# Patient Record
Sex: Male | Born: 1940 | Race: White | Hispanic: No | Marital: Single | Smoking: Former smoker
Health system: Southern US, Community
[De-identification: ages and names within clinical notes are randomized; demographics above are authoritative.]

## PROBLEM LIST (undated history)

## (undated) DIAGNOSIS — I639 Cerebral infarction, unspecified: Secondary | ICD-10-CM

## (undated) DIAGNOSIS — I10 Essential (primary) hypertension: Secondary | ICD-10-CM

## (undated) DIAGNOSIS — E785 Hyperlipidemia, unspecified: Secondary | ICD-10-CM

## (undated) DIAGNOSIS — E119 Type 2 diabetes mellitus without complications: Secondary | ICD-10-CM

## (undated) HISTORY — DX: Essential (primary) hypertension: I10

## (undated) HISTORY — DX: Hyperlipidemia, unspecified: E78.5

## (undated) HISTORY — DX: Cerebral infarction, unspecified: I63.9

---

## 1968-03-23 DIAGNOSIS — I639 Cerebral infarction, unspecified: Secondary | ICD-10-CM

## 1968-03-23 HISTORY — DX: Cerebral infarction, unspecified: I63.9

## 1978-03-23 HISTORY — PX: APPENDECTOMY: SHX54

## 2004-10-21 ENCOUNTER — Ambulatory Visit: Payer: Self-pay | Admitting: Gastroenterology

## 2004-10-30 ENCOUNTER — Ambulatory Visit: Payer: Self-pay | Admitting: Gastroenterology

## 2006-10-28 ENCOUNTER — Ambulatory Visit (HOSPITAL_BASED_OUTPATIENT_CLINIC_OR_DEPARTMENT_OTHER): Admission: RE | Admit: 2006-10-28 | Discharge: 2006-10-28 | Payer: Self-pay | Admitting: General Surgery

## 2006-10-28 ENCOUNTER — Encounter (INDEPENDENT_AMBULATORY_CARE_PROVIDER_SITE_OTHER): Payer: Self-pay | Admitting: General Surgery

## 2008-11-06 ENCOUNTER — Encounter: Payer: Self-pay | Admitting: Cardiovascular Disease

## 2008-11-13 ENCOUNTER — Encounter: Payer: Self-pay | Admitting: Cardiovascular Disease

## 2008-11-15 ENCOUNTER — Ambulatory Visit: Payer: Self-pay | Admitting: Cardiovascular Disease

## 2008-11-15 DIAGNOSIS — E782 Mixed hyperlipidemia: Secondary | ICD-10-CM

## 2008-11-15 DIAGNOSIS — R079 Chest pain, unspecified: Secondary | ICD-10-CM

## 2008-11-15 DIAGNOSIS — I10 Essential (primary) hypertension: Secondary | ICD-10-CM | POA: Insufficient documentation

## 2008-11-30 ENCOUNTER — Encounter: Payer: Self-pay | Admitting: Cardiovascular Disease

## 2008-11-30 ENCOUNTER — Ambulatory Visit: Payer: Self-pay

## 2008-12-14 ENCOUNTER — Encounter (INDEPENDENT_AMBULATORY_CARE_PROVIDER_SITE_OTHER): Payer: Self-pay | Admitting: *Deleted

## 2009-09-13 ENCOUNTER — Encounter (INDEPENDENT_AMBULATORY_CARE_PROVIDER_SITE_OTHER): Payer: Self-pay | Admitting: *Deleted

## 2009-09-18 ENCOUNTER — Encounter (INDEPENDENT_AMBULATORY_CARE_PROVIDER_SITE_OTHER): Payer: Self-pay | Admitting: *Deleted

## 2009-10-30 ENCOUNTER — Encounter (INDEPENDENT_AMBULATORY_CARE_PROVIDER_SITE_OTHER): Payer: Self-pay | Admitting: *Deleted

## 2009-11-01 ENCOUNTER — Ambulatory Visit: Payer: Self-pay | Admitting: Gastroenterology

## 2009-11-15 ENCOUNTER — Ambulatory Visit: Payer: Self-pay | Admitting: Gastroenterology

## 2009-11-18 ENCOUNTER — Encounter: Payer: Self-pay | Admitting: Gastroenterology

## 2010-04-22 NOTE — Letter (Signed)
Summary: Patient Notice- Polyp Results  [facility]  [address] [place], [place] [address]   Phone: [phone]  Fax: [phone]        November 18, 2009 MRN: [id number]    [facility] [address] [place], [place]  [address]    Dear Mr. BOLLIER,  I am pleased to inform you that the colon polyp(s) removed during your recent colonoscopy was (were) found to be benign (no cancer detected) upon pathologic examination.  I recommend you have a repeat colonoscopy examination in 5 years to look for recurrent polyps, as having colon polyps increases your risk for having recurrent polyps or even colon cancer in the future.  Should you develop new or worsening symptoms of abdominal pain, bowel habit changes or bleeding from the rectum or bowels, please schedule an evaluation with either your primary care physician or with me.  Continue treatment plan as outlined the day of your exam.  Please call us if you are having persistent problems or have questions about your condition that have not been fully answered at this time.  Sincerely,  Meryl Dare MD [facility]  This letter has been electronically signed by your physician.  Appended Document: Patient Notice- Polyp Results letter mailed 9.2.11

## 2010-04-22 NOTE — Letter (Signed)
Summary: [facility] Instructions  [facility]  [address] [place], [place] [address]   Phone: [phone]  Fax: [phone]       Steve Maldonado    [birth year]    MRN: [id number]        Procedure Day /Date:  Friday 11/15/2009     Arrival Time: 8:30 am      Procedure Time: 9:30 am     Location of Procedure:                    _x _  [facility] (4th Floor)                        PREPARATION FOR COLONOSCOPY WITH MOVIPREP   Starting 5 days prior to your procedure Sunday 8/21 do not eat nuts, seeds, popcorn, corn, beans, peas,  salads, or any raw vegetables.  Do not take any fiber supplements (e.g. Metamucil, Citrucel, and Benefiber).  THE DAY BEFORE YOUR PROCEDURE         DATE: Thursday 8/25  1.  Drink clear liquids the entire day-NO SOLID FOOD  2.  Do not drink anything colored red or purple.  Avoid juices with pulp.  No orange juice.  3.  Drink at least 64 oz. (8 glasses) of fluid/clear liquids during the day to prevent dehydration and help the prep work efficiently.  CLEAR LIQUIDS INCLUDE: Water Jello Ice Popsicles Tea (sugar ok, no milk/cream) Powdered fruit flavored drinks Coffee (sugar ok, no milk/cream) Gatorade Juice: apple, white grape, white cranberry  Lemonade Clear bullion, consomm, broth Carbonated beverages (any kind) Strained chicken noodle soup Hard Candy                             4.  In the morning, mix first dose of MoviPrep solution:    Empty 1 Pouch A and 1 Pouch B into the disposable container    Add lukewarm drinking water to the top line of the container. Mix to dissolve    Refrigerate (mixed solution should be used within 24 hrs)  5.  Begin drinking the prep at 5:00 p.m. The MoviPrep container is divided by 4 marks.   Every 15 minutes drink the solution down to the next mark (approximately 8 oz) until the full liter is complete.   6.  Follow completed prep with 16 oz of clear liquid of your choice (Nothing red  or purple).  Continue to drink clear liquids until bedtime.  7.  Before going to bed, mix second dose of MoviPrep solution:    Empty 1 Pouch A and 1 Pouch B into the disposable container    Add lukewarm drinking water to the top line of the container. Mix to dissolve    Refrigerate  THE DAY OF YOUR PROCEDURE      DATE: Friday 8/26  Beginning at 4:30 a.m. (5 hours before procedure):         1. Every 15 minutes, drink the solution down to the next mark (approx 8 oz) until the full liter is complete.  2. Follow completed prep with 16 oz. of clear liquid of your choice.    3. You may drink clear liquids until 7:30 am (2 HOURS BEFORE PROCEDURE).   MEDICATION INSTRUCTIONS  Unless otherwise instructed, you should take regular prescription medications with a small sip of water   as early as possible the morning of  your procedure.         OTHER INSTRUCTIONS  You will need a responsible adult at least 70 years of age to accompany you and drive you home.   This person must remain in the waiting room during your procedure.  Wear loose fitting clothing that is easily removed.  Leave jewelry and other valuables at home.  However, you may wish to bring a book to read or  an iPod/MP3 player to listen to music as you wait for your procedure to start.  Remove all body piercing jewelry and leave at home.  Total time from sign-in until discharge is approximately 2-3 hours.  You should go home directly after your procedure and rest.  You can resume normal activities the  day after your procedure.  The day of your procedure you should not:   Drive   Make legal decisions   Operate machinery   Drink alcohol   Return to work  You will receive specific instructions about eating, activities and medications before you leave.    The above instructions have been reviewed and explained to me by   _______________________    I fully understand and can verbalize these instructions  _____________________________ Date _________

## 2010-04-22 NOTE — Procedures (Signed)
Summary: Colonoscopy  Patient: Steve Maldonado Note: All result statuses are Final unless otherwise noted.  Tests: (1) Colonoscopy (COL)   COL Colonoscopy           DONE     [facility]     [address]     [place], [place]  [address]           COLONOSCOPY PROCEDURE REPORT           PATIENT:  Steve Maldonado, Steve Maldonado  MR#:  [id number]     BIRTHDATE:  [birth year], 69 yrs. old  GENDER:  male     ENDOSCOPIST:  Judie Petit T. Russella Dar, MD, [facility]           PROCEDURE DATE:  11/15/2009     PROCEDURE:  Colonoscopy with snare polypectomy     ASA CLASS:  Class II     INDICATIONS:  1) surveillance and high-risk screening  2)     follow-up of polyp, adenomatous polyp, 09/2001.     MEDICATIONS:   Fentanyl 75 mcg IV, Versed 8 mg IV     DESCRIPTION OF PROCEDURE:   After the risks benefits and     alternatives of the procedure were thoroughly explained, informed     consent was obtained.  Digital rectal exam was performed and     revealed no abnormalities.   The LB [id number]-[id number] endoscope     was introduced through the anus and advanced to the cecum, which     was identified by both the appendix and ileocecal valve, without     limitations.  The quality of the prep was excellent, using     MoviPrep.  The instrument was then slowly withdrawn as the colon     was fully examined.     <<PROCEDUREIMAGES>>     FINDINGS:  A sessile polyp was found in the distal transverse     colon. It was 6 mm in size. Polyp was snared without cautery.     Retrieval was successful. Mild diverticulosis was found in the     sigmoid colon. A normal appearing cecum, ileocecal valve, and     appendiceal orifice were identified. The ascending, hepatic     flexure, splenic flexure, descending colon, and rectum appeared     unremarkable. Retroflexed views in the rectum revealed internal     hemorrhoids, small.  The time to cecum =  4.75  minutes. The scope     was then withdrawn (time =  10.25  min) from the patient and the  procedure completed.           COMPLICATIONS:  None           ENDOSCOPIC IMPRESSION:     1) 6 mm sessile polyp in the distal transverse colon     2) Mild diverticulosis in the sigmoid colon     3) Internal hemorrhoids           RECOMMENDATIONS:     1) Await pathology results     2) High fiber diet with liberal fluid intake     3) Repeat Colonoscopy in 5 years pending pathology review           Malcolm T. Russella Dar, MD, [facility]           CC: Geoffry Paradise, MD           n.     Rosalie DoctorVenita Lick. Stark at 11/15/2009 10:11 AM  Steve Maldonado, Steve Maldonado, [id number]  Note: An exclamation mark (!) indicates a result that was not dispersed into the flowsheet. Document Creation Date: 11/15/2009 10:12 AM _______________________________________________________________________  (1) Order result status: Final Collection or observation date-time: 11/15/2009 10:03 Requested date-time:  Receipt date-time:  Reported date-time:  Referring Physician:   Ordering Physician: Claudette Head [phone]) Specimen Source:  Source: Launa Grill Order Number: [id number] Lab site:   Appended Document: Colonoscopy     Procedures Next Due Date:    Colonoscopy: 10/2014

## 2010-04-22 NOTE — Letter (Signed)
Summary: Colonoscopy Letter  [facility]  [address] [place], [place] [address]   Phone: [phone]  Fax: [phone]      September 13, 2009 MRN: [id number]   Steve Maldonado [address] [place], [place]  [address]   Dear [name],   According to your medical record, it is time for you to schedule a Colonoscopy. The American Cancer Society recommends this procedure as a method to detect early colon cancer. Patients with a family history of colon cancer, or a personal history of colon polyps or inflammatory bowel disease are at increased risk.  This letter has beeen generated based on the recommendations made at the time of your procedure. If you feel that in your particular situation this may no longer apply, please contact our office.  Please call our office at [phone] to schedule this appointment or to update your records at your earliest convenience.  Thank you for cooperating with [place] to provide you with the very best care possible.   Sincerely,  Judie Petit T. Russella Dar, M.D.  [facility] [phone]

## 2010-04-22 NOTE — Miscellaneous (Signed)
Summary: [facility]  Clinical Lists Changes  Medications: Added new medication of MOVIPREP 100 GM  SOLR (PEG-KCL-NACL-NASULF-NA ASC-C) As per prep instructions. - Signed Rx of MOVIPREP 100 GM  SOLR (PEG-KCL-NACL-NASULF-NA ASC-C) As per prep instructions.;  #1 x 0;  Signed;  Entered by: Ezra Sites RN;  Authorized by: Meryl Dare MD [facility];  Method used: Print then Give to Patient Observations: Added new observation of NKA: T (11/01/2009 8:09)    Prescriptions: MOVIPREP 100 GM  SOLR (PEG-KCL-NACL-NASULF-NA ASC-C) As per prep instructions.  #1 x 0   Entered by:   Ezra Sites RN   Authorized by:   Meryl Dare MD [facility]   Signed by:   Ezra Sites RN on 11/01/2009   Method used:   Print then Give to Patient   RxID:   [id number]

## 2010-04-22 NOTE — Letter (Signed)
Summary: Previsit letter  [facility]  [address] [place], [place] [address]   Phone: [phone]  Fax: [phone]       09/18/2009 MRN: [id number]  Steve Maldonado [address] [place], [place]  [address]  Dear [name],  Welcome to the Gastroenterology Division at [facility].    You are scheduled to see a nurse for your pre-procedure visit on 11-01-09 at 8:30a.m. on the 3rd floor at [facility], [address]  We ask that you try to arrive at our office 15 minutes prior to your appointment time to allow for check-in.  Your nurse visit will consist of discussing your medical and surgical history, your immediate family medical history, and your medications.    Please bring a complete list of all your medications or, if you prefer, bring the medication bottles and we will list them.  We will need to be aware of both prescribed and over the counter drugs.  We will need to know exact dosage information as well.  If you are on blood thinners (Coumadin, Plavix, Aggrenox, Ticlid, etc.) please call our office today/prior to your appointment, as we need to consult with your physician about holding your medication.   Please be prepared to read and sign documents such as consent forms, a financial agreement, and acknowledgement forms.  If necessary, and with your consent, a friend or relative is welcome to sit-in on the nurse visit with you.  Please bring your insurance card so that we may make a copy of it.  If your insurance requires a referral to see a specialist, please bring your referral form from your primary care physician.  No co-pay is required for this nurse visit.     If you cannot keep your appointment, please call [phone] to cancel or reschedule prior to your appointment date.  This allows [place] the opportunity to schedule an appointment for another patient in need of care.    Thank you for choosing  Gastroenterology for your medical needs.  We  appreciate the opportunity to care for you.  Please visit [place] at our website  to learn more about our practice.                     Sincerely.                                                                                                                   The Gastroenterology Division

## 2010-08-05 NOTE — Op Note (Signed)
NAMECARLITO, Steve Maldonado                 ACCOUNT NO.:  [id number]   MEDICAL RECORD NO.:  [id number]          PATIENT TYPE:  AMB   LOCATION:  DSC                          FACILITY:  [facility]   PHYSICIAN:  Gabrielle Dare. Janee Morn, M.D.DATE OF BIRTH:  Aug 03, 1940   DATE OF PROCEDURE:  10/28/2006  DATE OF DISCHARGE:                               OPERATIVE REPORT   PREOPERATIVE DIAGNOSIS:  Nevus, left scalp.   POSTOPERATIVE DIAGNOSIS:  Nevus, left scalp.   PROCEDURE:  Excision of nevus, left scalp.   SURGEON:  Gabrielle Dare. Janee Morn, MD   ANESTHESIA:  MAC.   HISTORY OF PRESENT ILLNESS:  [name] is a 70 year old gentleman who I  evaluated in the office for a 1.5-cm nevus on his left scalp.  He  presents today for elective excision.   PROCEDURE IN DETAIL:  Informed consent was obtained.  The patient's site  was marked.  He received intravenous antibiotics.  He was brought to the  operating room.  MAC anesthesia was administered by the anesthesia  staff.  His hair was clipped around the nevus.  The area was prepped and  draped in a sterile fashion.  First the area was infiltrated with a  mixture of 1% lidocaine with epinephrine and 0.25% Marcaine.  An  elliptical incision was then made to encompass the entire nevus.  The  subcutaneous tissues were dissected down and the nevus was excised in  one piece.  It was oriented with sutures for pathology and sent fresh.  The wound was copiously irrigated.  Some additional local anesthetic was  injected.  Meticulous hemostasis was obtained with the Bovie cautery.  The wound was then closed primarily with interrupted 3-0 nylon sutures.  Excellent hemostasis was obtained.  The patient tolerated the procedure  well without apparent complication and was taken to the recovery room in  stable condition.      Gabrielle Dare Janee Morn, M.D.  Electronically Signed     BET/MEDQ  D:  10/28/2006  T:  10/28/2006  Job:  [id number]   cc:   Geoffry Paradise, M.D.

## 2011-01-05 LAB — POCT HEMOGLOBIN-HEMACUE
Hemoglobin: 14.8
Operator id: 208731

## 2013-06-08 ENCOUNTER — Ambulatory Visit (INDEPENDENT_AMBULATORY_CARE_PROVIDER_SITE_OTHER)
Admission: RE | Admit: 2013-06-08 | Discharge: 2013-06-08 | Disposition: A | Discharge status: Home / Self Care | Payer: Medicare Other | Source: Ambulatory Visit | Attending: Internal Medicine | Admitting: Internal Medicine

## 2013-06-08 ENCOUNTER — Encounter: Payer: Self-pay | Admitting: Internal Medicine

## 2013-06-08 ENCOUNTER — Ambulatory Visit (INDEPENDENT_AMBULATORY_CARE_PROVIDER_SITE_OTHER): Payer: Medicare Other | Admitting: Internal Medicine

## 2013-06-08 VITALS — BP 130/86 | HR 75 | Temp 98.3°F | Ht 75.0 in | Wt 194.0 lb

## 2013-06-08 DIAGNOSIS — R059 Cough, unspecified: Secondary | ICD-10-CM

## 2013-06-08 DIAGNOSIS — R05 Cough: Secondary | ICD-10-CM

## 2013-06-08 DIAGNOSIS — R058 Other specified cough: Secondary | ICD-10-CM | POA: Insufficient documentation

## 2013-06-08 MED ORDER — PREDNISONE 10 MG PO TABS: ORAL_TABLET | ORAL | Status: DC

## 2013-06-08 MED ORDER — TRAMADOL HCL 50 MG PO TABS: ORAL_TABLET | ORAL | Status: DC

## 2013-06-08 NOTE — Patient Instructions (Signed)
Try prilosec 20mg   Take 30-60 min before first meal of the day and Pepcid 20 mg one bedtime until cough is completely gone for at least a week without the need for cough suppression    Take delsym (otc)  two tsp every 12 hours and supplement if needed with  tramadol 50 mg up to 1 every 4 hours to suppress the urge to cough. Swallowing water or using ice chips/non mint and menthol containing candies (such as lifesavers or sugarless jolly ranchers) are also effective.  You should rest your voice and avoid activities that you know make you cough.  Once you have eliminated the cough for 3 straight days try reducing the tramadol first,  then the delsym as tolerated.    Prednisone 10 mg take  4 each am x 2 days,   2 each am x 2 days,  1 each am x 2 days and stop   GERD (REFLUX)  is an extremely common cause of respiratory symptoms, many times with no significant heartburn at all.    It can be treated with medication, but also with lifestyle changes including avoidance of late meals, excessive alcohol, smoking cessation, and avoid fatty foods, chocolate, peppermint, colas, red wine, and acidic juices such as orange juice.  NO MINT OR MENTHOL PRODUCTS SO NO COUGH DROPS  USE SUGARLESS CANDY INSTEAD (jolley ranchers or Stover's)  NO OIL BASED VITAMINS - use powdered substitutes.    Please remember to go to the x-ray department downstairs for your tests - we will call you with the results when they are available.

## 2013-06-08 NOTE — Assessment & Plan Note (Signed)
Explained natural history of uri and why it's necessary in patients at risk to treat GERD aggressively  at least  short term   to reduce risk of evolving cyclical cough initially  triggered by epithelial injury and a heightened sensitivty to the effects of any upper airway irritants,  most importantly acid - related.  That is, the more sensitive the epithelium damaged for virus, the more the cough, the more the secondary reflux (especially in those prone to reflux) the more the irritation of the sensitive mucosa and so on in a cyclical pattern.  See instructions for specific recommendations which were reviewed directly with the patient who was given a copy with highlighter outlining the key components.  

## 2013-06-08 NOTE — Progress Notes (Signed)
Subjective:    Patient ID: Steve Maldonado, male    DOB: Dec 08, 1940  MRN: [id number]  HPI   37 yowm quit smoking 1980 no resp problems until around 1st of year 2015 bad cold otc never saw a doctor but the cough persisted and referred 06/08/2013 by his neighber, Dr Rebekah Chesterfield to pulmonary clinic.    06/08/2013 1st [facility] office visit/ Steve Maldonado  Chief Complaint  Patient presents with  . Pulmonary Consult    Self referral- pt c/o cough since Jan 2014-prod with minimal clear sputum.   cough acutely sick with cold Jan 2015 never really productive starts up after stirs, sometimes wakes him up but usually not.       Kouffman Reflux v Neurogenic Cough Differentiator Reflux Comments  Do you awaken from a sound sleep coughing violently?                            With trouble breathing? no   Do you have choking episodes when you cannot  Get enough air, gasping for air ?              no   Do you usually cough when you lie down into  The bed, or when you just lie down to rest ?                          no   Do you usually cough after meals or eating?         no   Do you cough when (or after) you bend over?    no   GERD SCORE     Kouffman Reflux v Neurogenic Cough Differentiator Neurogenic   Do you more-or-less cough all day long? yes   Does change of temperature make you cough? no   Does laughing or chuckling cause you to cough? no   Do fumes (perfume, automobile fumes, burned  Toast, etc.,) cause you to cough ?      no   Does speaking, singing, or talking on the phone cause you to cough   ?               No    Neurogenic/Airway score      No obvious other patterns in day to day or daytime variabilty or assoc sob or cp or chest tightness, subjective wheeze overt sinus or hb symptoms. No unusual exp hx or h/o childhood pna/ asthma or knowledge of premature birth.  Sleeping ok without nocturnal  or early am exacerbation  of respiratory  c/o's or need for noct saba. Also denies any obvious  fluctuation of symptoms with weather or environmental changes or other aggravating or alleviating factors except as outlined above   Current Medications, Allergies, Complete Past Medical History, Past Surgical History, Family History, and Social History were reviewed in [employer] record.          Review of Systems  Constitutional: Negative for fever, chills, activity change, appetite change and unexpected weight change.  HENT: Negative for congestion, dental problem, postnasal drip, rhinorrhea, sneezing, sore throat, trouble swallowing and voice change.   Eyes: Negative for visual disturbance.  Respiratory: Positive for cough and shortness of breath. Negative for choking.   Cardiovascular: Negative for chest pain and leg swelling.  Gastrointestinal: Negative for nausea, vomiting and abdominal pain.  Genitourinary: Negative for difficulty urinating.  Musculoskeletal: Negative for arthralgias.  Skin: Negative for rash.  Psychiatric/Behavioral: Negative for behavioral problems and confusion.       Objective:   Physical Exam  amb wm nad  Wt Readings from Last 3 Encounters:  06/08/13 194 lb (87.998 kg)  11/15/08 201 lb (91.173 kg)     HEENT: nl dentition, turbinates, and orophanx. Nl external ear canals without cough reflex   NECK :  without JVD/Nodes/TM/ nl carotid upstrokes bilaterally   LUNGS: no acc muscle use, clear to A and P bilaterally without cough on insp or exp maneuvers   CV:  RRR  no s3 or murmur or increase in P2, no edema   ABD:  soft and nontender with nl excursion in the supine position. No bruits or organomegaly, bowel sounds nl  MS:  warm without deformities, calf tenderness, cyanosis or clubbing  SKIN: warm and dry without lesions    NEURO:  alert, approp, no deficits      CXR  06/08/2013 :  1. Subtle accentuation of the interstitial markings of both lung bases laterally which could represent interstitial lung disease. 2.  Hyperinflated lungs which could represent emphysema. My review : nsa       Assessment & Plan:

## 2013-06-09 NOTE — Progress Notes (Signed)
Quick Note:  LMTCB ______ 

## 2013-06-09 NOTE — Progress Notes (Signed)
Quick Note:  Spoke with pt and notified of results per Dr. Wert. Pt verbalized understanding and denied any questions.  ______ 

## 2014-05-21 ENCOUNTER — Encounter: Payer: Self-pay | Admitting: Internal Medicine

## 2014-05-21 ENCOUNTER — Ambulatory Visit (INDEPENDENT_AMBULATORY_CARE_PROVIDER_SITE_OTHER): Payer: Medicare Other | Admitting: Internal Medicine

## 2014-05-21 VITALS — BP 138/88 | HR 84 | Ht 75.0 in | Wt 198.0 lb

## 2014-05-21 DIAGNOSIS — R058 Other specified cough: Secondary | ICD-10-CM

## 2014-05-21 DIAGNOSIS — R05 Cough: Secondary | ICD-10-CM

## 2014-05-21 MED ORDER — TRAMADOL HCL 50 MG PO TABS: ORAL_TABLET | ORAL | Status: DC

## 2014-05-21 MED ORDER — PREDNISONE 10 MG PO TABS: ORAL_TABLET | ORAL | Status: DC

## 2014-05-21 NOTE — Assessment & Plan Note (Signed)
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  Explained the natural history of uri and why it's necessary in patients at risk to treat GERD aggressively - at least  short term -   to reduce risk of evolving cyclical cough initially  triggered by epithelial injury and a heightened sensitivty to the effects of any upper airway irritants,  most importantly acid - related - then perpetuated by epithelial injury related to the cough itself as the upper airway collapses on itself.  That is, the more sensitive the epithelium becomes once it is damaged by the virus, the more the ensuing irritability> the more the cough, the more the secondary reflux (especially in those prone to reflux) the more the irritation of the sensitive mucosa and so on in a  Classic cyclical pattern.    See instructions for specific recommendations which were reviewed directly with the patient who was given a copy with highlighter outlining the key components.

## 2014-05-21 NOTE — Progress Notes (Signed)
Subjective:    Patient ID: Steve Maldonado, male    DOB: [birth year]  MRN: [id number]     Brief patient profile:  83 yowm quit smoking 1980 no resp problems until around 1st of year 2015 bad cold otc never saw a doctor but the cough persisted and referred 06/08/2013 by his neighber, Dr Rebekah Chesterfield to pulmonary clinic.    06/08/2013 1st [facility] office visit/ Lucelia Lacey  Chief Complaint  Patient presents with  . Pulmonary Consult    Self referral- pt c/o cough since Jan 2014-prod with minimal clear sputum.   cough acutely sick with cold Jan 2015 never really productive starts up after stirs, sometimes wakes him up but usually not.   rec Try prilosec 20mg   Take 30-60 min before first meal of the day and Pepcid 20 mg one bedtime until cough is completely gone for at least a week without the need for cough suppression Take delsym (otc)  two tsp every 12 hours and supplement if needed with  tramadol 50 mg up to 1 every 4 hours to suppress the urge to cough. Swallowing water or using ice chips/non mint and menthol containing candies (such as lifesavers or sugarless jolly ranchers) are also effective.  You should rest your voice and avoid activities that you know make you cough. Once you have eliminated the cough for 3 straight days try reducing the tramadol first,  then the delsym as tolerated.   Prednisone 10 mg take  4 each am x 2 days,   2 each am x 2 days,  1 each am x 2 days and stop  GERD diet   05/21/2014 f/u ov/Saloni Lablanc re: recurrent cough  Chief Complaint  Patient presents with  . Acute Visit    Pt c/o increased cough x 10 days- occ prod with minimal white sputum.  He also c/o SOB occ and notices "chest rattle".    100% improved p last ov then typical uri with sinus congestion/ improved s abx but cough lingering just like before  Cough worse p asleep  No obvious day to day or daytime variabilty or assoc sob  or cp or chest tightness, subjective wheeze overt  or hb symptoms. No unusual exp hx  or h/o childhood pna/ asthma or knowledge of premature birth.  Sleeping ok without nocturnal  or early am exacerbation  of respiratory  c/o's or need for noct saba. Also denies any obvious fluctuation of symptoms with weather or environmental changes or other aggravating or alleviating factors except as outlined above   Current Medications, Allergies, Complete Past Medical History, Past Surgical History, Family History, and Social History were reviewed in [employer] record.  ROS  The following are not active complaints unless bolded sore throat, dysphagia, dental problems, itching, sneezing,  nasal congestion or excess/ purulent secretions, ear ache,   fever, chills, sweats, unintended wt loss, pleuritic or exertional cp, hemoptysis,  orthopnea pnd or leg swelling, presyncope, palpitations, heartburn, abdominal pain, anorexia, nausea, vomiting, diarrhea  or change in bowel or urinary habits, change in stools or urine, dysuria,hematuria,  rash, arthralgias, visual complaints, headache, numbness weakness or ataxia or problems with walking or coordination,  change in mood/affect or memory.                         Objective:   Physical Exam  amb wm nad  Wt Readings from Last 3 Encounters:  05/21/14 198 lb (89.812 kg)  06/08/13 194  lb (87.998 kg)  11/15/08 201 lb (91.173 kg)    Vital signs reviewed     HEENT: nl dentition, turbinates, and orophanx. Nl external ear canals without cough reflex   NECK :  without JVD/Nodes/TM/ nl carotid upstrokes bilaterally   LUNGS: no acc muscle use, clear to A and P bilaterally without cough on insp or exp maneuvers   CV:  RRR  no s3 or murmur or increase in P2, no edema   ABD:  soft and nontender with nl excursion in the supine position. No bruits or organomegaly, bowel sounds nl  MS:  warm without deformities, calf tenderness, cyanosis or clubbing  SKIN: warm and dry without lesions    NEURO:  alert, approp, no  deficits      CXR  06/08/2013 :  1. Subtle accentuation of the interstitial markings of both lung bases laterally which could represent interstitial lung disease. 2. Hyperinflated lungs which could represent emphysema. My review : nsa       Assessment & Plan:

## 2014-05-21 NOTE — Patient Instructions (Addendum)
Try prilosec 20mg   Take 30-60 min before first meal of the day and Pepcid 20 mg one bedtime until cough is completely gone for at least a week without the need for cough suppression (both are over the counter)    Take delsym (otc)  two tsp every 12 hours and supplement if needed with  tramadol 50 mg up to 1 every 4 hours to suppress the urge to cough. Swallowing water or using ice chips/non mint and menthol containing candies (such as lifesavers or sugarless jolly ranchers) are also effective.  You should rest your voice and avoid activities that you know make you cough.  Once you have eliminated the cough for 3 straight days try reducing the tramadol first,  then the delsym as tolerated.    Prednisone 10 mg take  4 each am x 2 days,   2 each am x 2 days,  1 each am x 2 days and stop (called in)   GERD (REFLUX)  is an extremely common cause of respiratory symptoms, many times with no significant heartburn at all.    It can be treated with medication, but also with lifestyle changes including avoidance of late meals, excessive alcohol, smoking cessation, and avoid fatty foods, chocolate, peppermint, colas, red wine, and acidic juices such as orange juice.  NO MINT OR MENTHOL PRODUCTS SO NO COUGH DROPS  USE SUGARLESS CANDY INSTEAD (jolley ranchers or Stover's)  NO OIL BASED VITAMINS - use powdered substitutes.    If not 100% better in 2 weeks return to clinic

## 2014-09-17 ENCOUNTER — Encounter: Payer: Self-pay | Admitting: Gastroenterology

## 2014-09-20 ENCOUNTER — Encounter: Payer: Self-pay | Admitting: Gastroenterology

## 2014-10-25 ENCOUNTER — Encounter: Payer: Self-pay | Admitting: Gastroenterology

## 2014-11-15 ENCOUNTER — Ambulatory Visit (AMBULATORY_SURGERY_CENTER): Payer: Self-pay | Admitting: *Deleted

## 2014-11-15 VITALS — Ht 75.0 in | Wt 192.0 lb

## 2014-11-15 DIAGNOSIS — Z86010 Personal history of colonic polyps: Secondary | ICD-10-CM

## 2014-11-15 MED ORDER — NA SULFATE-K SULFATE-MG SULF 17.5-3.13-1.6 GM/177ML PO SOLN: ORAL | Status: DC

## 2014-11-15 NOTE — Progress Notes (Signed)
No allergies to eggs or soy. No problems with anesthesia.  Pt given Emmi instructions for colonoscopy  No oxygen use  No diet drug use  

## 2014-11-29 ENCOUNTER — Ambulatory Visit (AMBULATORY_SURGERY_CENTER): Payer: Medicare Other | Admitting: Gastroenterology

## 2014-11-29 ENCOUNTER — Encounter: Payer: Self-pay | Admitting: Gastroenterology

## 2014-11-29 VITALS — BP 132/47 | HR 60 | Temp 95.9°F | Resp 16 | Ht 75.0 in | Wt 192.0 lb

## 2014-11-29 DIAGNOSIS — Z86010 Personal history of colonic polyps: Secondary | ICD-10-CM | POA: Diagnosis present

## 2014-11-29 DIAGNOSIS — D123 Benign neoplasm of transverse colon: Secondary | ICD-10-CM | POA: Diagnosis not present

## 2014-11-29 MED ORDER — SODIUM CHLORIDE 0.9 % IV SOLN
500.0000 mL | INTRAVENOUS | Status: DC
Start: 2014-11-29 — End: 2014-11-29

## 2014-11-29 NOTE — Patient Instructions (Signed)
YOU HAD AN ENDOSCOPIC PROCEDURE TODAY AT THE [facility]:   Refer to the procedure report that was given to you for any specific questions about what was found during the examination.  If the procedure report does not answer your questions, please call your gastroenterologist to clarify.  If you requested that your care partner not be given the details of your procedure findings, then the procedure report has been included in a sealed envelope for you to review at your convenience later.  YOU SHOULD EXPECT: Some feelings of bloating in the abdomen. Passage of more gas than usual.  Walking can help get rid of the air that was put into your GI tract during the procedure and reduce the bloating. If you had a lower endoscopy (such as a colonoscopy or flexible sigmoidoscopy) you may notice spotting of blood in your stool or on the toilet paper. If you underwent a bowel prep for your procedure, you may not have a normal bowel movement for a few days.  Please Note:  You might notice some irritation and congestion in your nose or some drainage.  This is from the oxygen used during your procedure.  There is no need for concern and it should clear up in a day or so.  SYMPTOMS TO REPORT IMMEDIATELY:   Following lower endoscopy (colonoscopy or flexible sigmoidoscopy):  Excessive amounts of blood in the stool  Significant tenderness or worsening of abdominal pains  Swelling of the abdomen that is new, acute  Fever of 100F or higher   Following upper endoscopy (EGD)  Vomiting of blood or coffee ground material  New chest pain or pain under the shoulder blades  Painful or persistently difficult swallowing  New shortness of breath  Fever of 100F or higher  Black, tarry-looking stools  For urgent or emergent issues, a gastroenterologist can be reached at any hour by calling [phone].   DIET: Your first meal following the procedure should be a small meal and then it is ok to progress to  your normal diet. Heavy or fried foods are harder to digest and may make you feel nauseous or bloated.  Likewise, meals heavy in dairy and vegetables can increase bloating.  Drink plenty of fluids but you should avoid alcoholic beverages for 24 hours.  ACTIVITY:  You should plan to take it easy for the rest of today and you should NOT DRIVE or use heavy machinery until tomorrow (because of the sedation medicines used during the test).    FOLLOW UP: Our staff will call the number listed on your records the next business day following your procedure to check on you and address any questions or concerns that you may have regarding the information given to you following your procedure. If we do not reach you, we will leave a message.  However, if you are feeling well and you are not experiencing any problems, there is no need to return our call.  We will assume that you have returned to your regular daily activities without incident.  If any biopsies were taken you will be contacted by phone or by letter within the next 1-3 weeks.  Please call us at [phone] if you have not heard about the biopsies in 3 weeks.    SIGNATURES/CONFIDENTIALITY: You and/or your care partner have signed paperwork which will be entered into your electronic medical record.  These signatures attest to the fact that that the information above on your After Visit Summary has been reviewed   and is understood.  Full responsibility of the confidentiality of this discharge information lies with you and/or your care-partner.    Handouts were given to your care partner on polyps, hemorrhoids, diverticulosis, and a high fiber diet with liberal fluid intake You may resume your current medications today. Await biopsy results. Please call if any questions or concerns.

## 2014-11-29 NOTE — Progress Notes (Signed)
Transferred to recovery room. A/O x3, pleased with MAC.  VSS.  Report to Annette, RN. 

## 2014-11-29 NOTE — Op Note (Signed)
[facility]  [address] [place], [address]   COLONOSCOPY PROCEDURE REPORT  PATIENT: Steve, Maldonado  MR#: [id number] BIRTHDATE: 1940-06-02 , 12  yrs. old GENDER: male ENDOSCOPIST: Ladene Artist, MD, [facility] REFERRED SW:NIOEVOJ Reynaldo Minium, M.D. PROCEDURE DATE:  11/29/2014 PROCEDURE:   Colonoscopy, surveillance and Colonoscopy with biopsy First Screening Colonoscopy - Avg.  risk and is 50 yrs.  old or older - No.  Prior Negative Screening - Now for repeat screening. N/A  History of Adenoma - Now for follow-up colonoscopy & has been > or = to 3 yrs.  Yes hx of adenoma.  Has been 3 or more years since last colonoscopy.  Polyps removed today? Yes ASA CLASS:   Class II INDICATIONS:Surveillance due to prior colonic neoplasia and PH Colon Adenoma. MEDICATIONS: Monitored anesthesia care and Propofol 300 mg IV DESCRIPTION OF PROCEDURE:   After the risks benefits and alternatives of the procedure were thoroughly explained, informed consent was obtained.  The digital rectal exam revealed no abnormalities of the rectum.   The LB [id number]  endoscope was introduced through the anus and advanced to the cecum, which was identified by both the appendix and ileocecal valve. No adverse events experienced with a tortuous and redundant colon.   The quality of the prep was excellent.  (Suprep was used)  The instrument was then slowly withdrawn as the colon was fully examined. Estimated blood loss is zero unless otherwise noted in this procedure report.   COLON FINDINGS: A sessile polyp measuring 5 mm in size was found in the transverse colon.  A polypectomy was performed with cold forceps.  The resection was complete, the polyp tissue was completely retrieved and sent to histology.   There was mild diverticulosis noted in the sigmoid colon.   The examination was otherwise normal.  Retroflexed views revealed internal Grade I hemorrhoids. The time to cecum = 5.0 Withdrawal time  = 13.7   The scope was withdrawn and the procedure completed. COMPLICATIONS: There were no immediate complications.  ENDOSCOPIC IMPRESSION: 1.   Sessile polyp in the transverse colon; polypectomy performed with cold forceps 2.   Mild diverticulosis in the sigmoid colon 3.   Grade l internal hemorrhoids  RECOMMENDATIONS: 1.  Await pathology results 2.  High fiber diet with liberal fluid intake. 3.  Given your age, you will not need another colonoscopy for colon cancer screening or polyp surveillance.  These types of tests usually stop around age 3.  eSigned:  Ladene Artist, MD, [facility] 11/29/2014 8:32 AM

## 2014-11-29 NOTE — Progress Notes (Signed)
Called to room to assist during endoscopic procedure.  Patient ID and intended procedure confirmed with present staff. Received instructions for my participation in the procedure from the performing physician.  

## 2014-11-29 NOTE — Progress Notes (Signed)
No problems noted in the recovery room. maw 

## 2014-12-03 ENCOUNTER — Telehealth: Payer: Self-pay

## 2014-12-03 NOTE — Telephone Encounter (Signed)
Left a message at [phone] for the pt to call if any questions or concerns. maw

## 2014-12-04 ENCOUNTER — Encounter: Payer: Self-pay | Admitting: Gastroenterology

## 2016-12-06 DIAGNOSIS — R0602 Shortness of breath: Secondary | ICD-10-CM | POA: Diagnosis present

## 2016-12-06 DIAGNOSIS — R0609 Other forms of dyspnea: Secondary | ICD-10-CM

## 2016-12-06 NOTE — H&P (Signed)
OFFICE VISIT NOTES COPIED TO EPIC FOR DOCUMENTATION  . History of Present Illness Laverda Page MD; 12-28-16 9:20 PM) Patient words: Last O/V 11/19/2016; F/U nuc & echo for Sob, Htn, DM.  The patient is a 76 year old male who presents for a Follow-up for Dyspnea. Patient was initially referred to me for evaluation and management of worsening dyspnea on exertion. I had seen him on 11/19/2016, underwent nuclear stress test and echocardiogram and presents here for follow-up.  He has past medical history of diet controlled type 2 diabetes, hypertension, hyperlipidemia, chronic kidney disease stage III, diverticulosis, and osteoarthritis. He reports he first noticed shortness of breath one year ago and over the last year has progressively gotten worse. Dyspena is present any time he tries to excert himself such as; mowing the yard or climbing steps. Denies any chest pain, leg swelling, PND, or orthopnea. He reports that diabetes, hypertension, ahd hyperlipidemia has been well controlled.   He does walk generally everyday for 30 minutes in the morning and in the afternoon and has noticed that he is winded while doing this. Denies any claudication symptoms. He is a former smoker that quit approximatley 30 years ago. No change in syptoms.   Problem List/Past Medical Santiago Glad Coughlin; 12/28/2016 10:31 AM) Shortness of breath on exertion (R06.02)  Laboratory examination (Z01.89)  11/12/2015: Creatinine 1.2, EGFR 58, potassium 4.3, CMP otherwise normal. CBC normal. Cholesterol 157, triglycerides 99, HDL 47, LDL 90. Benign essential hypertension (I10)  EKG 11/19/2016: Sinus rhythm at rate of 81 bpm, normal axis. No evidence of ischemia, normal EKG. Hypercholesteremia (E78.00)  Dyspnea on exertion (R06.09)  Echocardiogram 11/19/2016: Left ventricle cavity is normal in size. Mild concentric hypertrophy of the left ventricle. Normal global wall motion. Grade II diastolic dysfunction Calculated EF 55%.  Left atrial cavity is mildly dilated. Mild to moderate mitral regurgitation. Mild tricuspid regurgitation. Pulmonary artery systolic pressure is estimated at 25-30 mm Hg. Controlled type 2 diabetes mellitus without complication, without long-term current use of insulin (E11.9)   Allergies Santiago Glad Coughlin; 2016/12/28 10:31 AM) No Known Drug Allergies [11/19/2016]:  Family History Pola Corn; 12-28-16 10:31 AM) Mother  Deceased. in her 65's from Vaginal Cancer; No known Heart conditions Father  Deceased. in his 56's from Kidney Failure; No known Heart conditions Sister 1  Older; No known Heart conditions  Social History Pola Corn; 28-Dec-2016 10:31 AM) Current tobacco use  Former smoker. Quit 30 Years ago Alcohol Use  Occasional alcohol use. Marital status  Single. Number of Children  0. Living Situation  Lives alone.  Past Surgical History Pola Corn; 12-28-16 10:31 AM) Appendectomy [1990]:  Medication History Pola Corn; 2016/12/28 10:36 AM) Losartan Potassium (50MG Tablet, 1 Oral daily) Active. Janumet XR (100-1000MG Tablet ER 24HR, 1 Oral daily) Active. AmLODIPine Besylate (2.5MG Tablet, 1 Oral daily) Active. Simvastatin (20MG Tablet, 1 Oral daily) Active. HydroCHLOROthiazide (25MG Tablet, 1 Oral daily) Active. Aspirin EC (81MG Tablet DR, 1 Oral daily) Active. Multiple Vitamin (1 (one) Oral daily) Active. Medications Reconciled (Verbally per pt)  Diagnostic Studies History Santiago Glad Coughlin; December 28, 2016 10:30 AM) Echocardiogram [11/19/2016]: Left ventricle cavity is normal in size. Mild concentric hypertrophy of the left ventricle. Normal global wall motion. Grade II diastolic dysfunction Calculated EF 55%. Left atrial cavity is mildly dilated. Mild to moderate mitral regurgitation. Mild tricuspid regurgitation. Pulmonary artery systolic pressure is estimated at 25-30 mm Hg. Nuclear stress test [11/20/2016]: 1. The resting electrocardiogram  demonstrated normal sinus rhythm, normal resting conduction and no resting arrhythmias. Cannot exclude  inferior infarct and anteerior infarct, old. The stress electrocardiogram was negative for ischemia. There were occasional PVC. Patient exercised on Bruce protocol for 5:00 minutes and achieved 6.06 METS. Stress test terminated due to dyspnea and 99% MPHR achieved (Target HR >85%). 2. 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 There is hypokinesis of the basal inferior, basal inferolateral, mid inferior, mid inferolateral, apical inferior and apical lateral myocardial wall(s). The left ventricular ejection fraction was calculated or visually estimated to be 35%. This is an high risk study.    Review of Systems Laverda Page, MD; 11/30/2016 9:16 PM) General Not Present- Appetite Loss and Weight Gain. Respiratory Not Present- Chronic Cough and Wakes up from Sleep Wheezing or Short of Breath. Cardiovascular Present- Difficulty Breathing On Exertion. Not Present- Chest Pain, Claudications, Difficulty Breathing Lying Down and Edema. Gastrointestinal Not Present- Black, Tarry Stool and Difficulty Swallowing. Musculoskeletal Present- Joint Pain (bilateral knees from osteoarthritis). Not Present- Decreased Range of Motion and Muscle Atrophy. Neurological Not Present- Attention Deficit. Psychiatric Not Present- Personality Changes and Suicidal Ideation. Endocrine Not Present- Cold Intolerance and Heat Intolerance. Hematology Not Present- Abnormal Bleeding. All other systems negative  Vitals Santiago Glad Coughlin; 11/30/2016 10:39 AM) 11/30/2016 10:33 AM Weight: 189 lb Pulse: 66 (Regular)  P.OX: 97% (Room air) BP: 117/66 (Sitting, Left Arm, Standard)       Physical Exam Laverda Page, MD; 11/30/2016 9:16 PM) General Mental  Status-Alert. General Appearance-Cooperative and Appears stated age. Build & Nutrition-Moderately built.  Head and Neck Thyroid Gland Characteristics - normal size and consistency and no palpable nodules.  Chest and Lung Exam Chest and lung exam reveals -quiet, even and easy respiratory effort with no use of accessory muscles, non-tender and on auscultation, normal breath sounds, no adventitious sounds.  Cardiovascular Cardiovascular examination reveals -normal heart sounds, regular rate and rhythm with no murmurs, carotid auscultation reveals no bruits, abdominal aorta auscultation reveals no bruits and no prominent pulsation, femoral artery auscultation bilaterally reveals normal pulses, no bruits, no thrills, normal pedal pulses bilaterally and no digital clubbing, cyanosis, edema, increased warmth or tenderness.  Abdomen Palpation/Percussion Normal exam - Non Tender and No hepatosplenomegaly.  Neurologic Neurologic evaluation reveals -alert and oriented x 3 with no impairment of recent or remote memory. Motor-Grossly intact without any focal deficits.  Musculoskeletal Global Assessment Left Lower Extremity - no deformities, masses or tenderness, no known fractures. Right Lower Extremity - no deformities, masses or tenderness, no known fractures.    Assessment & Plan Laverda Page MD; 11/30/2016 9:16 PM) Abnormal nuclear stress test (R94.39) Current Plans Started Metoprolol Succinate ER 25MG, 1 (one) Tablet daily, #30, 11/30/2016, Ref. x1. Local Order: Discontinue HCTZ METABOLIC PANEL, BASIC (79892) CBC & PLATELETS (AUTO) (11941) PT (PROTHROMBIN TIME) (74081) Dyspnea on exertion (R06.09) Story: Echocardiogram 11/19/2016: Left ventricle cavity is normal in size. Mild concentric hypertrophy of the left ventricle. Normal global wall motion. Grade II diastolic dysfunction Calculated EF 55%. Left atrial cavity is mildly dilated. Mild to moderate mitral  regurgitation. Mild tricuspid regurgitation. Pulmonary artery systolic pressure is estimated at 25-30 mm Hg. Impression: Exercise myoview stress 11/20/2016: 1. The resting electrocardiogram demonstrated normal sinus rhythm, normal resting conduction and no resting arrhythmias. Cannot exclude inferior infarct and anteerior infarct, old. The stress electrocardiogram was negative for ischemia. There were occasional PVC. Patient exercised on Bruce protocol for 5:00 minutes and achieved 6.06 METS. Stress  test terminated due to dyspnea and 99% MPHR achieved (Target HR >85%). 2. 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 There is hypokinesis of the basal inferior, basal inferolateral, mid inferior, mid inferolateral, apical inferior and apical lateral myocardial wall(s). The left ventricular ejection fraction was calculated or visually estimated to be 35%. This is an high risk study. Laboratory examination (R67.88) Story: 12/04/2016: Cholesterol 146, triglycerides 105, HDL 50, LDL 75.  Glucose 159, creatinine 1.17, EGFR 60/69, potassium 4.7, BMP normal.  RBC 2.72, hemoglobin 10.2, hematocrit 30.3, MCV 111, MCH 37.5, RDW 16.1%.  CBC otherwise normal.  INR 1.0.  Prothrombin time 10.3. 11/12/2015: Creatinine 1.2, EGFR 58, potassium 4.3, CMP otherwise normal. CBC normal. Cholesterol 157, triglycerides 99, HDL 47, LDL 90. Benign essential hypertension (I10) Story: EKG 11/19/2016: Sinus rhythm at rate of 81 bpm, normal axis. No evidence of ischemia, normal EKG. Current Plans Discontinued HydroCHLOROthiazide 25MG (Start BB-abnormal stress). Hypercholesteremia (E78.00) Current Plans LIPID PANEL (93388) Controlled type 2 diabetes mellitus without complication, without long-term current use of insulin (E11.9)  Note:. Recommendations:  Patient's symptoms  of dyspnea on exertion or anginal equivalent, he has got high risk nuclear stress test. I have recommended that we proceed with coronary angiography in view of persistent symptoms. He is diabetic and we discussed multivessel CAD.  I discontinued hydrochlorothiazide and switched him to metoprolol succinate 25 mg daily. I have also recommended that we repeat lipid profile and probably consider increasing the dose of simvastatin to 40 mg in view of LDL being greater than 70 in view of high risk stress test. Schedule for cardiac catheterization, and possible angioplasty. We discussed regarding risks, benefits, alternatives to this including stress testing, CTA and continued medical therapy. Patient wants to proceed. Understands <1-2% risk of death, stroke, MI, urgent CABG, bleeding, infection, renal failure but not limited to these.  CC: Dr. Burnard Bunting  Signed by Laverda Page, MD (11/30/2016 9:21 PM)

## 2016-12-10 ENCOUNTER — Encounter (HOSPITAL_COMMUNITY)
Admission: RE | Disposition: A | Discharge status: Home / Self Care | Payer: Self-pay | Source: Ambulatory Visit | Attending: Cardiology

## 2016-12-10 ENCOUNTER — Ambulatory Visit (HOSPITAL_COMMUNITY)
Admission: RE | Admit: 2016-12-10 | Discharge: 2016-12-10 | Disposition: A | Discharge status: Home / Self Care | Payer: Medicare Other | Source: Ambulatory Visit | Attending: Cardiology | Admitting: Cardiology

## 2016-12-10 ENCOUNTER — Encounter (HOSPITAL_COMMUNITY): Payer: Self-pay | Admitting: Cardiology

## 2016-12-10 DIAGNOSIS — E7800 Pure hypercholesterolemia, unspecified: Secondary | ICD-10-CM | POA: Insufficient documentation

## 2016-12-10 DIAGNOSIS — Z87891 Personal history of nicotine dependence: Secondary | ICD-10-CM | POA: Insufficient documentation

## 2016-12-10 DIAGNOSIS — Z79899 Other long term (current) drug therapy: Secondary | ICD-10-CM | POA: Insufficient documentation

## 2016-12-10 DIAGNOSIS — I2510 Atherosclerotic heart disease of native coronary artery without angina pectoris: Secondary | ICD-10-CM | POA: Insufficient documentation

## 2016-12-10 DIAGNOSIS — E785 Hyperlipidemia, unspecified: Secondary | ICD-10-CM | POA: Diagnosis not present

## 2016-12-10 DIAGNOSIS — M1990 Unspecified osteoarthritis, unspecified site: Secondary | ICD-10-CM | POA: Insufficient documentation

## 2016-12-10 DIAGNOSIS — I129 Hypertensive chronic kidney disease with stage 1 through stage 4 chronic kidney disease, or unspecified chronic kidney disease: Secondary | ICD-10-CM | POA: Diagnosis not present

## 2016-12-10 DIAGNOSIS — N183 Chronic kidney disease, stage 3 (moderate): Secondary | ICD-10-CM | POA: Diagnosis not present

## 2016-12-10 DIAGNOSIS — E119 Type 2 diabetes mellitus without complications: Secondary | ICD-10-CM | POA: Diagnosis not present

## 2016-12-10 DIAGNOSIS — R0609 Other forms of dyspnea: Secondary | ICD-10-CM

## 2016-12-10 DIAGNOSIS — R0602 Shortness of breath: Secondary | ICD-10-CM | POA: Diagnosis present

## 2016-12-10 HISTORY — PX: INTRAVASCULAR ULTRASOUND/IVUS: CATH118244

## 2016-12-10 HISTORY — PX: LEFT HEART CATH AND CORONARY ANGIOGRAPHY: CATH118249

## 2016-12-10 LAB — GLUCOSE, CAPILLARY: GLUCOSE-CAPILLARY: 156 mg/dL — AB (ref 65–99)

## 2016-12-10 LAB — POCT ACTIVATED CLOTTING TIME
ACTIVATED CLOTTING TIME: 246 s
ACTIVATED CLOTTING TIME: 224 s

## 2016-12-10 SURGERY — LEFT HEART CATH AND CORONARY ANGIOGRAPHY
Anesthesia: LOCAL

## 2016-12-10 MED ORDER — HEPARIN SODIUM (PORCINE) 1000 UNIT/ML IJ SOLN
INTRAMUSCULAR | Status: AC
  Filled 2016-12-10: qty 1

## 2016-12-10 MED ORDER — SODIUM CHLORIDE 0.9% FLUSH: 3.0000 mL | INTRAVENOUS | Status: DC | PRN

## 2016-12-10 MED ORDER — SODIUM CHLORIDE 0.9 % WEIGHT BASED INFUSION
3.0000 mL/kg/h | INTRAVENOUS | Status: AC
  Administered 2016-12-10 (×2): 250 mL/h via INTRAVENOUS
  Administered 2016-12-10: 3 mL/kg/h via INTRAVENOUS

## 2016-12-10 MED ORDER — FENTANYL CITRATE (PF) 100 MCG/2ML IJ SOLN
INTRAMUSCULAR | Status: DC | PRN
  Administered 2016-12-10: 25 ug via INTRAVENOUS

## 2016-12-10 MED ORDER — HEPARIN (PORCINE) IN NACL 2-0.9 UNIT/ML-% IJ SOLN
INTRAMUSCULAR | Status: AC | PRN
  Administered 2016-12-10: 1000 mL

## 2016-12-10 MED ORDER — MIDAZOLAM HCL 2 MG/2ML IJ SOLN
INTRAMUSCULAR | Status: DC | PRN
  Administered 2016-12-10: 1 mg via INTRAVENOUS

## 2016-12-10 MED ORDER — ACETAMINOPHEN 325 MG PO TABS: 650.0000 mg | ORAL_TABLET | ORAL | Status: DC | PRN

## 2016-12-10 MED ORDER — ASPIRIN 81 MG PO CHEW
81.0000 mg | CHEWABLE_TABLET | ORAL | Status: AC
  Administered 2016-12-10: 81 mg via ORAL

## 2016-12-10 MED ORDER — SODIUM CHLORIDE 0.9 % IV SOLN: INTRAVENOUS | Status: AC

## 2016-12-10 MED ORDER — NITROGLYCERIN 1 MG/10 ML FOR IR/CATH LAB
INTRA_ARTERIAL | Status: DC | PRN
  Administered 2016-12-10: 200 ug via INTRACORONARY

## 2016-12-10 MED ORDER — FENTANYL CITRATE (PF) 100 MCG/2ML IJ SOLN
INTRAMUSCULAR | Status: AC
  Filled 2016-12-10: qty 2

## 2016-12-10 MED ORDER — VERAPAMIL HCL 2.5 MG/ML IV SOLN
INTRA_ARTERIAL | Status: DC | PRN
  Administered 2016-12-10: 15 mL via INTRA_ARTERIAL

## 2016-12-10 MED ORDER — ASPIRIN 81 MG PO CHEW
CHEWABLE_TABLET | ORAL | Status: AC
  Administered 2016-12-10: 81 mg via ORAL
  Filled 2016-12-10: qty 1

## 2016-12-10 MED ORDER — MIDAZOLAM HCL 2 MG/2ML IJ SOLN
INTRAMUSCULAR | Status: AC
  Filled 2016-12-10: qty 2

## 2016-12-10 MED ORDER — ONDANSETRON HCL 4 MG/2ML IJ SOLN: 4.0000 mg | Freq: Four times a day (QID) | INTRAMUSCULAR | Status: DC | PRN

## 2016-12-10 MED ORDER — VERAPAMIL HCL 2.5 MG/ML IV SOLN
INTRAVENOUS | Status: AC
  Filled 2016-12-10: qty 2

## 2016-12-10 MED ORDER — SODIUM CHLORIDE 0.9% FLUSH: 3.0000 mL | Freq: Two times a day (BID) | INTRAVENOUS | Status: DC

## 2016-12-10 MED ORDER — HEPARIN (PORCINE) IN NACL 2-0.9 UNIT/ML-% IJ SOLN
INTRAMUSCULAR | Status: AC
  Filled 2016-12-10: qty 1000

## 2016-12-10 MED ORDER — SODIUM CHLORIDE 0.9 % WEIGHT BASED INFUSION: 1.0000 mL/kg/h | INTRAVENOUS | Status: DC

## 2016-12-10 MED ORDER — SODIUM CHLORIDE 0.9 % IV SOLN: 250.0000 mL | INTRAVENOUS | Status: DC | PRN

## 2016-12-10 MED ORDER — IOPAMIDOL (ISOVUE-370) INJECTION 76%
INTRAVENOUS | Status: DC | PRN
  Administered 2016-12-10: 105 mL via INTRA_ARTERIAL

## 2016-12-10 MED ORDER — LIDOCAINE HCL (PF) 1 % IJ SOLN
INTRAMUSCULAR | Status: DC | PRN
  Administered 2016-12-10: 2 mL

## 2016-12-10 MED ORDER — VERAPAMIL HCL 2.5 MG/ML IV SOLN
INTRAVENOUS | Status: DC | PRN
  Administered 2016-12-10: 2.5 mL via INTRA_ARTERIAL

## 2016-12-10 MED ORDER — HEPARIN SODIUM (PORCINE) 1000 UNIT/ML IJ SOLN
INTRAMUSCULAR | Status: DC | PRN
  Administered 2016-12-10: 2000 [IU] via INTRAVENOUS
  Administered 2016-12-10: 3000 [IU] via INTRAVENOUS
  Administered 2016-12-10: 5000 [IU] via INTRAVENOUS

## 2016-12-10 SURGICAL SUPPLY — 17 items
CATH INFINITI 5 FR JL3.5 (CATHETERS) ×2 IMPLANT
CATH INFINITI 5FR JL4 (CATHETERS) ×2 IMPLANT
CATH INFINITI JR4 5F (CATHETERS) ×2 IMPLANT
CATH LAUNCHER 6FR AL.75 (CATHETERS) ×2 IMPLANT
CATH LAUNCHER 6FR EBU3.5 (CATHETERS) ×2 IMPLANT
CATH OPTICROSS 40MHZ (CATHETERS) ×2 IMPLANT
DEVICE RAD COMP TR BAND LRG (VASCULAR PRODUCTS) ×2 IMPLANT
GLIDESHEATH SLEND A-KIT 6F 20G (SHEATH) ×2 IMPLANT
GUIDEWIRE INQWIRE 1.5J.035X260 (WIRE) ×1 IMPLANT
INQWIRE 1.5J .035X260CM (WIRE) ×2
KIT HEART LEFT (KITS) ×2 IMPLANT
PACK CARDIAC CATHETERIZATION (CUSTOM PROCEDURE TRAY) ×2 IMPLANT
SLED PULL BACK IVUS (MISCELLANEOUS) ×2 IMPLANT
TRANSDUCER W/STOPCOCK (MISCELLANEOUS) ×2 IMPLANT
TUBING CIL FLEX 10 FLL-RA (TUBING) ×2 IMPLANT
VALVE GUARDIAN II ~~LOC~~ HEMO (MISCELLANEOUS) ×2 IMPLANT
WIRE COUGAR XT STRL 190CM (WIRE) ×2 IMPLANT

## 2016-12-10 NOTE — Discharge Instructions (Signed)

## 2016-12-10 NOTE — Interval H&P Note (Signed)
History and Physical Interval Note:  12/10/2016 7:36 AM  Willette Pa  has presented today for surgery, with the diagnosis of abnormal stress  The various methods of treatment have been discussed with the patient and family. After consideration of risks, benefits and other options for treatment, the patient has consented to  Procedure(s): LEFT HEART CATH AND CORONARY ANGIOGRAPHY (N/A) as a surgical intervention .  The patient's history has been reviewed, patient examined, no change in status, stable for surgery.  I have reviewed the patient's chart and labs.  Questions were answered to the patient's satisfaction.     1 Vessel Disease PCI CABG   No proximal LAD involvement, No proximal left dominant LCX involvement M (6); Indication 2 M (4); Indication 2   Proximal left dominant LCX involvement A (7); Indication 5 A (7); Indication 5   Proximal LAD involvement A (7); Indication 5 A (7); Indication 5   newline 2 Vessel Disease  No proximal LAD involvement A (7); Indication 8 M (6); Indication 8   Proximal LAD involvement A (7); Indication 14 A (8); Indication 14   newline 3 Vessel Disease  Low disease complexity (e.g., focal stenoses, SYNTAX <=22) A (7); Indication 19 A (8); Indication 19   Intermediate or high disease complexity (e.g., SYNTAX >=23) M (5); Indication 23 A (8); Indication 23   newline Left Main Disease  Isolated LMCA disease: ostial or midshaft A (7); Indication 24 A (9); Indication 24   Isolated LMCA disease: bifurcation involvement M (5); Indication 25 A (9); Indication 25   LMCA ostial or midshaft, concurrent low disease burden multivessel disease (e.g., 1-2 additional focal stenoses, SYNTAX <=22) A (7); Indication 26 A (9); Indication 26   LMCA ostial or midshaft, concurrent intermediate or high disease burden multivessel disease (e.g., 1-2 additional bifurcation stenoses, long stenoses, SYNTAX >=23) M (4); Indication 27 A (9); Indication 27   LMCA bifurcation involvement,  concurrent low disease burden multivessel disease (e.g., 1-2 additional focal stenoses, SYNTAX <=22) M (5); Indication 28 A (9); Indication 28   LMCA bifurcation involvement, concurrent intermediate or high disease burden multivessel disease (e.g., 1-2 additional bifurcation stenoses, long stenoses, SYNTAX >=23) R (3); Indication 29 A (9); Indication [place]

## 2018-02-17 ENCOUNTER — Inpatient Hospital Stay (HOSPITAL_COMMUNITY)
Admission: EM | Admit: 2018-02-17 | Discharge: 2018-02-19 | DRG: 812 | Disposition: A | Discharge status: Home / Self Care | Payer: Medicare Other | Attending: Student | Admitting: Student

## 2018-02-17 ENCOUNTER — Encounter (HOSPITAL_COMMUNITY): Payer: Self-pay | Admitting: Emergency Medicine

## 2018-02-17 ENCOUNTER — Other Ambulatory Visit: Payer: Self-pay

## 2018-02-17 ENCOUNTER — Emergency Department (HOSPITAL_COMMUNITY): Payer: Medicare Other

## 2018-02-17 DIAGNOSIS — Z7982 Long term (current) use of aspirin: Secondary | ICD-10-CM

## 2018-02-17 DIAGNOSIS — R001 Bradycardia, unspecified: Secondary | ICD-10-CM | POA: Diagnosis present

## 2018-02-17 DIAGNOSIS — E785 Hyperlipidemia, unspecified: Secondary | ICD-10-CM | POA: Diagnosis present

## 2018-02-17 DIAGNOSIS — R42 Dizziness and giddiness: Secondary | ICD-10-CM | POA: Diagnosis not present

## 2018-02-17 DIAGNOSIS — E119 Type 2 diabetes mellitus without complications: Secondary | ICD-10-CM | POA: Diagnosis present

## 2018-02-17 DIAGNOSIS — N179 Acute kidney failure, unspecified: Secondary | ICD-10-CM

## 2018-02-17 DIAGNOSIS — I10 Essential (primary) hypertension: Secondary | ICD-10-CM | POA: Diagnosis present

## 2018-02-17 DIAGNOSIS — Z8673 Personal history of transient ischemic attack (TIA), and cerebral infarction without residual deficits: Secondary | ICD-10-CM

## 2018-02-17 DIAGNOSIS — D539 Nutritional anemia, unspecified: Principal | ICD-10-CM | POA: Diagnosis present

## 2018-02-17 DIAGNOSIS — R55 Syncope and collapse: Secondary | ICD-10-CM

## 2018-02-17 DIAGNOSIS — I959 Hypotension, unspecified: Secondary | ICD-10-CM

## 2018-02-17 DIAGNOSIS — Z7984 Long term (current) use of oral hypoglycemic drugs: Secondary | ICD-10-CM

## 2018-02-17 DIAGNOSIS — Z87891 Personal history of nicotine dependence: Secondary | ICD-10-CM

## 2018-02-17 DIAGNOSIS — Z79899 Other long term (current) drug therapy: Secondary | ICD-10-CM

## 2018-02-17 DIAGNOSIS — D696 Thrombocytopenia, unspecified: Secondary | ICD-10-CM | POA: Diagnosis present

## 2018-02-17 DIAGNOSIS — D72821 Monocytosis (symptomatic): Secondary | ICD-10-CM | POA: Diagnosis present

## 2018-02-17 DIAGNOSIS — I951 Orthostatic hypotension: Secondary | ICD-10-CM | POA: Diagnosis present

## 2018-02-17 LAB — URINALYSIS, ROUTINE W REFLEX MICROSCOPIC
Bilirubin Urine: NEGATIVE
Glucose, UA: >=500 mg/dL — AB
Hgb urine dipstick: NEGATIVE
Ketones, ur: NEGATIVE mg/dL
Leukocytes, UA: NEGATIVE
Nitrite: NEGATIVE
PH: 5 (ref 5.0–8.0)
Protein, ur: NEGATIVE mg/dL
Specific Gravity, Urine: 1.025 (ref 1.005–1.030)

## 2018-02-17 LAB — COMPREHENSIVE METABOLIC PANEL
ALBUMIN: 4.2 g/dL (ref 3.5–5.0)
ALT: 14 U/L (ref 0–44)
AST: 20 U/L (ref 15–41)
Alkaline Phosphatase: 49 U/L (ref 38–126)
Anion gap: 9 (ref 5–15)
BUN: 27 mg/dL — ABNORMAL HIGH (ref 8–23)
CALCIUM: 9.1 mg/dL (ref 8.9–10.3)
CO2: 22 mmol/L (ref 22–32)
Chloride: 105 mmol/L (ref 98–111)
Creatinine, Ser: 1.26 mg/dL — ABNORMAL HIGH (ref 0.61–1.24)
GFR calc Af Amer: >60 mL/min (ref >60)
GFR calc non Af Amer: 54 mL/min — ABNORMAL LOW (ref >60)
Glucose, Bld: 198 mg/dL — ABNORMAL HIGH (ref 70–99)
Potassium: 4 mmol/L (ref 3.5–5.1)
Sodium: 136 mmol/L (ref 135–145)
Total Bilirubin: 1.1 mg/dL (ref 0.3–1.2)
Total Protein: 6.6 g/dL (ref 6.5–8.1)

## 2018-02-17 LAB — CBC WITH DIFFERENTIAL/PLATELET
Abs Immature Granulocytes: 0.04 10*3/uL (ref 0.00–0.07)
Basophils Absolute: 0 10*3/uL (ref 0.0–0.1)
Basophils Relative: 0 %
Eosinophils Absolute: 0 10*3/uL (ref 0.0–0.5)
Eosinophils Relative: 0 %
HCT: 28.5 % — ABNORMAL LOW (ref 39.0–52.0)
HEMOGLOBIN: 8.9 g/dL — AB (ref 13.0–17.0)
IMMATURE GRANULOCYTES: 0 %
Lymphocytes Relative: 9 %
Lymphs Abs: 0.8 10*3/uL (ref 0.7–4.0)
MCH: 38.7 pg — AB (ref 26.0–34.0)
MCHC: 31.2 g/dL (ref 30.0–36.0)
MCV: 123.9 fL — ABNORMAL HIGH (ref 80.0–100.0)
Monocytes Absolute: 1.2 10*3/uL — ABNORMAL HIGH (ref 0.1–1.0)
Monocytes Relative: 14 %
NEUTROS ABS: 7 10*3/uL (ref 1.7–7.7)
NEUTROS PCT: 77 %
Platelets: 126 10*3/uL — ABNORMAL LOW (ref 150–400)
RBC: 2.3 MIL/uL — ABNORMAL LOW (ref 4.22–5.81)
RDW: 14.9 % (ref 11.5–15.5)
WBC: 9.1 10*3/uL (ref 4.0–10.5)
nRBC: 0 % (ref 0.0–0.2)

## 2018-02-17 LAB — TYPE AND SCREEN
ABO/RH(D): O POS
ANTIBODY SCREEN: NEGATIVE

## 2018-02-17 LAB — IRON AND TIBC
Iron: 89 ug/dL (ref 45–182)
Saturation Ratios: 27 % (ref 17.9–39.5)
TIBC: 330 ug/dL (ref 250–450)
UIBC: 241 ug/dL

## 2018-02-17 LAB — POC OCCULT BLOOD, ED: Fecal Occult Bld: NEGATIVE

## 2018-02-17 LAB — I-STAT CG4 LACTIC ACID, ED
Lactic Acid, Venous: 1.82 mmol/L (ref 0.5–1.9)
Lactic Acid, Venous: 0.53 mmol/L (ref 0.5–1.9)

## 2018-02-17 LAB — RETICULOCYTES
Immature Retic Fract: 25.3 % — ABNORMAL HIGH (ref 2.3–15.9)
RBC.: 2.13 MIL/uL — ABNORMAL LOW (ref 4.22–5.81)
Retic Count, Absolute: 61.3 10*3/uL (ref 19.0–186.0)
Retic Ct Pct: 2.9 % (ref 0.4–3.1)

## 2018-02-17 LAB — FERRITIN: Ferritin: 149 ng/mL (ref 24–336)

## 2018-02-17 LAB — VITAMIN B12: Vitamin B-12: 673 pg/mL (ref 180–914)

## 2018-02-17 LAB — FOLATE: FOLATE: 41.8 ng/mL (ref >5.9)

## 2018-02-17 MED ORDER — SODIUM CHLORIDE 0.9 % IV BOLUS
1000.0000 mL | Freq: Once | INTRAVENOUS | Status: AC
  Administered 2018-02-17: 1000 mL via INTRAVENOUS

## 2018-02-17 MED ORDER — HYDRALAZINE HCL 20 MG/ML IJ SOLN: 10.0000 mg | INTRAMUSCULAR | Status: DC | PRN

## 2018-02-17 MED ORDER — ONDANSETRON HCL 4 MG PO TABS: 4.0000 mg | ORAL_TABLET | Freq: Four times a day (QID) | ORAL | Status: DC | PRN

## 2018-02-17 MED ORDER — ACETAMINOPHEN 650 MG RE SUPP: 650.0000 mg | Freq: Four times a day (QID) | RECTAL | Status: DC | PRN

## 2018-02-17 MED ORDER — ACETAMINOPHEN 325 MG PO TABS: 650.0000 mg | ORAL_TABLET | Freq: Four times a day (QID) | ORAL | Status: DC | PRN

## 2018-02-17 MED ORDER — ROSUVASTATIN CALCIUM 20 MG PO TABS
20.0000 mg | ORAL_TABLET | Freq: Every day | ORAL | Status: DC
  Administered 2018-02-18: 20 mg via ORAL
  Filled 2018-02-17: qty 1

## 2018-02-17 MED ORDER — INSULIN ASPART 100 UNIT/ML ~~LOC~~ SOLN
0.0000 [IU] | Freq: Three times a day (TID) | SUBCUTANEOUS | Status: DC
  Administered 2018-02-18: 2 [IU] via SUBCUTANEOUS
  Administered 2018-02-18 (×2): 1 [IU] via SUBCUTANEOUS
  Administered 2018-02-19: 2 [IU] via SUBCUTANEOUS
  Administered 2018-02-19: 1 [IU] via SUBCUTANEOUS
  Administered 2018-02-19: 3 [IU] via SUBCUTANEOUS

## 2018-02-17 MED ORDER — SODIUM CHLORIDE 0.9 % IV SOLN
INTRAVENOUS | Status: AC
  Administered 2018-02-17 – 2018-02-18 (×2): via INTRAVENOUS

## 2018-02-17 MED ORDER — ONDANSETRON HCL 4 MG/2ML IJ SOLN: 4.0000 mg | Freq: Four times a day (QID) | INTRAMUSCULAR | Status: DC | PRN

## 2018-02-17 NOTE — H&P (Signed)
History and Physical    Steve Maldonado ENI:[id number] DOB: 12-18-1940 DOA: 02/17/2018  PCP: Burnard Bunting, MD  Patient coming from: Home.  Chief Complaint: Dizziness.  HPI: Steve Maldonado is a 77 y.o. male with history of stroke, hypertension, hyperlipidemia was brought to the ER after patient felt dizzy at home.  Patient states he was cooking Thanksgiving lunch when he started developing dizziness.  He had to sit onto the recliner despite which he was still feeling dizzy for at least 3 minutes.  During episode he had mild chest tightness which resolved.  EMS was called and he was found to have blood pressure of 70 systolic.  Patient was brought to the ER.  He takes his blood pressure medications in the night.  ED Course: In the ER he was given 2 L normal saline bolus following which his blood pressure improved.  EKG shows sinus bradycardia at around 58 bpm.  Hemoglobin was found to be around 8 and his last hemoglobin in our system was many years ago which was normal.  Patient does not have any obvious bleeding as per the patient.  Had a recent health checkup with his primary care physician last week and was told that it was normal but does not recall his lab works.  Stool for occult blood is negative.  Patient admitted for near syncopal episode and anemia.  Review of Systems: As per HPI, rest all negative.   Past Medical History:  Diagnosis Date  . Hyperlipidemia   . Hypertension   . Stroke [facility]) 1970    Past Surgical History:  Procedure Laterality Date  . APPENDECTOMY  1980  . INTRAVASCULAR ULTRASOUND/IVUS N/A 12/10/2016   Procedure: Intravascular Ultrasound/IVUS;  Surgeon: Nigel Mormon, MD;  Location: [place] [facility];  Service: Cardiovascular;  Laterality: N/A;  . LEFT HEART CATH AND CORONARY ANGIOGRAPHY N/A 12/10/2016   Procedure: LEFT HEART CATH AND CORONARY ANGIOGRAPHY;  Surgeon: Nigel Mormon, MD;  Location: [place] [facility];  Service: Cardiovascular;   Laterality: N/A;     reports that he quit smoking about 39 years ago. His smoking use included cigarettes. He has a 20.00 pack-year smoking history. He has never used smokeless tobacco. He reports that he drinks alcohol. He reports that he does not use drugs.  No Known Allergies  Family History  Problem Relation Age of Onset  . Colon cancer Neg Hx   . Esophageal cancer Neg Hx   . Rectal cancer Neg Hx   . Stomach cancer Neg Hx     Prior to Admission medications   Medication Sig Start Date End Date Taking? Authorizing Provider  amLODipine (NORVASC) 5 MG tablet Take 5 mg by mouth at bedtime.    Yes [provider]  aspirin 81 MG tablet Take 81 mg by mouth at bedtime.    Yes [provider]  hydrochlorothiazide (HYDRODIURIL) 25 MG tablet Take 25 mg by mouth daily.   Yes [provider]  JARDIANCE 25 MG TABS tablet Take 25 mg by mouth at bedtime. 02/15/18  Yes [provider]  losartan (COZAAR) 50 MG tablet Take 50 mg by mouth at bedtime.    Yes [provider]  metoprolol succinate (TOPROL-XL) 50 MG 24 hr tablet Take 50 mg by mouth at bedtime.  01/03/18  Yes [provider]  Multiple Vitamins-Minerals (CENTRUM SILVER PO) Take 1 tablet by mouth at bedtime.    Yes [provider]  rosuvastatin (CRESTOR) 20 MG tablet Take  20 mg by mouth at bedtime. 01/17/18  Yes [provider]  SitaGLIPtin-MetFORMIN HCl (JANUMET XR) [phone] MG TB24 Take 1 tablet by mouth every evening.   Yes [provider]    Physical Exam: Vitals:   02/17/18 1815 02/17/18 1830 02/17/18 1845 02/17/18 2014  BP: 111/67 114/70 120/64 125/63  Pulse: (!) 55 (!) 59 61 60  Resp: 14 17 18 18   Temp:    98.7 F (37.1 C)  TempSrc:    Oral  SpO2: 99% 96% 94% 100%  Weight:      Height:          Constitutional: Moderately built and nourished. Vitals:   02/17/18 1815 02/17/18 1830 02/17/18 1845 02/17/18 2014  BP: 111/67 114/70 120/64 125/63    Pulse: (!) 55 (!) 59 61 60  Resp: 14 17 18 18   Temp:    98.7 F (37.1 C)  TempSrc:    Oral  SpO2: 99% 96% 94% 100%  Weight:      Height:       Eyes: Anicteric no pallor. ENMT: No discharge from the ears eyes nose or mouth. Neck: No mass felt.  No neck rigidity. Respiratory: No rhonchi or crepitations. Cardiovascular: S1-S2 heard no murmurs appreciated. Abdomen: Soft nontender bowel sounds present. Musculoskeletal: No edema.  No joint effusion. Skin: No rash. Neurologic: Alert awake oriented to time place and person.  Moves all extremities. Psychiatric: Appears normal.  Normal affect.   Labs on Admission: I have personally reviewed following labs and imaging studies  CBC: Recent Labs  Lab 02/17/18 1418  WBC 9.1  NEUTROABS 7.0  HGB 8.9*  HCT 28.5*  MCV 123.9*  PLT 627*   Basic Metabolic Panel: Recent Labs  Lab 02/17/18 1418  NA 136  K 4.0  CL 105  CO2 22  GLUCOSE 198*  BUN 27*  CREATININE 1.26*  CALCIUM 9.1   GFR: Estimated Creatinine Clearance: 57.3 mL/min (A) (by C-G formula based on SCr of 1.26 mg/dL (H)). Liver Function Tests: Recent Labs  Lab 02/17/18 1418  AST 20  ALT 14  ALKPHOS 49  BILITOT 1.1  PROT 6.6  ALBUMIN 4.2   No results for input(s): LIPASE, AMYLASE in the last 168 hours. No results for input(s): AMMONIA in the last 168 hours. Coagulation Profile: No results for input(s): INR, PROTIME in the last 168 hours. Cardiac Enzymes: No results for input(s): CKTOTAL, CKMB, CKMBINDEX, TROPONINI in the last 168 hours. BNP (last 3 results) No results for input(s): PROBNP in the last 8760 hours. HbA1C: No results for input(s): HGBA1C in the last 72 hours. CBG: No results for input(s): GLUCAP in the last 168 hours. Lipid Profile: No results for input(s): CHOL, HDL, LDLCALC, TRIG, CHOLHDL, LDLDIRECT in the last 72 hours. Thyroid Function Tests: No results for input(s): TSH, T4TOTAL, FREET4, T3FREE, THYROIDAB in the last 72 hours. Anemia  Panel: Recent Labs    02/17/18 1946  VITAMINB12 673  FOLATE 41.8  FERRITIN 149  TIBC 330  IRON 89  RETICCTPCT 2.9   Urine analysis:    Component Value Date/Time   COLORURINE YELLOW 02/17/2018 1758   APPEARANCEUR CLEAR 02/17/2018 1758   LABSPEC 1.025 02/17/2018 1758   PHURINE 5.0 02/17/2018 1758   GLUCOSEU >=500 (A) 02/17/2018 1758   HGBUR NEGATIVE 02/17/2018 1758   BILIRUBINUR NEGATIVE 02/17/2018 1758   KETONESUR NEGATIVE 02/17/2018 1758   PROTEINUR NEGATIVE 02/17/2018 1758   NITRITE NEGATIVE 02/17/2018 1758   LEUKOCYTESUR NEGATIVE 02/17/2018 1758   Sepsis Labs: @LABRCNTIP (procalcitonin:4,lacticidven:4) )  No results found for this or any previous visit (from the past 240 hour(s)).   Radiological Exams on Admission: Dg Chest 2 View  Result Date: 02/17/2018 CLINICAL DATA:  Syncope EXAM: CHEST - 2 VIEW COMPARISON:  06/08/2013 chest radiograph. FINDINGS: Stable cardiomediastinal silhouette with normal heart size. No pneumothorax. No pleural effusion. No pulmonary edema. Mild reticular opacities at the right greater than left lung bases, slightly increased. No acute consolidative airspace disease. IMPRESSION: Mild bibasilar reticular opacities, right greater than left, slightly increased, suggestive of nonspecific mild fibrosis, cannot exclude interstitial lung disease. No acute cardiopulmonary disease. Electronically Signed   By: Ilona Sorrel M.D.   On: 02/17/2018 15:04    EKG: Independently reviewed.  Sinus bradycardia with heart rate around 58 bpm.  Assessment/Plan Active Problems:   Essential hypertension, benign   Near syncope   Hypotension   Diabetes mellitus type 2 in nonobese (HCC)   Macrocytic anemia   Thrombocytopenia (HCC)   History of CVA in adulthood    1. Near syncope -patient was hypotensive at presentation.  Hold antihypertensive for now and gently hydrate.  Monitor in telemetry.  Because of near syncope not clear.  Patient has had unremarkable cardiac cath  last year. 2. Macrocytic anemia with thrombocytopenia -no recent labs to compare.  Patient has had blood work done by his primary care physician last week by Dr. Reynaldo Minium and may try to obtain it if possible in the morning.  For now we will trend CBC.  Check anemia panel. 3. History of CVA on aspirin and statins. 4. Hypertension presently hypotensive so holding off patient's antihypertensives.  Keep patient on PRN IV hydralazine. 5. Diabetes mellitus type 2 we will keep patient on sliding scale coverage.   DVT prophylaxis: SCDs for now until we make sure there is no GI bleed. Code Status: Full code. Family Communication: Discussed with patient. Disposition Plan: Home. Consults called: None. Admission status: Observation.   Rise Patience MD [facility] Pager [phone].  If 7PM-7AM, please contact night-coverage [email or web address] Password [facility]  02/17/2018, 10:54 PM

## 2018-02-17 NOTE — ED Triage Notes (Signed)
Patient arrived via [facility], reports that he was in the kitchen when he felt he was hot and lightheaded and sat down. Witnesses states that patient did not loose consciousness. EMS reports initial BP at 78/44; he received 118ml NS. On arrival patient is A&O X4, denies chest pain/discomfort.

## 2018-02-17 NOTE — ED Provider Notes (Signed)
Pt's care assumed at 3pm.   Hemoglobin is 8.9  Pt is hemocult negative.  Pt has had improved heart rate and blood pressure after Iv fluids x 1 liter.  Pt does not have a hemoglobin on record in 11 years.  Pt reports he saw Dr. Reynaldo Minium last Monday and was told everything was normal.  Pt reports he did have blood work done.   Pt has elevated BUN and Creatine, probably second to dehydration.   Consult to Hospitalist for admission   Steve Maldonado 02/17/18 1913    Malvin Johns, MD 02/17/18 [phone]

## 2018-02-17 NOTE — ED Notes (Signed)
Diner tray ordered

## 2018-02-17 NOTE — ED Notes (Signed)
Helped get patient  undress on the monitor did ekg shown to Dr Dayna Barker

## 2018-02-17 NOTE — ED Provider Notes (Signed)
[place] EMERGENCY DEPARTMENT Provider Note   CSN: [id number] Arrival date & time: 02/17/18  1403     History   Chief Complaint No chief complaint on file.   HPI Steve Maldonado is a 77 y.o. male.  HPI Steve Maldonado is a 77 y.o. male with history of hypertension, hyperlipidemia, CVA, presents to emergency department with complaint of a near syncopal episode.  Patient states he was in the kitchen cooking Thanksgiving meal, was starting mashed potatoes when suddenly started feeling dizzy and lightheaded.  He states he felt like he was going to faint, so he walked to the chair and sat down.  He states he had to put his head down, he never lost consciousness.  He was helped to the ground.  He states symptoms lasted approximately 3 minutes.  EMS was called.  He denies any chest pain or shortness of breath during this episode.  He denies any palpitations.  He states he did get clammy.  He states he is feeling much better.  Initial blood pressure upon EMS arrival was 70 systolic.  Patient states he takes a blood pressure medicine and takes it at nighttime.  Last dose was taken last night.  He denies any other associated symptoms.  He states he was feeling normal prior to this episode.  Past Medical History:  Diagnosis Date  . Hyperlipidemia   . Hypertension   . Stroke [facility]) 1970    Patient Active Problem List   Diagnosis Date Noted  . Dyspnea on exertion 12/06/2016  . Upper airway cough syndrome 06/08/2013  . MIXED HYPERLIPIDEMIA 11/15/2008  . ESSENTIAL HYPERTENSION, BENIGN 11/15/2008  . CHEST PAIN 11/15/2008    Past Surgical History:  Procedure Laterality Date  . APPENDECTOMY  1980  . INTRAVASCULAR ULTRASOUND/IVUS N/A 12/10/2016   Procedure: Intravascular Ultrasound/IVUS;  Surgeon: Nigel Mormon, MD;  Location: [place] [facility];  Service: Cardiovascular;  Laterality: N/A;  . LEFT HEART CATH AND CORONARY ANGIOGRAPHY N/A 12/10/2016   Procedure: LEFT HEART  CATH AND CORONARY ANGIOGRAPHY;  Surgeon: Nigel Mormon, MD;  Location: [place] [facility];  Service: Cardiovascular;  Laterality: N/A;        Home Medications    Prior to Admission medications   Medication Sig Start Date End Date Taking? Authorizing Provider  amLODipine (NORVASC) 2.5 MG tablet Take 2.5 mg by mouth daily.    [provider]  aspirin 81 MG tablet Take 81 mg by mouth daily.    [provider]  hydrochlorothiazide (HYDRODIURIL) 25 MG tablet Take 25 mg by mouth daily.    [provider]  losartan (COZAAR) 50 MG tablet Take 50 mg by mouth 2 (two) times daily.    [provider]  metoprolol succinate (TOPROL-XL) 25 MG 24 hr tablet Take 25 mg by mouth daily.    [provider]  Multiple Vitamins-Minerals (CENTRUM SILVER PO) Take 1 tablet by mouth daily.    [provider]  simvastatin (ZOCOR) 20 MG tablet Take 1 tablet by mouth daily at 12 noon.  04/12/13   [provider]  SitaGLIPtin-MetFORMIN HCl (JANUMET XR) [phone] MG TB24 Take 1 tablet by mouth every evening.    [provider]    Family History Family History  Problem Relation Age of Onset  . Colon cancer Neg Hx   . Esophageal cancer Neg Hx   . Rectal cancer Neg Hx   . Stomach cancer Neg Hx     Social History  Social History   Tobacco Use  . Smoking status: Former Smoker    Packs/day: 1.00    Years: 20.00    Pack years: 20.00    Types: Cigarettes    Last attempt to quit: 03/23/1978    Years since quitting: 39.9  . Smokeless tobacco: Never Used  Substance Use Topics  . Alcohol use: Yes    Alcohol/week: 0.0 standard drinks    Comment: wine/has not had any in 1 year  . Drug use: No     Allergies   Patient has no known allergies.   Review of Systems Review of Systems  Constitutional: Positive for diaphoresis. Negative for chills and fever.  Respiratory: Negative for cough, chest tightness and shortness of breath.     Cardiovascular: Negative for chest pain, palpitations and leg swelling.  Gastrointestinal: Negative for abdominal distention, abdominal pain, diarrhea, nausea and vomiting.  Genitourinary: Negative for dysuria, frequency, hematuria and urgency.  Musculoskeletal: Negative for arthralgias, myalgias, neck pain and neck stiffness.  Skin: Negative for rash.  Allergic/Immunologic: Negative for immunocompromised state.  Neurological: Positive for dizziness and light-headedness. Negative for weakness, numbness and headaches.  All other systems reviewed and are negative.    Physical Exam Updated Vital Signs BP (!) 98/47 (BP Location: Right Arm)   Pulse (!) 58   Temp 97.6 F (36.4 C) (Oral)   Resp 16   Ht 6\' 3"  (1.905 m)   Wt 83.9 kg   SpO2 98%   BMI 23.12 kg/m   Physical Exam  Constitutional: He is oriented to person, place, and time. He appears well-developed and well-nourished. No distress.  HENT:  Head: Normocephalic and atraumatic.  Eyes: Pupils are equal, round, and reactive to light. Conjunctivae and EOM are normal.  Neck: Normal range of motion. Neck supple.  Cardiovascular: Normal rate, regular rhythm and normal heart sounds.  Pulmonary/Chest: Effort normal. No respiratory distress. He has no wheezes. He has no rales.  Abdominal: Soft. Bowel sounds are normal. He exhibits no distension. There is no tenderness. There is no rebound.  Musculoskeletal: He exhibits no edema.  Neurological: He is alert and oriented to person, place, and time. He displays normal reflexes. No cranial nerve deficit. Coordination normal.  Skin: Skin is warm and dry.  Nursing note and vitals reviewed.    ED Treatments / Results  Labs (all labs ordered are listed, but only abnormal results are displayed) Labs Reviewed - No data to display  EKG None  Radiology No results found.  Procedures Procedures (including critical care time)  Medications Ordered in ED Medications  sodium chloride 0.9  % bolus 1,000 mL (has no administration in time range)     Initial Impression / Assessment and Plan / ED Course  I have reviewed the triage vital signs and the nursing notes.  Pertinent labs & imaging results that were available during my care of the patient were reviewed by me and considered in my medical decision making (see chart for details).     Patient in emergency department with near syncopal episode.  Patient is currently alert, oriented x3, no acute distress, asymptomatic.  Current blood pressure is 98/47.  He denies any complaints.  He was hypotensive for EMS.  He received 100 cc of normal saline.  I will give him another bolus, he does not appear to be fluid overloaded.  Will check labs and a chest x-ray.  3:16 PM Pt will be signed out at shift change, pending labs and xray of chest.  Final Clinical Impressions(s) / ED Diagnoses   Final diagnoses:  None    ED Discharge Orders    None       Jeannett Senior, PA-C 02/17/18 1518    Mesner, Corene Cornea, MD 02/18/18 1055

## 2018-02-18 ENCOUNTER — Encounter (HOSPITAL_COMMUNITY): Payer: Self-pay | Admitting: *Deleted

## 2018-02-18 ENCOUNTER — Observation Stay (HOSPITAL_COMMUNITY): Payer: Medicare Other

## 2018-02-18 ENCOUNTER — Other Ambulatory Visit: Payer: Self-pay

## 2018-02-18 DIAGNOSIS — R42 Dizziness and giddiness: Secondary | ICD-10-CM | POA: Diagnosis present

## 2018-02-18 DIAGNOSIS — E785 Hyperlipidemia, unspecified: Secondary | ICD-10-CM | POA: Diagnosis present

## 2018-02-18 DIAGNOSIS — Z7982 Long term (current) use of aspirin: Secondary | ICD-10-CM | POA: Diagnosis not present

## 2018-02-18 DIAGNOSIS — I951 Orthostatic hypotension: Secondary | ICD-10-CM | POA: Diagnosis present

## 2018-02-18 DIAGNOSIS — Z8673 Personal history of transient ischemic attack (TIA), and cerebral infarction without residual deficits: Secondary | ICD-10-CM | POA: Diagnosis not present

## 2018-02-18 DIAGNOSIS — R55 Syncope and collapse: Secondary | ICD-10-CM | POA: Diagnosis not present

## 2018-02-18 DIAGNOSIS — Z7984 Long term (current) use of oral hypoglycemic drugs: Secondary | ICD-10-CM | POA: Diagnosis not present

## 2018-02-18 DIAGNOSIS — D72821 Monocytosis (symptomatic): Secondary | ICD-10-CM | POA: Diagnosis present

## 2018-02-18 DIAGNOSIS — Z79899 Other long term (current) drug therapy: Secondary | ICD-10-CM | POA: Diagnosis not present

## 2018-02-18 DIAGNOSIS — Z87891 Personal history of nicotine dependence: Secondary | ICD-10-CM | POA: Diagnosis not present

## 2018-02-18 DIAGNOSIS — E119 Type 2 diabetes mellitus without complications: Secondary | ICD-10-CM | POA: Diagnosis not present

## 2018-02-18 DIAGNOSIS — D539 Nutritional anemia, unspecified: Secondary | ICD-10-CM | POA: Diagnosis present

## 2018-02-18 DIAGNOSIS — I10 Essential (primary) hypertension: Secondary | ICD-10-CM | POA: Diagnosis not present

## 2018-02-18 DIAGNOSIS — R001 Bradycardia, unspecified: Secondary | ICD-10-CM | POA: Diagnosis present

## 2018-02-18 DIAGNOSIS — D696 Thrombocytopenia, unspecified: Secondary | ICD-10-CM | POA: Diagnosis present

## 2018-02-18 LAB — ABO/RH: ABO/RH(D): O POS

## 2018-02-18 LAB — BASIC METABOLIC PANEL
Anion gap: 6 (ref 5–15)
BUN: 21 mg/dL (ref 8–23)
CO2: 22 mmol/L (ref 22–32)
CREATININE: 1.12 mg/dL (ref 0.61–1.24)
Calcium: 8.8 mg/dL — ABNORMAL LOW (ref 8.9–10.3)
Chloride: 110 mmol/L (ref 98–111)
GFR calc Af Amer: >60 mL/min (ref >60)
GFR calc non Af Amer: >60 mL/min (ref >60)
GLUCOSE: 121 mg/dL — AB (ref 70–99)
Potassium: 3.5 mmol/L (ref 3.5–5.1)
Sodium: 138 mmol/L (ref 135–145)

## 2018-02-18 LAB — CBC
HCT: 26.7 % — ABNORMAL LOW (ref 39.0–52.0)
HCT: 24.9 % — ABNORMAL LOW (ref 39.0–52.0)
HCT: 27.6 % — ABNORMAL LOW (ref 39.0–52.0)
Hemoglobin: 8.2 g/dL — ABNORMAL LOW (ref 13.0–17.0)
Hemoglobin: 8 g/dL — ABNORMAL LOW (ref 13.0–17.0)
Hemoglobin: 8.4 g/dL — ABNORMAL LOW (ref 13.0–17.0)
MCH: 38.8 pg — ABNORMAL HIGH (ref 26.0–34.0)
MCH: 37 pg — ABNORMAL HIGH (ref 26.0–34.0)
MCH: 37.4 pg — AB (ref 26.0–34.0)
MCHC: 30.7 g/dL (ref 30.0–36.0)
MCHC: 30.4 g/dL (ref 30.0–36.0)
MCHC: 32.1 g/dL (ref 30.0–36.0)
MCV: 121.9 fL — ABNORMAL HIGH (ref 80.0–100.0)
MCV: 120.9 fL — ABNORMAL HIGH (ref 80.0–100.0)
MCV: 121.6 fL — ABNORMAL HIGH (ref 80.0–100.0)
PLATELETS: 113 10*3/uL — AB (ref 150–400)
Platelets: 114 10*3/uL — ABNORMAL LOW (ref 150–400)
Platelets: 112 10*3/uL — ABNORMAL LOW (ref 150–400)
RBC: 2.19 MIL/uL — ABNORMAL LOW (ref 4.22–5.81)
RBC: 2.06 MIL/uL — ABNORMAL LOW (ref 4.22–5.81)
RBC: 2.27 MIL/uL — ABNORMAL LOW (ref 4.22–5.81)
RDW: 14.8 % (ref 11.5–15.5)
RDW: 14.9 % (ref 11.5–15.5)
RDW: 15 % (ref 11.5–15.5)
WBC: 6.7 10*3/uL (ref 4.0–10.5)
WBC: 5.8 10*3/uL (ref 4.0–10.5)
WBC: 5.7 10*3/uL (ref 4.0–10.5)
nRBC: 0 % (ref 0.0–0.2)
nRBC: 0 % (ref 0.0–0.2)
nRBC: 0 % (ref 0.0–0.2)

## 2018-02-18 LAB — RETICULOCYTES
Immature Retic Fract: 22.5 % — ABNORMAL HIGH (ref 2.3–15.9)
RBC.: 2.09 MIL/uL — ABNORMAL LOW (ref 4.22–5.81)
Retic Count, Absolute: 56.8 10*3/uL (ref 19.0–186.0)
Retic Ct Pct: 2.7 % (ref 0.4–3.1)

## 2018-02-18 LAB — IRON AND TIBC
Iron: 92 ug/dL (ref 45–182)
Saturation Ratios: 30 % (ref 17.9–39.5)
TIBC: 309 ug/dL (ref 250–450)
UIBC: 217 ug/dL

## 2018-02-18 LAB — TSH: TSH: 2.126 u[IU]/mL (ref 0.350–4.500)

## 2018-02-18 LAB — TROPONIN I
Troponin I: <0.03 ng/mL (ref <0.03)
Troponin I: <0.03 ng/mL (ref <0.03)

## 2018-02-18 LAB — FERRITIN: Ferritin: 151 ng/mL (ref 24–336)

## 2018-02-18 LAB — VITAMIN B12: VITAMIN B 12: 701 pg/mL (ref 180–914)

## 2018-02-18 LAB — GLUCOSE, CAPILLARY
Glucose-Capillary: 144 mg/dL — ABNORMAL HIGH (ref 70–99)
Glucose-Capillary: 161 mg/dL — ABNORMAL HIGH (ref 70–99)
Glucose-Capillary: 125 mg/dL — ABNORMAL HIGH (ref 70–99)
Glucose-Capillary: 183 mg/dL — ABNORMAL HIGH (ref 70–99)

## 2018-02-18 LAB — BRAIN NATRIURETIC PEPTIDE: B Natriuretic Peptide: 105.4 pg/mL — ABNORMAL HIGH (ref 0.0–100.0)

## 2018-02-18 LAB — FOLATE: Folate: 32.2 ng/mL (ref >5.9)

## 2018-02-18 NOTE — Progress Notes (Signed)
NURSE HANDOFF  KEONTE DAUBENSPECK NIO:[id number] DOB: Jul 01, 1940 DOA: 02/17/2018  PCP: Burnard Bunting, MD   Past Medical History:  Diagnosis Date  . Hyperlipidemia   . Hypertension   . Stroke [facility]) 1970         Past Surgical History:  Procedure Laterality Date  . APPENDECTOMY  1980  . INTRAVASCULAR ULTRASOUND/IVUS N/A 12/10/2016   Procedure: Intravascular Ultrasound/IVUS;  Surgeon: Nigel Mormon, MD;  Location: [place] [facility];  Service: Cardiovascular;  Laterality: N/A;  . LEFT HEART CATH AND CORONARY ANGIOGRAPHY N/A 12/10/2016   Procedure: LEFT HEART CATH AND CORONARY ANGIOGRAPHY;  Surgeon: Nigel Mormon, MD;  Location: [place] [facility];  Service: Cardiovascular;  Laterality: N/A;   Dx: Syncope Tele Full Code  NKDA  Diet: Heart healthy carb modified  Ambulatory with steady gait , one assist due to + orthostatics. Pending labs. No pending procedures.   Called report to [employer]. Answered all questions asked.

## 2018-02-18 NOTE — Progress Notes (Addendum)
Steve Maldonado ZOX:[id number] DOB: 06/27/1940 DOA: 02/17/2018  PCP: Burnard Bunting, MD  Patient coming from: Home   Past Medical History:  Diagnosis Date  . Hyperlipidemia   . Hypertension   . Stroke [facility]) 1970         Past Surgical History:  Procedure Laterality Date  . APPENDECTOMY  1980  . INTRAVASCULAR ULTRASOUND/IVUS N/A 12/10/2016   Procedure: Intravascular Ultrasound/IVUS;  Surgeon: Nigel Mormon, MD;  Location: [place] [facility];  Service: Cardiovascular;  Laterality: N/A;  . LEFT HEART CATH AND CORONARY ANGIOGRAPHY N/A 12/10/2016   Procedure: LEFT HEART CATH AND CORONARY ANGIOGRAPHY;  Surgeon: Nigel Mormon, MD;  Location: [place] [facility];  Service: Cardiovascular;  Laterality: N/A;   Pt alert and oriented x4. Pt verbalized understanding plan of care. Pt ambulatory with steady gait, hx previous stroke ,baseline walk is with left sided limp. Pt vitals remain stable throughout shift. HR ranging between 58-65 bpm throughout the night, rhythm sinus brady / NSR. Diagnostic tests remain stable, closely monitoring CBC trend. Pt progressing towards discharge.

## 2018-02-18 NOTE — Plan of Care (Signed)
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
## 2018-02-18 NOTE — Progress Notes (Signed)
  Echocardiogram 2D Echocardiogram has been performed.  Jennette Dubin 02/18/2018, 2:16 PM

## 2018-02-19 DIAGNOSIS — I10 Essential (primary) hypertension: Secondary | ICD-10-CM

## 2018-02-19 DIAGNOSIS — D72821 Monocytosis (symptomatic): Secondary | ICD-10-CM

## 2018-02-19 DIAGNOSIS — I9589 Other hypotension: Secondary | ICD-10-CM

## 2018-02-19 DIAGNOSIS — E861 Hypovolemia: Secondary | ICD-10-CM

## 2018-02-19 LAB — GLUCOSE, CAPILLARY
Glucose-Capillary: 187 mg/dL — ABNORMAL HIGH (ref 70–99)
Glucose-Capillary: 210 mg/dL — ABNORMAL HIGH (ref 70–99)
Glucose-Capillary: 132 mg/dL — ABNORMAL HIGH (ref 70–99)

## 2018-02-19 LAB — CBC
HCT: 25.2 % — ABNORMAL LOW (ref 39.0–52.0)
Hemoglobin: 7.8 g/dL — ABNORMAL LOW (ref 13.0–17.0)
MCH: 37.3 pg — ABNORMAL HIGH (ref 26.0–34.0)
MCHC: 31 g/dL (ref 30.0–36.0)
MCV: 120.6 fL — ABNORMAL HIGH (ref 80.0–100.0)
NRBC: 0 % (ref 0.0–0.2)
PLATELETS: 104 10*3/uL — AB (ref 150–400)
RBC: 2.09 MIL/uL — ABNORMAL LOW (ref 4.22–5.81)
RDW: 15.1 % (ref 11.5–15.5)
WBC: 6.2 10*3/uL (ref 4.0–10.5)

## 2018-02-19 LAB — SAVE SMEAR(SSMR), FOR PROVIDER SLIDE REVIEW

## 2018-02-19 LAB — LACTATE DEHYDROGENASE: LDH: 175 U/L (ref 98–192)

## 2018-02-19 LAB — SEDIMENTATION RATE: SED RATE: 35 mm/h — AB (ref 0–16)

## 2018-02-19 MED ORDER — CARVEDILOL 3.125 MG PO TABS: 3.1250 mg | ORAL_TABLET | Freq: Two times a day (BID) | ORAL | 0 refills | Status: DC

## 2018-02-19 NOTE — Progress Notes (Signed)
Pt given verbal d/c instructions. Provided with paper copy of AVS. No further questions. VSS at d/c. IV removed per orders. Pt belonging sent with patient. Pt d/c by wheelchair through front entrance.

## 2018-02-19 NOTE — Discharge Summary (Signed)
Physician Discharge Summary  Steve Maldonado MEQ:[id number] DOB: 01/19/1941 DOA: 02/17/2018  PCP: Burnard Bunting, MD  Admit date: 02/17/2018 Discharge date: 02/19/2018  Admitted From: Home Disposition: Home  Recommendations for Outpatient Follow-up:  1. Follow up with PCP and cardiologist in one week 2. Follow-up with hematology and oncology 3. Please obtain BMP/CBC in one week 4. Please follow up on the following pending results:  Home Health: None Equipment/Devices: None  Discharge Condition: Stable CODE STATUS: Full code Diet recommendation: Cardiac and diabetic  HPI: Per Dr. Thurmond Butts is a 77 y.o. male with history of stroke, hypertension, hyperlipidemia was brought to the ER after patient felt dizzy at home.  Patient states he was cooking Thanksgiving lunch when he started developing dizziness.  He had to sit onto the recliner despite which he was still feeling dizzy for at least 3 minutes.  During episode he had mild chest tightness which resolved.  EMS was called and he was found to have blood pressure of 70 systolic.  Patient was brought to the ER.  He takes his blood pressure medications in the night.  ED Course: In the ER he was given 2 L normal saline bolus following which his blood pressure improved.  EKG shows sinus bradycardia at around 58 bpm.  Hemoglobin was found to be around 8 and his last hemoglobin in our system was many years ago which was normal.  Patient does not have any obvious bleeding as per the patient.  Had a recent health checkup with his primary care physician last week and was told that it was normal but does not recall his lab works.  Stool for occult blood is negative.  Patient admitted for near syncopal episode and anemia.  Hospital Course: 77 y.o.malewithhistory of stroke, hypertension, hyperlipidemia, normal heart cath about a year ago who was brought to the ER by EMS after near syncope that he describes as lightheadedness, chest  tightness and diaphoresis. SBP in 70s per EMS, 98/47 in ED and improved to 120s/70s after IV fluid.   Patient is on multiple blood pressure medications which her held throughout his hospital stay. EKG and troponin without acute ischemic finding. No significant event captured on telemetry throughout his stay. BNP 105  after IVF. No prior BNP to compare to. No Echocardiogram ordered and pending.  Patient remained free of chest pain, dyspnea, palpitation or lightheadedness throughout his stay.  Patient was noted to have macrocytic anemia with hemoglobin at 8.0 (unknown baseline) and MCV 120s. Reportedly had colonoscopy about 3 years ago with no significant finding.  Patient denies melena, hematochezia, constitutional symptoms, history of liver disease or alcohol use. Absolute reticulocyte 2.9 (adjusted to 1.94 hematocrit with reticulocyte index of 1.27 suggestive for hyperproliferation).  The rest of his anemia panel including vitamin W97 level and folic acid within normal. FOBT negative. LDH within normal limit.  Hematology consulted and ordered multiple blood work including blood smear, multiple myeloma panel, beta-2 microglobulin level and haptoglobin which are pending at the time of discharge.  Hematology to arrange outpatient follow-up.   Discharge Diagnoses:  Active Problems:   Essential hypertension, benign   Near syncope   Hypotension   Diabetes mellitus type 2 in nonobese (HCC)   Macrocytic anemia   Thrombocytopenia (HCC)   History of CVA in adulthood   Orthostatic hypotension   Monocytosis  Near syncope: Hypotensive on admission.  Still with positive orthostatic vitals after IV fluid.  BNP mildly elevated but without cardiopulmonary symptoms.  No  signs of fluid overload. -EKG without acute ischemic finding.   -Serial troponin negative. -BNP mildly elevated. -Follow echocardiogram -Held on home antihypertensive medication and started on Coreg 3.125 twice daily on  discharge.  Microcytic anemia with thrombocytopenia:  -As above  History of CVA: Stable. -Continue home aspirin and statin.  Hypertension: Currently with orthostatic hypotension.  On multiple antihypertensive regimen at home. -All home antihypertensive medication held.  Discharged on Coreg 3.125 mg twice daily.  Diabetes:  -Resume home meds on discharge.  Hyperlipidemia -Continue statin.   Discharge Instructions  Discharge Instructions    Call MD for:  difficulty breathing, headache or visual disturbances   Complete by:  As directed    Call MD for:  extreme fatigue   Complete by:  As directed    Call MD for:  persistant dizziness or light-headedness   Complete by:  As directed    Call MD for:  persistant nausea and vomiting   Complete by:  As directed    Call MD for:  redness, tenderness, or signs of infection (pain, swelling, redness, odor or green/yellow discharge around incision site)   Complete by:  As directed    Call MD for:  severe uncontrolled pain   Complete by:  As directed    Diet - low sodium heart healthy   Complete by:  As directed    Discharge instructions   Complete by:  As directed    You were admitted due to lightheadedness.  We think this is due to dehydration.  The test this we have done so far do not suggest this to come from your heart.  However, your echocardiogram has not been read yet. Dr. Einar Gip office will read this and contact you if this is concerning.  Meanwhile, we recommend stopping all your blood pressure medication except for the one we gave you. Please check your blood pressure everyday.  You do not need to take additional blood pressure medication as long as your blood pressure remains less than 130/80.  If your blood pressure is higher, please call your cardiologist or your primary care doctor for guidance.  We also noted that your hemoglobin and platelet levels are low.  Please follow-up with hematology.   Please refer to you updated  medication list before taking your medications.  Take care,   Increase activity slowly   Complete by:  As directed      Allergies as of 02/19/2018   No Known Allergies     Medication List    STOP taking these medications   amLODipine 5 MG tablet Commonly known as:  NORVASC   aspirin 81 MG tablet   hydrochlorothiazide 25 MG tablet Commonly known as:  HYDRODIURIL   losartan 50 MG tablet Commonly known as:  COZAAR   metoprolol succinate 50 MG 24 hr tablet Commonly known as:  TOPROL-XL     TAKE these medications   carvedilol 3.125 MG tablet Commonly known as:  COREG Take 1 tablet (3.125 mg total) by mouth 2 (two) times daily.   CENTRUM SILVER PO Take 1 tablet by mouth at bedtime.   JANUMET XR [phone] MG Tb24 Generic drug:  SitaGLIPtin-MetFORMIN HCl Take 1 tablet by mouth every evening.   JARDIANCE 25 MG Tabs tablet Generic drug:  empagliflozin Take 25 mg by mouth at bedtime.   rosuvastatin 20 MG tablet Commonly known as:  CRESTOR Take 20 mg by mouth at bedtime.      Follow-up Information    Burnard Bunting, MD Follow  up in 1 week(s).   Specialty:  Internal Medicine Contact information: [place] [address] [phone]        Adrian Prows, MD Follow up in 1 week(s).   Specialty:  Cardiology Contact information: [place] [address] [phone]           Consultations:    Procedures/Studies:  2D echo pending  Dg Chest 2 View  Result Date: 02/17/2018 CLINICAL DATA:  Syncope EXAM: CHEST - 2 VIEW COMPARISON:  06/08/2013 chest radiograph. FINDINGS: Stable cardiomediastinal silhouette with normal heart size. No pneumothorax. No pleural effusion. No pulmonary edema. Mild reticular opacities at the right greater than left lung bases, slightly increased. No acute consolidative airspace disease. IMPRESSION: Mild bibasilar reticular opacities, right greater than left, slightly increased, suggestive of nonspecific  mild fibrosis, cannot exclude interstitial lung disease. No acute cardiopulmonary disease. Electronically Signed   By: Ilona Sorrel M.D.   On: 02/17/2018 15:04      Subjective: No major events overnight.  No complaints this morning.  Denies chest pain, palpitation, dyspnea or lightheadedness.  Ambulated in the room and to the bathroom without symptoms.  Denies melena or hematochezia.  Discharge Exam: Vitals:   02/19/18 0345 02/19/18 1524  BP: 116/65 111/76  Pulse: 68 77  Resp: 15 17  Temp: 98.4 F (36.9 C) 98.4 F (36.9 C)  SpO2: 98%     GENERAL: Appears well. No acute distress.  HEENT: MMM.  Vision and Hearing grossly intact.  NECK: Supple.  No JVD.  LUNGS:  No IWOB. Good air movement. CTAB.  HEART:  RRR. Heart sounds normal. ABD: Bowel sounds present. Soft. Non tender.  EXT:   no edema bilaterally.  SKIN: no apparent skin lesion.  NEURO: Awake, alert and oriented appropriately.  No gross deficit.  PSYCH: Calm. Normal affect.   The results of significant diagnostics from this hospitalization (including imaging, microbiology, ancillary and laboratory) are listed below for reference.     Microbiology: No results found for this or any previous visit (from the past 240 hour(s)).   Labs: BNP (last 3 results) Recent Labs    02/18/18 0952  BNP 675.4*   Basic Metabolic Panel: Recent Labs  Lab 02/17/18 1418 02/18/18 0337  NA 136 138  K 4.0 3.5  CL 105 110  CO2 22 22  GLUCOSE 198* 121*  BUN 27* 21  CREATININE 1.26* 1.12  CALCIUM 9.1 8.8*   Liver Function Tests: Recent Labs  Lab 02/17/18 1418  AST 20  ALT 14  ALKPHOS 49  BILITOT 1.1  PROT 6.6  ALBUMIN 4.2   No results for input(s): LIPASE, AMYLASE in the last 168 hours. No results for input(s): AMMONIA in the last 168 hours. CBC: Recent Labs  Lab 02/17/18 1418 02/17/18 2357 02/18/18 0337 02/18/18 0750 02/19/18 0232  WBC 9.1 6.7 5.8 5.7 6.2  NEUTROABS 7.0  --   --   --   --   HGB 8.9* 8.2* 8.0*  8.4* 7.8*  HCT 28.5* 26.7* 24.9* 27.6* 25.2*  MCV 123.9* 121.9* 120.9* 121.6* 120.6*  PLT 126* 114* 112* 113* 104*   Cardiac Enzymes: Recent Labs  Lab 02/17/18 2357 02/18/18 1625  TROPONINI <0.03 <0.03   BNP: Invalid input(s): POCBNP CBG: Recent Labs  Lab 02/18/18 1707 02/18/18 2124 02/19/18 0805 02/19/18 1153 02/19/18 1640  GLUCAP 125* 183* 187* 210* 132*   D-Dimer No results for input(s): DDIMER in the last 72 hours. Hgb A1c No results for input(s): HGBA1C  in the last 72 hours. Lipid Profile No results for input(s): CHOL, HDL, LDLCALC, TRIG, CHOLHDL, LDLDIRECT in the last 72 hours. Thyroid function studies Recent Labs    02/17/18 2357  TSH 2.126   Anemia work up Recent Labs    02/17/18 1946 02/18/18 0952  VITAMINB12 673 701  FOLATE 41.8 32.2  FERRITIN 149 151  TIBC 330 309  IRON 89 92  RETICCTPCT 2.9 2.7   Urinalysis    Component Value Date/Time   COLORURINE YELLOW 02/17/2018 1758   APPEARANCEUR CLEAR 02/17/2018 1758   LABSPEC 1.025 02/17/2018 1758   PHURINE 5.0 02/17/2018 1758   GLUCOSEU >=500 (A) 02/17/2018 1758   HGBUR NEGATIVE 02/17/2018 1758   BILIRUBINUR NEGATIVE 02/17/2018 1758   KETONESUR NEGATIVE 02/17/2018 1758   PROTEINUR NEGATIVE 02/17/2018 1758   NITRITE NEGATIVE 02/17/2018 1758   LEUKOCYTESUR NEGATIVE 02/17/2018 1758   Sepsis Labs Invalid input(s): PROCALCITONIN,  WBC,  LACTICIDVEN   Time coordinating discharge: 35 minutes  SIGNED:  Mercy Riding, MD  [facility] 02/19/2018, 5:01 PM Pager [phone]  If 7PM-7AM, please contact night-coverage [email or web address] Password [email or web address]

## 2018-02-19 NOTE — Consult Note (Addendum)
Marland Kitchen    HEMATOLOGY/ONCOLOGY CONSULTATION NOTE  Date of Service: 02/19/2018  Patient Care Team: Burnard Bunting, MD as PCP - General (Internal Medicine)  CHIEF COMPLAINTS/PURPOSE OF CONSULTATION:   Macrocytic Anemia Thrombocytopenia  HISTORY OF PRESENTING ILLNESS:   Steve Maldonado is a wonderful 77 y.o. male who has been referred to [place] by Dr Cyndia Skeeters, Charlesetta Ivory, MD  for evaluation and management of macrocytic anemia and thrombocytopenia.  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  Patient was that he has been feeling well overall and was recently seen by Dr. Reynaldo Minium his primary care physician about 7 to 10 days prior to admission and is not aware of any abnormal labs at the time.  He does not recollect the exact blood counts at the time.  He does note that he had labs about 7 to 10 days prior to admission as well as 6 months prior to admission and there was no reported concerns on those.  Patient notes that he was cooking Thanksgiving lunch and developed lightheadedness and dizziness with presyncopal symptomatology.  He had to sit down on recliner and the dizziness still persisted for about 3 minutes and was associated with mild chest tightness resulting in a call to EMS who reportedly noted a blood pressure of 70 systolic. He reports that he had a had a good breakfast and was drinking enough water. He notes that prior to this episode he was feeling well and had no real new concerns or other symptoms.  In the emergency room his EKG showed sinus bradycardia with a heart rate of 58 bpm.  His blood pressure improved with fluid resuscitation.  Labs on admission -CBC showed a hemoglobin of 8.2 with an MCV of 122, RDW of 15, platelets of 114k, WBC counts within normal limits at 6.7k with normal differential.  He was noted to have some mild monocytosis of  1.2k. 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  B12 within normal limits at 673, folate of 41.8, TSH within normal limits at 2.12. Patient denies any history of liver disease. Patient denies significant alcohol use. No other new focal symptomatology.  Absolute reticulocyte count of 61k with increase in immature reticulocyte fraction. Denies any overt bleeding.  Normal bowel movements no hematochezia or melena. LDH was within normal limits at 175 and bilirubin was within normal limits and suggest against overt hemolysis.  Repeat labs today show hemoglobin is at 7.8 with platelet counts of 104k Patient notes no acute symptomatology and no overt dizziness while in bed. No bone pains.  No abnormal bruising. No fevers no chills no night sweats no abnormal weight loss. No other revealing focal symptomatology.   MEDICAL HISTORY:  Past Medical History:  Diagnosis Date  . Hyperlipidemia   . Hypertension   . Stroke [facility]) 1970    SURGICAL HISTORY: Past Surgical History:  Procedure Laterality Date  . APPENDECTOMY  1980  . INTRAVASCULAR ULTRASOUND/IVUS N/A 12/10/2016   Procedure: Intravascular Ultrasound/IVUS;  Surgeon: Nigel Mormon, MD;  Location: [place] [facility];  Service: Cardiovascular;  Laterality: N/A;  . LEFT HEART CATH AND CORONARY ANGIOGRAPHY N/A 12/10/2016   Procedure: LEFT HEART CATH AND CORONARY ANGIOGRAPHY;  Surgeon: Nigel Mormon, MD;  Location: [place] [facility];  Service: Cardiovascular;  Laterality: N/A;    SOCIAL HISTORY: Social History   Socioeconomic History  . Marital  status: Single    Spouse name: Not on file  . Number of children: Not on file  . Years of education: Not on file  . Highest education level: Not on file  Occupational History  . Not on file  Social Needs  . Financial resource strain: Not on file  . Food insecurity:    Worry: Not on file    Inability: Not on file  .  Transportation needs:    Medical: Not on file    Non-medical: Not on file  Tobacco Use  . Smoking status: Former Smoker    Packs/day: 1.00    Years: 20.00    Pack years: 20.00    Types: Cigarettes    Last attempt to quit: 03/23/1978    Years since quitting: 39.9  . Smokeless tobacco: Never Used  Substance and Sexual Activity  . Alcohol use: Yes    Alcohol/week: 0.0 standard drinks    Comment: wine/has not had any in 1 year  . Drug use: No  . Sexual activity: Not on file  Lifestyle  . Physical activity:    Days per week: Not on file    Minutes per session: Not on file  . Stress: Not on file  Relationships  . Social connections:    Talks on phone: Not on file    Gets together: Not on file    Attends religious service: Not on file    Active member of club or organization: Not on file    Attends meetings of clubs or organizations: Not on file    Relationship status: Not on file  . Intimate partner violence:    Fear of current or ex partner: Not on file    Emotionally abused: Not on file    Physically abused: Not on file    Forced sexual activity: Not on file  Other Topics Concern  . Not on file  Social History Narrative  . Not on file    FAMILY HISTORY: Family History  Problem Relation Age of Onset  . Colon cancer Neg Hx   . Esophageal cancer Neg Hx   . Rectal cancer Neg Hx   . Stomach cancer Neg Hx     ALLERGIES:  has No Known Allergies.  MEDICATIONS:  Current Facility-Administered Medications  Medication Dose Route Frequency Provider Last Rate Last Dose  . acetaminophen (TYLENOL) tablet 650 mg  650 mg Oral Q6H PRN Mercy Riding, MD       Or  . acetaminophen (TYLENOL) suppository 650 mg  650 mg Rectal Q6H PRN Gonfa, Taye T, MD      . hydrALAZINE (APRESOLINE) injection 10 mg  10 mg Intravenous Q4H PRN Gonfa, Taye T, MD      . insulin aspart (novoLOG) injection 0-9 Units  0-9 Units Subcutaneous TID WC Mercy Riding, MD   2 Units at 02/19/18 0829  . ondansetron  (ZOFRAN) tablet 4 mg  4 mg Oral Q6H PRN Gonfa, Taye T, MD       Or  . ondansetron (ZOFRAN) injection 4 mg  4 mg Intravenous Q6H PRN Gonfa, Taye T, MD      . rosuvastatin (CRESTOR) tablet 20 mg  20 mg Oral QHS Wendee Beavers T, MD   20 mg at 02/18/18 2208    REVIEW OF SYSTEMS:    10 Point review of Systems was done is negative except as noted above.  PHYSICAL EXAMINATION: ECOG PERFORMANCE STATUS: 1 - Symptomatic but completely ambulatory  . Vitals:  02/18/18 2328 02/19/18 0345  BP: 119/69 116/65  Pulse: 60 68  Resp: 15 15  Temp: 98.2 F (36.8 C) 98.4 F (36.9 C)  SpO2: 99% 98%   Filed Weights   02/17/18 1414 02/18/18 2026  Weight: 185 lb (83.9 kg) 183 lb 6.8 oz (83.2 kg)   .Body mass index is 22.93 kg/m.  GENERAL:alert, in no acute distress and comfortable SKIN: no acute rashes, no significant lesions EYES: conjunctival pallor noted.  No scleral icterus. OROPHARYNX: MMM, no exudates, no oropharyngeal erythema or ulceration NECK: supple, no JVD LYMPH:  no palpable lymphadenopathy in the cervical, axillary or inguinal regions LUNGS: clear to auscultation b/l with normal respiratory effort HEART: regular rate & rhythm ABDOMEN:  normoactive bowel sounds , non tender, not distended.  No palpable hepatosplenomegaly. Extremity: no pedal edema PSYCH: alert & oriented x 3 with fluent speech NEURO: no focal motor/sensory deficits  LABORATORY DATA:  I have reviewed the data as listed  . CBC Latest Ref Rng & Units 02/19/2018 02/18/2018 02/18/2018  WBC 4.0 - 10.5 K/uL 6.2 5.7 5.8  Hemoglobin 13.0 - 17.0 g/dL 7.8(L) 8.4(L) 8.0(L)  Hematocrit 39.0 - 52.0 % 25.2(L) 27.6(L) 24.9(L)  Platelets 150 - 400 K/uL 104(L) 113(L) 112(L)    . CMP Latest Ref Rng & Units 02/18/2018 02/17/2018  Glucose 70 - 99 mg/dL 121(H) 198(H)  BUN 8 - 23 mg/dL 21 27(H)  Creatinine 0.61 - 1.24 mg/dL 1.12 1.26(H)  Sodium 135 - 145 mmol/L 138 136  Potassium 3.5 - 5.1 mmol/L 3.5 4.0  Chloride 98 - 111  mmol/L 110 105  CO2 22 - 32 mmol/L 22 22  Calcium 8.9 - 10.3 mg/dL 8.8(L) 9.1  Total Protein 6.5 - 8.1 g/dL - 6.6  Total Bilirubin 0.3 - 1.2 mg/dL - 1.1  Alkaline Phos 38 - 126 U/L - 49  AST 15 - 41 U/L - 20  ALT 0 - 44 U/L - 14   Component     Latest Ref Rng & Units 02/17/2018 02/18/2018 02/19/2018  WBC     4.0 - 10.5 K/uL 6.7    RBC     4.22 - 5.81 MIL/uL 2.19 (L)    Hemoglobin     13.0 - 17.0 g/dL 8.2 (L)    HCT     39.0 - 52.0 % 26.7 (L)    MCV     80.0 - 100.0 fL 121.9 (H)    MCH     26.0 - 34.0 pg 37.4 (H)    MCHC     30.0 - 36.0 g/dL 30.7    RDW     11.5 - 15.5 % 15.0    Platelets     150 - 400 K/uL 114 (L)    nRBC     0.0 - 0.2 % 0.0    Retic Ct Pct     0.4 - 3.1 % 2.9 2.7   RBC.     4.22 - 5.81 MIL/uL 2.13 (L) 2.09 (L)   Retic Count, Absolute     19.0 - 186.0 K/uL 61.3 56.8   Immature Retic Fract     2.3 - 15.9 % 25.3 (H) 22.5 (H)   Iron     45 - 182 ug/dL  92   TIBC     250 - 450 ug/dL  309   Saturation Ratios     17.9 - 39.5 %  30   UIBC     ug/dL  217   TSH  0.350 - 4.500 uIU/mL 2.126    Vitamin B12     180 - 914 pg/mL  701   Folate     >5.9 ng/mL  32.2   Ferritin     24 - 336 ng/mL  151   LDH     98 - 192 U/L   175   Component     Latest Ref Rng & Units 02/17/2018  Fecal Occult Blood, POC     NEGATIVE NEGATIVE    RADIOGRAPHIC STUDIES: I have personally reviewed the radiological images as listed and agreed with the findings in the report. Dg Chest 2 View  Result Date: 02/17/2018 CLINICAL DATA:  Syncope EXAM: CHEST - 2 VIEW COMPARISON:  06/08/2013 chest radiograph. FINDINGS: Stable cardiomediastinal silhouette with normal heart size. No pneumothorax. No pleural effusion. No pulmonary edema. Mild reticular opacities at the right greater than left lung bases, slightly increased. No acute consolidative airspace disease. IMPRESSION: Mild bibasilar reticular opacities, right greater than left, slightly increased, suggestive of nonspecific  mild fibrosis, cannot exclude interstitial lung disease. No acute cardiopulmonary disease. Electronically Signed   By: Ilona Sorrel M.D.   On: 02/17/2018 15:04    ASSESSMENT & PLAN:   77 year old overall healthy retired [place] with  #1 Significant Macrocytic Anemia No overt evidence of hemolysis with normal bilirubin and LDH level. No overt clinical evidence of acute bleeding.  Fecal occult blood test x1-.  Clinically no reported bleeding. Ferritin is in normal limits. B12 and folate within normal limits.  Normal RDW suggests against overt nutritional deficiency. TSH within normal limits Peripheral blood smear evidence of anisopoikilocytosis.  #2 Mild thrombocytopenia platelets in the 100-120k range with no overt evidence of bleeding.  Plan -Discussed on lab results available with the patient. -Unclear chronicity of blood count abnormalities the patient is relatively asymptomatic at this time with regards to his anemia at rest. -Not in reasonable transfuse patient to keep hemoglobin closer to 9 given presyncopal symptomatology and gradual downward drift of his hgb. -Cardiac evaluation as per hospitalist to rule out other etiologies of presyncopal symptoms including symptomatic bradycardia. -Since there is no overt clear reason for the patient's microcytic anemia and given the patient's age there is certainly concern for possible MDS and other bone marrow infiltrative etiologies for his macrocytic anemia and thrombocytopenia. -please obtain outside labs from Dr Darrow Bussing office (from 7-10 days ago and 46months ago) to determine chronicity of his anemia/thrombocytopenia. -We discussed and patient is agreeable to pursue a use bone marrow aspiration/biopsy by interventional radiology on Monday (ordered this). -we will setup patient to f/u with [place] in clinic in 7-10 days.   All of the patients questions were answered with apparent satisfaction. The patient knows to call the clinic with  any problems, questions or concerns.  I spent 60 minutes counseling the patient face to face. The total time spent in the appointment was 80 minutes and more than 50% was on counseling and direct patient cares.    Sullivan Lone MD [place] AAHIVMS [facility] Hematology/Oncology Physician [facility] [place]  (Office):       [phone] (Work cell):  [phone] (Fax):           [phone]  02/19/2018 12:33 PM

## 2018-02-20 LAB — ECHOCARDIOGRAM COMPLETE
Height: 75 in
Weight: 2960 oz

## 2018-02-20 LAB — BETA 2 MICROGLOBULIN, SERUM: Beta-2 Microglobulin: 1.8 mg/L (ref 0.6–2.4)

## 2018-02-20 LAB — HAPTOGLOBIN: HAPTOGLOBIN: 105 mg/dL (ref 34–200)

## 2018-02-21 ENCOUNTER — Other Ambulatory Visit: Payer: Self-pay | Admitting: *Deleted

## 2018-02-21 ENCOUNTER — Other Ambulatory Visit: Payer: Self-pay | Admitting: Hematology

## 2018-02-21 DIAGNOSIS — D696 Thrombocytopenia, unspecified: Secondary | ICD-10-CM

## 2018-02-21 DIAGNOSIS — D539 Nutritional anemia, unspecified: Secondary | ICD-10-CM

## 2018-02-21 LAB — PROTEIN ELECTROPHORESIS, SERUM
A/G RATIO SPE: 1.9 — AB (ref 0.7–1.7)
Albumin ELP: 3.9 g/dL (ref 2.9–4.4)
Alpha-1-Globulin: 0.1 g/dL (ref 0.0–0.4)
Alpha-2-Globulin: 0.5 g/dL (ref 0.4–1.0)
Beta Globulin: 0.9 g/dL (ref 0.7–1.3)
GLOBULIN, TOTAL: 2.1 g/dL — AB (ref 2.2–3.9)
Gamma Globulin: 0.6 g/dL (ref 0.4–1.8)
Total Protein ELP: 6 g/dL (ref 6.0–8.5)

## 2018-02-21 LAB — KAPPA/LAMBDA LIGHT CHAINS
Kappa free light chain: 17.5 mg/L (ref 3.3–19.4)
Kappa, lambda light chain ratio: 1.32 (ref 0.26–1.65)
Lambda free light chains: 13.3 mg/L (ref 5.7–26.3)

## 2018-02-21 LAB — POCT I-STAT TROPONIN I: Troponin i, poc: 0.01 ng/mL (ref 0.00–0.08)

## 2018-02-22 ENCOUNTER — Other Ambulatory Visit: Payer: Self-pay | Admitting: Radiology

## 2018-02-22 ENCOUNTER — Telehealth: Payer: Self-pay | Admitting: Hematology

## 2018-02-22 ENCOUNTER — Encounter: Payer: Self-pay | Admitting: Hematology

## 2018-02-22 LAB — MULTIPLE MYELOMA PANEL, SERUM
Albumin SerPl Elph-Mcnc: 3.8 g/dL (ref 2.9–4.4)
Albumin/Glob SerPl: 1.7 (ref 0.7–1.7)
Alpha 1: 0.1 g/dL (ref 0.0–0.4)
Alpha2 Glob SerPl Elph-Mcnc: 0.6 g/dL (ref 0.4–1.0)
B-Globulin SerPl Elph-Mcnc: 0.9 g/dL (ref 0.7–1.3)
Gamma Glob SerPl Elph-Mcnc: 0.7 g/dL (ref 0.4–1.8)
Globulin, Total: 2.3 g/dL (ref 2.2–3.9)
IGG (IMMUNOGLOBIN G), SERUM: 786 mg/dL (ref 700–1600)
IgA: 174 mg/dL (ref 61–437)
IgM (Immunoglobulin M), Srm: 57 mg/dL (ref 15–143)
Total Protein ELP: 6.1 g/dL (ref 6.0–8.5)

## 2018-02-22 NOTE — Telephone Encounter (Signed)
Pt has been scheduled for a hospital follow up appt to see Dr. Irene Limbo on 12/10 at 11am and 1030am for labs. I cld and lft the appt date and time on the pt's vm. Letter mailed.

## 2018-02-23 ENCOUNTER — Ambulatory Visit (HOSPITAL_COMMUNITY)
Admission: RE | Admit: 2018-02-23 | Discharge: 2018-02-23 | Disposition: A | Discharge status: Home / Self Care | Payer: Medicare Other | Source: Ambulatory Visit | Attending: Hematology | Admitting: Hematology

## 2018-02-23 ENCOUNTER — Encounter (HOSPITAL_COMMUNITY): Payer: Self-pay

## 2018-02-23 DIAGNOSIS — D539 Nutritional anemia, unspecified: Secondary | ICD-10-CM | POA: Diagnosis not present

## 2018-02-23 DIAGNOSIS — Z8673 Personal history of transient ischemic attack (TIA), and cerebral infarction without residual deficits: Secondary | ICD-10-CM | POA: Diagnosis not present

## 2018-02-23 DIAGNOSIS — D696 Thrombocytopenia, unspecified: Secondary | ICD-10-CM | POA: Diagnosis not present

## 2018-02-23 DIAGNOSIS — Z79899 Other long term (current) drug therapy: Secondary | ICD-10-CM | POA: Insufficient documentation

## 2018-02-23 DIAGNOSIS — Z9889 Other specified postprocedural states: Secondary | ICD-10-CM | POA: Insufficient documentation

## 2018-02-23 DIAGNOSIS — Z87891 Personal history of nicotine dependence: Secondary | ICD-10-CM | POA: Diagnosis not present

## 2018-02-23 DIAGNOSIS — D72821 Monocytosis (symptomatic): Secondary | ICD-10-CM | POA: Insufficient documentation

## 2018-02-23 DIAGNOSIS — Z7984 Long term (current) use of oral hypoglycemic drugs: Secondary | ICD-10-CM | POA: Insufficient documentation

## 2018-02-23 DIAGNOSIS — I10 Essential (primary) hypertension: Secondary | ICD-10-CM | POA: Insufficient documentation

## 2018-02-23 DIAGNOSIS — E785 Hyperlipidemia, unspecified: Secondary | ICD-10-CM | POA: Insufficient documentation

## 2018-02-23 LAB — CBC WITH DIFFERENTIAL/PLATELET
Abs Immature Granulocytes: 0.03 10*3/uL (ref 0.00–0.07)
Basophils Absolute: 0 10*3/uL (ref 0.0–0.1)
Basophils Relative: 0 %
Eosinophils Absolute: 0 10*3/uL (ref 0.0–0.5)
Eosinophils Relative: 0 %
HCT: 27.7 % — ABNORMAL LOW (ref 39.0–52.0)
Hemoglobin: 8.8 g/dL — ABNORMAL LOW (ref 13.0–17.0)
Immature Granulocytes: 0 %
Lymphocytes Relative: 9 %
Lymphs Abs: 0.7 10*3/uL (ref 0.7–4.0)
MCH: 39.1 pg — ABNORMAL HIGH (ref 26.0–34.0)
MCHC: 31.8 g/dL (ref 30.0–36.0)
MCV: 123.1 fL — ABNORMAL HIGH (ref 80.0–100.0)
Monocytes Absolute: 1.1 10*3/uL — ABNORMAL HIGH (ref 0.1–1.0)
Monocytes Relative: 14 %
Neutro Abs: 6 10*3/uL (ref 1.7–7.7)
Neutrophils Relative %: 77 %
Platelets: 128 10*3/uL — ABNORMAL LOW (ref 150–400)
RBC: 2.25 MIL/uL — ABNORMAL LOW (ref 4.22–5.81)
RDW: 15.3 % (ref 11.5–15.5)
WBC: 7.8 10*3/uL (ref 4.0–10.5)
nRBC: 0 % (ref 0.0–0.2)

## 2018-02-23 LAB — PROTIME-INR
INR: 0.98
Prothrombin Time: 12.9 seconds (ref 11.4–15.2)

## 2018-02-23 LAB — APTT: aPTT: 30 seconds (ref 24–36)

## 2018-02-23 MED ORDER — LIDOCAINE HCL (PF) 1 % IJ SOLN
INTRAMUSCULAR | Status: AC | PRN
  Administered 2018-02-23: 10 mL via SUBCUTANEOUS

## 2018-02-23 MED ORDER — FENTANYL CITRATE (PF) 100 MCG/2ML IJ SOLN
INTRAMUSCULAR | Status: AC
  Administered 2018-02-23: 50 ug
  Filled 2018-02-23: qty 4

## 2018-02-23 MED ORDER — SODIUM CHLORIDE 0.9 % IV SOLN
INTRAVENOUS | Status: DC
  Administered 2018-02-23: 08:00:00 via INTRAVENOUS

## 2018-02-23 NOTE — Consult Note (Addendum)
Chief Complaint: Patient was seen in consultation today for CT guided bone marrow biopsy   Referring Physician(s): Brunetta Genera  Supervising Physician: Daryll Brod  Patient Status: [facility] - Out-pt  History of Present Illness: Steve Maldonado is a 77 y.o. male with history of macrocytic anemia and thrombocytopenia of unknown etiology who presents today for CT-guided bone marrow biopsy for further evaluation/rule out MDS.  Past Medical History:  Diagnosis Date  . Hyperlipidemia   . Hypertension   . Stroke [facility]) 1970    Past Surgical History:  Procedure Laterality Date  . APPENDECTOMY  1980  . INTRAVASCULAR ULTRASOUND/IVUS N/A 12/10/2016   Procedure: Intravascular Ultrasound/IVUS;  Surgeon: Nigel Mormon, MD;  Location: [place] [facility];  Service: Cardiovascular;  Laterality: N/A;  . LEFT HEART CATH AND CORONARY ANGIOGRAPHY N/A 12/10/2016   Procedure: LEFT HEART CATH AND CORONARY ANGIOGRAPHY;  Surgeon: Nigel Mormon, MD;  Location: [place] [facility];  Service: Cardiovascular;  Laterality: N/A;    Allergies: Patient has no known allergies.  Medications: Prior to Admission medications   Medication Sig Start Date End Date Taking? Authorizing Provider  carvedilol (COREG) 3.125 MG tablet Take 1 tablet (3.125 mg total) by mouth 2 (two) times daily. 02/19/18 03/21/18 Yes Mercy Riding, MD  JARDIANCE 25 MG TABS tablet Take 25 mg by mouth at bedtime. 02/15/18  Yes [provider]  Multiple Vitamins-Minerals (CENTRUM SILVER PO) Take 1 tablet by mouth at bedtime.    Yes [provider]  rosuvastatin (CRESTOR) 20 MG tablet Take 20 mg by mouth at bedtime. 01/17/18  Yes [provider]  SitaGLIPtin-MetFORMIN HCl (JANUMET XR) [phone] MG TB24 Take 1 tablet by mouth every evening.   Yes [provider]     Family History  Problem Relation Age of Onset  . Colon cancer Neg Hx   . Esophageal cancer Neg Hx   . Rectal cancer Neg Hx    . Stomach cancer Neg Hx     Social History   Socioeconomic History  . Marital status: Single    Spouse name: Not on file  . Number of children: Not on file  . Years of education: Not on file  . Highest education level: Not on file  Occupational History  . Not on file  Social Needs  . Financial resource strain: Not on file  . Food insecurity:    Worry: Not on file    Inability: Not on file  . Transportation needs:    Medical: Not on file    Non-medical: Not on file  Tobacco Use  . Smoking status: Former Smoker    Packs/day: 1.00    Years: 20.00    Pack years: 20.00    Types: Cigarettes    Last attempt to quit: 03/23/1978    Years since quitting: 39.9  . Smokeless tobacco: Never Used  Substance and Sexual Activity  . Alcohol use: Yes    Alcohol/week: 0.0 standard drinks    Comment: wine/has not had any in 1 year  . Drug use: No  . Sexual activity: Not on file  Lifestyle  . Physical activity:    Days per week: Not on file    Minutes per session: Not on file  . Stress: Not on file  Relationships  . Social connections:    Talks on phone: Not on file    Gets together: Not on file    Attends religious service: Not on file    Active member of  club or organization: Not on file    Attends meetings of clubs or organizations: Not on file    Relationship status: Not on file  Other Topics Concern  . Not on file  Social History Narrative  . Not on file     Review of Systems denies fever, headache, chest pain, cough, abdominal/back pain, nausea, vomiting or bleeding.  Does have some dyspnea with exertion.  Vital Signs: Ht 6' 3"  (1.905 m)   Wt 185 lb (83.9 kg)   BMI 23.12 kg/m Blood pressure 130/90, temp 97.5, heart rate 76, respirations 15  Physical Exam awake, alert.  Chest clear to auscultation bilaterally.  Heart with regular rate and rhythm.  Abdomen soft, positive bowel sounds, nontender.  No lower extremity edema.  Imaging: Dg Chest 2 View  Result Date:  02/17/2018 CLINICAL DATA:  Syncope EXAM: CHEST - 2 VIEW COMPARISON:  06/08/2013 chest radiograph. FINDINGS: Stable cardiomediastinal silhouette with normal heart size. No pneumothorax. No pleural effusion. No pulmonary edema. Mild reticular opacities at the right greater than left lung bases, slightly increased. No acute consolidative airspace disease. IMPRESSION: Mild bibasilar reticular opacities, right greater than left, slightly increased, suggestive of nonspecific mild fibrosis, cannot exclude interstitial lung disease. No acute cardiopulmonary disease. Electronically Signed   By: Ilona Sorrel M.D.   On: 02/17/2018 15:04    Labs:  CBC: Recent Labs    02/17/18 2357 02/18/18 0337 02/18/18 0750 02/19/18 0232  WBC 6.7 5.8 5.7 6.2  HGB 8.2* 8.0* 8.4* 7.8*  HCT 26.7* 24.9* 27.6* 25.2*  PLT 114* 112* 113* 104*    COAGS: Recent Labs    02/23/18 0750  INR 0.98  APTT 30    BMP: Recent Labs    02/17/18 1418 02/18/18 0337  NA 136 138  K 4.0 3.5  CL 105 110  CO2 22 22  GLUCOSE 198* 121*  BUN 27* 21  CALCIUM 9.1 8.8*  CREATININE 1.26* 1.12  GFRNONAA 54* >60  GFRAA >60 >60    LIVER FUNCTION TESTS: Recent Labs    02/17/18 1418  BILITOT 1.1  AST 20  ALT 14  ALKPHOS 49  PROT 6.6  ALBUMIN 4.2    TUMOR MARKERS: No results for input(s): AFPTM, CEA, CA199, CHROMGRNA in the last 8760 hours.  Assessment and Plan: 77 y.o. male with history of macrocytic anemia and thrombocytopenia of unknown etiology who presents today for CT-guided bone marrow biopsy for further evaluation/rule out MDS.Risks and benefits discussed with the patient including, but not limited to bleeding, infection, damage to adjacent structures or low yield requiring additional tests.  All of the patient's questions were answered, patient is agreeable to proceed. Consent signed and in chart.  Patient drank 1 glass of milk this morning therefore will receive IV fentanyl only.   Thank you for this  interesting consult.  I greatly enjoyed meeting Steve Maldonado and look forward to participating in their care.  A copy of this report was sent to the requesting provider on this date.  Electronically Signed: D. Rowe Robert, PA-C 02/23/2018, 8:39 AM   I spent a total of 20 minutes    in face to face in clinical consultation, greater than 50% of which was counseling/coordinating care for CT-guided bone marrow biopsy

## 2018-02-23 NOTE — Procedures (Signed)
Anemia, thrombocytopenia  S/p CT BM ASP AND CORE BX  No comp Stable EBL min Path pending Full report in pacs

## 2018-02-23 NOTE — Discharge Instructions (Signed)

## 2018-03-01 ENCOUNTER — Inpatient Hospital Stay: Payer: Medicare Other | Attending: Hematology | Admitting: Hematology

## 2018-03-01 ENCOUNTER — Inpatient Hospital Stay: Payer: Medicare Other

## 2018-03-01 ENCOUNTER — Encounter: Payer: Self-pay | Admitting: Hematology

## 2018-03-01 ENCOUNTER — Telehealth: Payer: Self-pay

## 2018-03-01 VITALS — BP 128/87 | HR 81 | Temp 97.8°F | Resp 16 | Ht 75.0 in | Wt 185.2 lb

## 2018-03-01 DIAGNOSIS — Z79899 Other long term (current) drug therapy: Secondary | ICD-10-CM | POA: Insufficient documentation

## 2018-03-01 DIAGNOSIS — I10 Essential (primary) hypertension: Secondary | ICD-10-CM | POA: Diagnosis not present

## 2018-03-01 DIAGNOSIS — D469 Myelodysplastic syndrome, unspecified: Secondary | ICD-10-CM

## 2018-03-01 DIAGNOSIS — E785 Hyperlipidemia, unspecified: Secondary | ICD-10-CM | POA: Insufficient documentation

## 2018-03-01 DIAGNOSIS — D649 Anemia, unspecified: Secondary | ICD-10-CM | POA: Diagnosis not present

## 2018-03-01 DIAGNOSIS — Z87891 Personal history of nicotine dependence: Secondary | ICD-10-CM | POA: Diagnosis not present

## 2018-03-01 DIAGNOSIS — D696 Thrombocytopenia, unspecified: Secondary | ICD-10-CM

## 2018-03-01 LAB — CBC WITH DIFFERENTIAL (CANCER CENTER ONLY)
Abs Immature Granulocytes: 0.03 10*3/uL (ref 0.00–0.07)
Basophils Absolute: 0 10*3/uL (ref 0.0–0.1)
Basophils Relative: 0 %
Eosinophils Absolute: 0 10*3/uL (ref 0.0–0.5)
Eosinophils Relative: 1 %
HCT: 28 % — ABNORMAL LOW (ref 39.0–52.0)
Hemoglobin: 8.9 g/dL — ABNORMAL LOW (ref 13.0–17.0)
IMMATURE GRANULOCYTES: 0 %
Lymphocytes Relative: 12 %
Lymphs Abs: 0.8 10*3/uL (ref 0.7–4.0)
MCH: 39 pg — ABNORMAL HIGH (ref 26.0–34.0)
MCHC: 31.8 g/dL (ref 30.0–36.0)
MCV: 122.8 fL — ABNORMAL HIGH (ref 80.0–100.0)
Monocytes Absolute: 1 10*3/uL (ref 0.1–1.0)
Monocytes Relative: 14 %
NEUTROS ABS: 5.2 10*3/uL (ref 1.7–7.7)
Neutrophils Relative %: 73 %
Platelet Count: 150 10*3/uL (ref 150–400)
RBC: 2.28 MIL/uL — ABNORMAL LOW (ref 4.22–5.81)
RDW: 15.2 % (ref 11.5–15.5)
WBC Count: 7.1 10*3/uL (ref 4.0–10.5)
nRBC: 0.4 % — ABNORMAL HIGH (ref 0.0–0.2)

## 2018-03-01 LAB — CMP (CANCER CENTER ONLY)
ALBUMIN: 4.4 g/dL (ref 3.5–5.0)
ALT: 14 U/L (ref 0–44)
AST: 15 U/L (ref 15–41)
Alkaline Phosphatase: 65 U/L (ref 38–126)
Anion gap: 9 (ref 5–15)
BUN: 21 mg/dL (ref 8–23)
CO2: 25 mmol/L (ref 22–32)
Calcium: 9.9 mg/dL (ref 8.9–10.3)
Chloride: 106 mmol/L (ref 98–111)
Creatinine: 1.26 mg/dL — ABNORMAL HIGH (ref 0.61–1.24)
GFR, Est AFR Am: >60 mL/min (ref >60)
GFR, Estimated: 54 mL/min — ABNORMAL LOW (ref >60)
GLUCOSE: 167 mg/dL — AB (ref 70–99)
Potassium: 4.4 mmol/L (ref 3.5–5.1)
SODIUM: 140 mmol/L (ref 135–145)
Total Bilirubin: 1 mg/dL (ref 0.3–1.2)
Total Protein: 7.4 g/dL (ref 6.5–8.1)

## 2018-03-01 NOTE — Telephone Encounter (Signed)
Printed avs and calender of upcoming appointment, per 12/10 los

## 2018-03-01 NOTE — Patient Instructions (Signed)
Thank you for choosing [facility] to provide your oncology and hematology care.  To afford each patient quality time with our providers, please arrive 30 minutes before your scheduled appointment time.  If you arrive late for your appointment, you may be asked to reschedule.  We strive to give you quality time with our providers, and arriving late affects you and other patients whose appointments are after yours.    If you are a no show for multiple scheduled visits, you may be dismissed from the clinic at the providers discretion.     Again, thank you for choosing [facility], our hope is that these requests will decrease the amount of time that you wait before being seen by our physicians.  ______________________________________________________________________   Should you have questions after your visit to the [facility], please contact our office at [phone] between the hours of 8:30 and 4:30 p.m.    Voicemails left after 4:30p.m will not be returned until the following business day.     For prescription refill requests, please have your pharmacy contact us directly.  Please also try to allow 48 hours for prescription requests.     Please contact the scheduling department for questions regarding scheduling.  For scheduling of procedures such as PET scans, CT scans, MRI, Ultrasound, etc please contact central scheduling at [phone].     Resources For Cancer Patients and Caregivers:    Oncolink.org:  A wonderful resource for patients and healthcare providers for information regarding your disease, ways to tract your treatment, what to expect, etc.      American Cancer Society:  800-227-2345  Can help patients locate various types of support and financial assistance   Cancer Care: 1-800-813-HOPE (4673) Provides financial assistance, online support groups, medication/co-pay assistance.     [place] DSS:  336-641-3447 Where to apply  for food stamps, Medicaid, and utility assistance   Medicare Rights Center: 800-333-4114 Helps people with Medicare understand their rights and benefits, navigate the Medicare system, and secure the quality healthcare they deserve   SCAT: 336-333-6589 [place] [employer]'s shared-ride transportation service for eligible riders who have a disability that prevents them from riding the fixed route bus.     For additional information on assistance programs please contact our social worker:   Abigail Elmore:  [phone]  

## 2018-03-01 NOTE — Progress Notes (Signed)
HEMATOLOGY/ONCOLOGY CONSULTATION NOTE  Date of Service: 03/01/2018  Patient Care Team: Burnard Bunting, MD as PCP - General (Internal Medicine)  CHIEF COMPLAINTS/PURPOSE OF CONSULTATION:  Macrocytic Anemia Thrombocytopenia   HISTORY OF PRESENTING ILLNESS:  Steve Maldonado is a wonderful 77 y.o. male who has been referred to [place] by Dr Cyndia Skeeters, Charlesetta Ivory, MD  for evaluation and management of macrocytic anemia and thrombocytopenia.  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  Patient was that he has been feeling well overall and was recently seen by Dr. Reynaldo Minium his primary care physician about 7 to 10 days prior to admission and is not aware of any abnormal labs at the time.  He does not recollect the exact blood counts at the time.  He does note that he had labs about 7 to 10 days prior to admission as well as 6 months prior to admission and there was no reported concerns on those.  Patient notes that he was cooking Thanksgiving lunch and developed lightheadedness and dizziness with presyncopal symptomatology.  He had to sit down on recliner and the dizziness still persisted for about 3 minutes and was associated with mild chest tightness resulting in a call to EMS who reportedly noted a blood pressure of 70 systolic. He reports that he had a had a good breakfast and was drinking enough water. He notes that prior to this episode he was feeling well and had no real new concerns or other symptoms.  In the emergency room his EKG showed sinus bradycardia with a heart rate of 58 bpm.  His blood pressure improved with fluid resuscitation.  Labs on admission -CBC showed a hemoglobin of 8.2 with an MCV of 122, RDW of 15, platelets of 114k, WBC counts within normal limits at 6.7k with normal differential.  He was noted to have some mild monocytosis of  1.2k. 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  B12 within normal limits at 673, folate of 41.8, TSH within normal limits at 2.12. Patient denies any history of liver disease. Patient denies significant alcohol use. No other new focal symptomatology.  Absolute reticulocyte count of 61k with increase in immature reticulocyte fraction. Denies any overt bleeding.  Normal bowel movements no hematochezia or melena. LDH was within normal limits at 175 and bilirubin was within normal limits and suggest against overt hemolysis.  Repeat labs today show hemoglobin is at 7.8 with platelet counts of 104k Patient notes no acute symptomatology and no overt dizziness while in bed. No bone pains.  No abnormal bruising. No fevers no chills no night sweats no abnormal weight loss. No other revealing focal symptomatology.  Interval History:   Steve Maldonado returns today for management and evaluation of his macrocytic anemia and thrombocytopenia. I last saw the patient on 02/19/18 as an outpatient. The pt reports that he is doing well overall.   The pt reports that he has not developed any new concerns in the interim. He notes that he feels fine, and has not had any blood transfusions. He also denies any recent infections.   The patient takes a multi-vitamin every day and has not recently begun any new medications.   The pt notes that he has been staying very well hydrated and denies any returned feelings of light headedness or dizziness.   The pt notes that he has lost about  4-5 pounds in the last several weeks and does not necessarily attribute this to anything in particular.   Of note since the patient's last visit, pt has had a BM Bx completed on 02/23/18 with results revealing hypercellular bone marrow for age with dyspoietic changes.  Lab results today (03/01/18) of CBC w/diff and CMP is as follows: all values are WNL except for RBC at 2.28, HGB at  8.9, HCT at 28.0, MCV at 122.8, MCH at 39.0, nRBC at 0.4%, Glucose at 167, Creatinine at 1.26, GFR at 54.  On review of systems, pt reports eating well, strong appetite, staying well hydrated, good energy levels, and denies dizziness, light headedness, abdominal pains, leg swelling, and any other symptoms.    MEDICAL HISTORY:  Past Medical History:  Diagnosis Date  . Hyperlipidemia   . Hypertension   . Stroke [facility]) 1970    SURGICAL HISTORY: Past Surgical History:  Procedure Laterality Date  . APPENDECTOMY  1980  . INTRAVASCULAR ULTRASOUND/IVUS N/A 12/10/2016   Procedure: Intravascular Ultrasound/IVUS;  Surgeon: Nigel Mormon, MD;  Location: [facility];  Service: Cardiovascular;  Laterality: N/A;  . LEFT HEART CATH AND CORONARY ANGIOGRAPHY N/A 12/10/2016   Procedure: LEFT HEART CATH AND CORONARY ANGIOGRAPHY;  Surgeon: Nigel Mormon, MD;  Location: [place] [facility];  Service: Cardiovascular;  Laterality: N/A;    SOCIAL HISTORY: Social History   Socioeconomic History  . Marital status: Single    Spouse name: Not on file  . Number of children: Not on file  . Years of education: Not on file  . Highest education level: Not on file  Occupational History  . Not on file  Social Needs  . Financial resource strain: Not on file  . Food insecurity:    Worry: Not on file    Inability: Not on file  . Transportation needs:    Medical: Not on file    Non-medical: Not on file  Tobacco Use  . Smoking status: Former Smoker    Packs/day: 1.00    Years: 20.00    Pack years: 20.00    Types: Cigarettes    Last attempt to quit: 03/23/1978    Years since quitting: 39.9  . Smokeless tobacco: Never Used  Substance and Sexual Activity  . Alcohol use: Yes    Alcohol/week: 0.0 standard drinks    Comment: wine/has not had any in 1 year  . Drug use: No  . Sexual activity: Not on file  Lifestyle  . Physical activity:    Days per week: Not on file    Minutes per session:  Not on file  . Stress: Not on file  Relationships  . Social connections:    Talks on phone: Not on file    Gets together: Not on file    Attends religious service: Not on file    Active member of club or organization: Not on file    Attends meetings of clubs or organizations: Not on file    Relationship status: Not on file  . Intimate partner violence:    Fear of current or ex partner: Not on file    Emotionally abused: Not on file    Physically abused: Not on file    Forced sexual activity: Not on file  Other Topics Concern  . Not on file  Social History Narrative  . Not on file    FAMILY HISTORY: Family History  Problem Relation Age of Onset  . Colon cancer Neg Hx   .  Esophageal cancer Neg Hx   . Rectal cancer Neg Hx   . Stomach cancer Neg Hx     ALLERGIES:  has No Known Allergies.  MEDICATIONS:  Current Outpatient Medications  Medication Sig Dispense Refill  . carvedilol (COREG) 3.125 MG tablet Take 1 tablet (3.125 mg total) by mouth 2 (two) times daily. 60 tablet 0  . JARDIANCE 25 MG TABS tablet Take 25 mg by mouth at bedtime.    . Multiple Vitamins-Minerals (CENTRUM SILVER PO) Take 1 tablet by mouth at bedtime.     . rosuvastatin (CRESTOR) 20 MG tablet Take 20 mg by mouth at bedtime.  4  . SitaGLIPtin-MetFORMIN HCl (JANUMET XR) [phone] MG TB24 Take 1 tablet by mouth every evening.     No current facility-administered medications for this visit.     REVIEW OF SYSTEMS:    A 10+ POINT REVIEW OF SYSTEMS WAS OBTAINED including neurology, dermatology, psychiatry, cardiac, respiratory, lymph, extremities, GI, GU, Musculoskeletal, constitutional, breasts, reproductive, HEENT.  All pertinent positives are noted in the HPI.  All others are negative.    PHYSICAL EXAMINATION:  . Vitals:   03/01/18 1121  BP: 128/87  Pulse: 81  Resp: 16  Temp: 97.8 F (36.6 C)  SpO2: 100%   Filed Weights   03/01/18 1121  Weight: 185 lb 3.2 oz (84 kg)   .Body mass index is 23.15  kg/m.  GENERAL:alert, in no acute distress and comfortable SKIN: no acute rashes, no significant lesions EYES: conjunctival pallor noted, no scleral icterus OROPHARYNX: MMM, no exudates, no oropharyngeal erythema or ulceration NECK: supple, no JVD LYMPH:  no palpable lymphadenopathy in the cervical, axillary or inguinal regions LUNGS: clear to auscultation b/l with normal respiratory effort HEART: regular rate & rhythm ABDOMEN:  normoactive bowel sounds , non tender, not distended. No palpable hepatosplenomegaly Extremity: no pedal edema PSYCH: alert & oriented x 3 with fluent speech NEURO: no focal motor/sensory deficits  LABORATORY DATA:  I have reviewed the data as listed  . CBC Latest Ref Rng & Units 03/01/2018 02/23/2018 02/19/2018  WBC 4.0 - 10.5 K/uL 7.1 7.8 6.2  Hemoglobin 13.0 - 17.0 g/dL 8.9(L) 8.8(L) 7.8(L)  Hematocrit 39.0 - 52.0 % 28.0(L) 27.7(L) 25.2(L)  Platelets 150 - 400 K/uL 150 128(L) 104(L)   . CBC    Component Value Date/Time   WBC 7.1 03/01/2018 1019   WBC 7.8 02/23/2018 0750   RBC 2.28 (L) 03/01/2018 1019   HGB 8.9 (L) 03/01/2018 1019   HCT 28.0 (L) 03/01/2018 1019   PLT 150 03/01/2018 1019   MCV 122.8 (H) 03/01/2018 1019   MCH 39.0 (H) 03/01/2018 1019   MCHC 31.8 03/01/2018 1019   RDW 15.2 03/01/2018 1019   LYMPHSABS 0.8 03/01/2018 1019   MONOABS 1.0 03/01/2018 1019   EOSABS 0.0 03/01/2018 1019   BASOSABS 0.0 03/01/2018 1019    . CMP Latest Ref Rng & Units 03/01/2018 02/18/2018 02/17/2018  Glucose 70 - 99 mg/dL 167(H) 121(H) 198(H)  BUN 8 - 23 mg/dL 21 21 27(H)  Creatinine 0.61 - 1.24 mg/dL 1.26(H) 1.12 1.26(H)  Sodium 135 - 145 mmol/L 140 138 136  Potassium 3.5 - 5.1 mmol/L 4.4 3.5 4.0  Chloride 98 - 111 mmol/L 106 110 105  CO2 22 - 32 mmol/L 25 22 22   Calcium 8.9 - 10.3 mg/dL 9.9 8.8(L) 9.1  Total Protein 6.5 - 8.1 g/dL 7.4 - 6.6  Total Bilirubin 0.3 - 1.2 mg/dL 1.0 - 1.1  Alkaline Phos 38 - 126 U/L  65 - 49  AST 15 - 41 U/L 15 - 20    ALT 0 - 44 U/L 14 - 14   02/23/18 BM Bx:   02/23/18 BM Flow Cytometry:    RADIOGRAPHIC STUDIES: I have personally reviewed the radiological images as listed and agreed with the findings in the report. Dg Chest 2 View  Result Date: 02/17/2018 CLINICAL DATA:  Syncope EXAM: CHEST - 2 VIEW COMPARISON:  06/08/2013 chest radiograph. FINDINGS: Stable cardiomediastinal silhouette with normal heart size. No pneumothorax. No pleural effusion. No pulmonary edema. Mild reticular opacities at the right greater than left lung bases, slightly increased. No acute consolidative airspace disease. IMPRESSION: Mild bibasilar reticular opacities, right greater than left, slightly increased, suggestive of nonspecific mild fibrosis, cannot exclude interstitial lung disease. No acute cardiopulmonary disease. Electronically Signed   By: Ilona Sorrel M.D.   On: 02/17/2018 15:04   Ct Biopsy  Result Date: 02/23/2018 INDICATION: Anemia, thrombocytopenia EXAM: CT GUIDED RIGHT ILIAC BONE MARROW ASPIRATION AND CORE BIOPSY Date:  02/23/2018 02/23/2018 9:45 am Radiologist:  M. Daryll Brod, MD Guidance:  CT FLUOROSCOPY TIME:  Fluoroscopy Time: NONE. MEDICATIONS: 1% lidocaine local ANESTHESIA/SEDATION: 0 mg IV Versed; 50 mcg IV Fentanyl Moderate Sedation Time:  None. The patient was continuously monitored during the procedure by the interventional radiology nurse under my direct supervision. CONTRAST:  None COMPLICATIONS: None PROCEDURE: Informed consent was obtained from the patient following explanation of the procedure, risks, benefits and alternatives. The patient understands, agrees and consents for the procedure. All questions were addressed. A time out was performed. The patient was positioned prone and non-contrast localization CT was performed of the pelvis to demonstrate the iliac marrow spaces. Maximal barrier sterile technique utilized including caps, mask, sterile gowns, sterile gloves, large sterile drape, hand hygiene,  and Betadine prep. Under sterile conditions and local anesthesia, an 11 gauge coaxial bone biopsy needle was advanced into the right iliac marrow space. Needle position was confirmed with CT imaging. Initially, bone marrow aspiration was performed. Next, the 11 gauge outer cannula was utilized to obtain a right iliac bone marrow core biopsy. Needle was removed. Hemostasis was obtained with compression. The patient tolerated the procedure well. Samples were prepared with the cytotechnologist. No immediate complications. IMPRESSION: CT guided right iliac bone marrow aspiration and core biopsy. Electronically Signed   By: Jerilynn Mages.  Shick M.D.   On: 02/23/2018 10:45   Ct Bone Marrow Biopsy & Aspiration  Result Date: 02/23/2018 INDICATION: Anemia, thrombocytopenia EXAM: CT GUIDED RIGHT ILIAC BONE MARROW ASPIRATION AND CORE BIOPSY Date:  02/23/2018 02/23/2018 9:45 am Radiologist:  M. Daryll Brod, MD Guidance:  CT FLUOROSCOPY TIME:  Fluoroscopy Time: NONE. MEDICATIONS: 1% lidocaine local ANESTHESIA/SEDATION: 0 mg IV Versed; 50 mcg IV Fentanyl Moderate Sedation Time:  None. The patient was continuously monitored during the procedure by the interventional radiology nurse under my direct supervision. CONTRAST:  None COMPLICATIONS: None PROCEDURE: Informed consent was obtained from the patient following explanation of the procedure, risks, benefits and alternatives. The patient understands, agrees and consents for the procedure. All questions were addressed. A time out was performed. The patient was positioned prone and non-contrast localization CT was performed of the pelvis to demonstrate the iliac marrow spaces. Maximal barrier sterile technique utilized including caps, mask, sterile gowns, sterile gloves, large sterile drape, hand hygiene, and Betadine prep. Under sterile conditions and local anesthesia, an 11 gauge coaxial bone biopsy needle was advanced into the right iliac marrow space. Needle position was confirmed with  CT imaging.  Initially, bone marrow aspiration was performed. Next, the 11 gauge outer cannula was utilized to obtain a right iliac bone marrow core biopsy. Needle was removed. Hemostasis was obtained with compression. The patient tolerated the procedure well. Samples were prepared with the cytotechnologist. No immediate complications. IMPRESSION: CT guided right iliac bone marrow aspiration and core biopsy. Electronically Signed   By: Jerilynn Mages.  Shick M.D.   On: 02/23/2018 10:45    ASSESSMENT & PLAN:  77 y.o. 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 No overt clinical evidence of acute bleeding.  Fecal occult blood test x1-.  Clinically no reported bleeding. Ferritin is in normal limits. B12 and folate within normal limits.  Normal RDW suggests against overt nutritional deficiency. TSH within normal limits Peripheral blood smear evidence of anisopoikilocytosis. Hgb has improved from 7.8 to 8.9 without transfusion.  #2 Mild thrombocytopenia platelets in the 100-120k range with no overt evidence of bleeding. Now resolved.  PLAN:  -Discussed pt labwork today, 03/01/18; WBC normal at 7.1k, Hgb slightly increased to 8.9, MCV at 122.8, PLT normalized to 150k -Discussed that as counts have improved, there may have been also been a temporary suppressive element such as a medication effect or viral infection, that overlaid the patient's newly diagnosed pathology  -No current indication for a blood transfusion  -Discussed the 02/23/18 BM Bx which revealed hypercellular bone marrow with dyspoietic changes -02/23/18 BM Flow Cytometry did not reveal a significant CD34 positive blastic population -Discussed that there is a proliferative component indicating MDS vs MDS with a myeloproliferative neoplasm -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 -Genetics  are pending for further characterization and diagnosis -Discussed that the only cure for the possible conditions in the differential, are bone marrow transplants, which the pt would like to avoid at this time  -Discussed that at this point, we will wait for the genetics to finalize treatment options vs watchful observation -Will see the pt back in 4-6 weeks, sooner if any new concerns of light headedness or fatigue for consideration of transfusion  -Unclear chronicity of blood count abnormalities the patient is relatively asymptomatic at this time with regards to his anemia at rest.  RTC with Dr Irene Limbo in 5 weeks with labs   All of the patients questions were answered with apparent satisfaction. The patient knows to call the clinic with any problems, questions or concerns.  The total time spent in the appt was 25 minutes and more than 50% was on counseling and direct patient cares.     Sullivan Lone MD MS AAHIVMS [facility] Hematology/Oncology Physician [facility]  (Office):       [phone] (Work cell):  [phone] (Fax):           [phone]  03/01/2018 11:48 AM  I, Baldwin Jamaica, am acting as a scribe for Dr. Sullivan Lone.   .I have reviewed the above documentation for accuracy and completeness, and I agree with the above. Brunetta Genera MD

## 2018-03-09 ENCOUNTER — Encounter (HOSPITAL_COMMUNITY): Payer: Self-pay | Admitting: Hematology

## 2018-04-04 NOTE — Progress Notes (Signed)
HEMATOLOGY/ONCOLOGY CONSULTATION NOTE  Date of Service: 04/05/2018  Patient Care Team: Burnard Bunting, MD as PCP - General (Internal Medicine)  CHIEF COMPLAINTS/PURPOSE OF CONSULTATION:  Macrocytic Anemia Thrombocytopenia   HISTORY OF PRESENTING ILLNESS:  Steve Maldonado is a wonderful 78 y.o. male who has been referred to [place] by Dr Cyndia Skeeters, Charlesetta Ivory, MD  for evaluation and management of macrocytic anemia and thrombocytopenia.  Patient is an overall healthy 78 year old male, retired [employer], with a history of hypertension, dyslipidemia, CVA with resolved right-sided weakness at age 74 of unclear etiology.  Patient was that he has been feeling well overall and was recently seen by Dr. Reynaldo Minium his primary care physician about 7 to 10 days prior to admission and is not aware of any abnormal labs at the time.  He does not recollect the exact blood counts at the time.  He does note that he had labs about 7 to 10 days prior to admission as well as 6 months prior to admission and there was no reported concerns on those.  Patient notes that he was cooking Thanksgiving lunch and developed lightheadedness and dizziness with presyncopal symptomatology.  He had to sit down on recliner and the dizziness still persisted for about 3 minutes and was associated with mild chest tightness resulting in a call to EMS who reportedly noted a blood pressure of 70 systolic. He reports that he had a had a good breakfast and was drinking enough water. He notes that prior to this episode he was feeling well and had no real new concerns or other symptoms.  In the emergency room his EKG showed sinus bradycardia with a heart rate of 58 bpm.  His blood pressure improved with fluid resuscitation.  Labs on admission -CBC showed a hemoglobin of 8.2 with an MCV of 122, RDW of 15, platelets of 114k, WBC counts within normal limits at 6.7k with normal differential.  He was noted to have some mild monocytosis of  1.2k. 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  B12 within normal limits at 673, folate of 41.8, TSH within normal limits at 2.12. Patient denies any history of liver disease. Patient denies significant alcohol use. No other new focal symptomatology.  Absolute reticulocyte count of 61k with increase in immature reticulocyte fraction. Denies any overt bleeding.  Normal bowel movements no hematochezia or melena. LDH was within normal limits at 175 and bilirubin was within normal limits and suggest against overt hemolysis.  Repeat labs today show hemoglobin is at 7.8 with platelet counts of 104k Patient notes no acute symptomatology and no overt dizziness while in bed. No bone pains.  No abnormal bruising. No fevers no chills no night sweats no abnormal weight loss. No other revealing focal symptomatology.  Interval History:   Steve Maldonado returns today for management and evaluation of his macrocytic anemia and thrombocytopenia. The patient's last visit with [place] was on 03/01/18. The pt reports that he is doing well overall.   The pt reports that he was not able to get his Coreg refilled, and hasn't taken this for the past 3 weeks. The pt has not seen his PCP since our last visit.  The pt denies any dizzy spells in the interim and denies passing out episodes. He has continued to have stable energy levels, has had stable weight, and has continued eating well. He denies developing any new concerns.   Lab results today (04/05/18) of CBC w/diff and CMP is as  follows: all values are WNL except for RBC at 2.37, HGB at 9.2, HCT at 29.0, MCV at 122.4, MCH at 38.8, PLT at 132k, Monocytes abs at 1.1k, Glucose at 82, BUN at 24, AST at 12, GFR at 56.  On review of systems, pt reports stable energy levels, eating well, stable weight, and denies dizziness, passing out, fevers, chills, nose bleeds, abnormal bleeding, abnormal bruising, abdominal pains, and  any other symptoms.    MEDICAL HISTORY:  Past Medical History:  Diagnosis Date  . Hyperlipidemia   . Hypertension   . Stroke [facility]) 1970    SURGICAL HISTORY: Past Surgical History:  Procedure Laterality Date  . APPENDECTOMY  1980  . INTRAVASCULAR ULTRASOUND/IVUS N/A 12/10/2016   Procedure: Intravascular Ultrasound/IVUS;  Surgeon: Nigel Mormon, MD;  Location: [facility];  Service: Cardiovascular;  Laterality: N/A;  . LEFT HEART CATH AND CORONARY ANGIOGRAPHY N/A 12/10/2016   Procedure: LEFT HEART CATH AND CORONARY ANGIOGRAPHY;  Surgeon: Nigel Mormon, MD;  Location: [facility];  Service: Cardiovascular;  Laterality: N/A;    SOCIAL HISTORY: Social History   Socioeconomic History  . Marital status: Single    Spouse name: Not on file  . Number of children: Not on file  . Years of education: Not on file  . Highest education level: Not on file  Occupational History  . Not on file  Social Needs  . Financial resource strain: Not on file  . Food insecurity:    Worry: Not on file    Inability: Not on file  . Transportation needs:    Medical: Not on file    Non-medical: Not on file  Tobacco Use  . Smoking status: Former Smoker    Packs/day: 1.00    Years: 20.00    Pack years: 20.00    Types: Cigarettes    Last attempt to quit: 03/23/1978    Years since quitting: 40.0  . Smokeless tobacco: Never Used  Substance and Sexual Activity  . Alcohol use: Yes    Alcohol/week: 0.0 standard drinks    Comment: wine/has not had any in 1 year  . Drug use: No  . Sexual activity: Not on file  Lifestyle  . Physical activity:    Days per week: Not on file    Minutes per session: Not on file  . Stress: Not on file  Relationships  . Social connections:    Talks on phone: Not on file    Gets together: Not on file    Attends religious service: Not on file    Active member of club or organization: Not on file    Attends meetings of clubs or organizations: Not  on file    Relationship status: Not on file  . Intimate partner violence:    Fear of current or ex partner: Not on file    Emotionally abused: Not on file    Physically abused: Not on file    Forced sexual activity: Not on file  Other Topics Concern  . Not on file  Social History Narrative  . Not on file    FAMILY HISTORY: Family History  Problem Relation Age of Onset  . Colon cancer Neg Hx   . Esophageal cancer Neg Hx   . Rectal cancer Neg Hx   . Stomach cancer Neg Hx     ALLERGIES:  has No Known Allergies.  MEDICATIONS:  Current Outpatient Medications  Medication Sig Dispense Refill  . JARDIANCE 25 MG TABS tablet  Take 25 mg by mouth at bedtime.    . Multiple Vitamins-Minerals (CENTRUM SILVER PO) Take 1 tablet by mouth at bedtime.     . rosuvastatin (CRESTOR) 20 MG tablet Take 20 mg by mouth at bedtime.  4  . SitaGLIPtin-MetFORMIN HCl (JANUMET XR) [phone] MG TB24 Take 1 tablet by mouth every evening.    . carvedilol (COREG) 3.125 MG tablet Take 1 tablet (3.125 mg total) by mouth 2 (two) times daily. 60 tablet 0   No current facility-administered medications for this visit.     REVIEW OF SYSTEMS:    A 10+ POINT REVIEW OF SYSTEMS WAS OBTAINED including neurology, dermatology, psychiatry, cardiac, respiratory, lymph, extremities, GI, GU, Musculoskeletal, constitutional, breasts, reproductive, HEENT.  All pertinent positives are noted in the HPI.  All others are negative.   PHYSICAL EXAMINATION:  . Vitals:   04/05/18 1456  BP: (!) 111/57  Pulse: 68  Resp: 18  Temp: 98 F (36.7 C)  SpO2: 100%   Filed Weights   04/05/18 1456  Weight: 188 lb (85.3 kg)   .Body mass index is 23.5 kg/m.  GENERAL:alert, in no acute distress and comfortable SKIN: no acute rashes, no significant lesions EYES: conjunctiva are pink and non-injected, sclera anicteric OROPHARYNX: MMM, no exudates, no oropharyngeal erythema or ulceration NECK: supple, no JVD LYMPH:  no palpable  lymphadenopathy in the cervical, axillary or inguinal regions LUNGS: clear to auscultation b/l with normal respiratory effort HEART: regular rate & rhythm ABDOMEN:  normoactive bowel sounds , non tender, not distended. No palpable hepatosplenomegaly.  Extremity: no pedal edema PSYCH: alert & oriented x 3 with fluent speech NEURO: no focal motor/sensory deficits   LABORATORY DATA:  I have reviewed the data as listed  . CBC Latest Ref Rng & Units 04/05/2018 03/01/2018 02/23/2018  WBC 4.0 - 10.5 K/uL 7.2 7.1 7.8  Hemoglobin 13.0 - 17.0 g/dL 9.2(L) 8.9(L) 8.8(L)  Hematocrit 39.0 - 52.0 % 29.0(L) 28.0(L) 27.7(L)  Platelets 150 - 400 K/uL 132(L) 150 128(L)   . CBC    Component Value Date/Time   WBC 7.2 04/05/2018 1415   RBC 2.37 (L) 04/05/2018 1415   HGB 9.2 (L) 04/05/2018 1415   HGB 8.9 (L) 03/01/2018 1019   HCT 29.0 (L) 04/05/2018 1415   PLT 132 (L) 04/05/2018 1415   PLT 150 03/01/2018 1019   MCV 122.4 (H) 04/05/2018 1415   MCH 38.8 (H) 04/05/2018 1415   MCHC 31.7 04/05/2018 1415   RDW 15.2 04/05/2018 1415   LYMPHSABS 0.9 04/05/2018 1415   MONOABS 1.1 (H) 04/05/2018 1415   EOSABS 0.1 04/05/2018 1415   BASOSABS 0.0 04/05/2018 1415    . CMP Latest Ref Rng & Units 04/05/2018 03/01/2018 02/18/2018  Glucose 70 - 99 mg/dL 182(H) 167(H) 121(H)  BUN 8 - 23 mg/dL 24(H) 21 21  Creatinine 0.61 - 1.24 mg/dL 1.23 1.26(H) 1.12  Sodium 135 - 145 mmol/L 142 140 138  Potassium 3.5 - 5.1 mmol/L 4.3 4.4 3.5  Chloride 98 - 111 mmol/L 108 106 110  CO2 22 - 32 mmol/L 26 25 22   Calcium 8.9 - 10.3 mg/dL 9.7 9.9 8.8(L)  Total Protein 6.5 - 8.1 g/dL 7.2 7.4 -  Total Bilirubin 0.3 - 1.2 mg/dL 0.7 1.0 -  Alkaline Phos 38 - 126 U/L 63 65 -  AST 15 - 41 U/L 12(L) 15 -  ALT 0 - 44 U/L 15 14 -   02/23/18 BM Bx:   02/23/18 BM Flow Cytometry:   02/23/18 Cytogenetics:  RADIOGRAPHIC STUDIES: I have personally reviewed the radiological images as listed and agreed with the findings in the  report. No results found.  ASSESSMENT & PLAN:  78 y.o. 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 No overt clinical evidence of acute bleeding.  Fecal occult blood test x1-.  Clinically no reported bleeding. Ferritin is in normal limits. B12 and folate within normal limits.  Normal RDW suggests against overt nutritional deficiency. TSH within normal limits Peripheral blood smear evidence of anisopoikilocytosis. Hgb has improved from 7.8 to 8.9 without transfusion.  02/23/18 BM Bx which revealed hypercellular bone marrow with dyspoietic changes 02/23/18 BM Flow Cytometry did not reveal a significant CD34 positive blastic population 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   #2 Mild thrombocytopenia platelets in the 100-120k range with no overt evidence of bleeding. Now resolved.  PLAN:  -Discussed pt labwork today, 04/05/18; HGB stable at 9.2, PLT stable at 132k, WBC stable at 7.2k -Discussed the 02/23/18 Cytogenetics revealed a nonspecific translocation -Overall picture consistent with CMML, type 1 -Recommend pt call his PCP today to get Coreg refilled- pt has not had this for the past 3 weeks  -Will continue watchful observation, with supportive therapies as needed, at this time -Will consider targeted therapies if necessary  -Pt will monitor for new fatigue, abnormal bruising, and abnormal bleeding and will let me know if he develops any of these  -No current indication for a blood transfusion  -Discussed that the only cure for the possible conditions in the differential, are bone marrow transplants, which the pt would no like to consider -Unclear chronicity of blood count abnormalities the patient is relatively asymptomatic at this time with regards to his anemia at rest. -Will see the pt back in 2 months    RTC with Dr  Irene Limbo in 2 months with labs    All of the patients questions were answered with apparent satisfaction. The patient knows to call the clinic with any problems, questions or concerns.  The total time spent in the appt was 20 minutes and more than 50% was on counseling and direct patient cares.    Sullivan Lone MD MS AAHIVMS [facility] Hematology/Oncology Physician [facility]  (Office):       [phone] (Work cell):  [phone] (Fax):           [phone]  04/05/2018 4:01 PM  I, Baldwin Jamaica, am acting as a scribe for Dr. Sullivan Lone.   .I have reviewed the above documentation for accuracy and completeness, and I agree with the above. Brunetta Genera MD

## 2018-04-05 ENCOUNTER — Inpatient Hospital Stay: Payer: Medicare Other | Attending: Hematology | Admitting: Hematology

## 2018-04-05 ENCOUNTER — Inpatient Hospital Stay: Payer: Medicare Other

## 2018-04-05 VITALS — BP 111/57 | HR 68 | Temp 98.0°F | Resp 18 | Ht 75.0 in | Wt 188.0 lb

## 2018-04-05 DIAGNOSIS — Z79899 Other long term (current) drug therapy: Secondary | ICD-10-CM

## 2018-04-05 DIAGNOSIS — D649 Anemia, unspecified: Secondary | ICD-10-CM

## 2018-04-05 DIAGNOSIS — I10 Essential (primary) hypertension: Secondary | ICD-10-CM | POA: Diagnosis not present

## 2018-04-05 DIAGNOSIS — D539 Nutritional anemia, unspecified: Secondary | ICD-10-CM

## 2018-04-05 DIAGNOSIS — D469 Myelodysplastic syndrome, unspecified: Secondary | ICD-10-CM

## 2018-04-05 DIAGNOSIS — D696 Thrombocytopenia, unspecified: Secondary | ICD-10-CM | POA: Diagnosis not present

## 2018-04-05 DIAGNOSIS — Z87891 Personal history of nicotine dependence: Secondary | ICD-10-CM | POA: Diagnosis not present

## 2018-04-05 LAB — SAMPLE TO BLOOD BANK

## 2018-04-05 LAB — CBC WITH DIFFERENTIAL/PLATELET
Abs Immature Granulocytes: 0.03 10*3/uL (ref 0.00–0.07)
Basophils Absolute: 0 10*3/uL (ref 0.0–0.1)
Basophils Relative: 0 %
Eosinophils Absolute: 0.1 10*3/uL (ref 0.0–0.5)
Eosinophils Relative: 1 %
HCT: 29 % — ABNORMAL LOW (ref 39.0–52.0)
Hemoglobin: 9.2 g/dL — ABNORMAL LOW (ref 13.0–17.0)
Immature Granulocytes: 0 %
Lymphocytes Relative: 12 %
Lymphs Abs: 0.9 10*3/uL (ref 0.7–4.0)
MCH: 38.8 pg — ABNORMAL HIGH (ref 26.0–34.0)
MCHC: 31.7 g/dL (ref 30.0–36.0)
MCV: 122.4 fL — ABNORMAL HIGH (ref 80.0–100.0)
Monocytes Absolute: 1.1 10*3/uL — ABNORMAL HIGH (ref 0.1–1.0)
Monocytes Relative: 15 %
NEUTROS PCT: 72 %
Neutro Abs: 5.2 10*3/uL (ref 1.7–7.7)
Platelets: 132 10*3/uL — ABNORMAL LOW (ref 150–400)
RBC: 2.37 MIL/uL — ABNORMAL LOW (ref 4.22–5.81)
RDW: 15.2 % (ref 11.5–15.5)
WBC: 7.2 10*3/uL (ref 4.0–10.5)
nRBC: 0 % (ref 0.0–0.2)

## 2018-04-05 LAB — CMP (CANCER CENTER ONLY)
ALT: 15 U/L (ref 0–44)
AST: 12 U/L — ABNORMAL LOW (ref 15–41)
Albumin: 4.3 g/dL (ref 3.5–5.0)
Alkaline Phosphatase: 63 U/L (ref 38–126)
Anion gap: 8 (ref 5–15)
BUN: 24 mg/dL — ABNORMAL HIGH (ref 8–23)
CO2: 26 mmol/L (ref 22–32)
Calcium: 9.7 mg/dL (ref 8.9–10.3)
Chloride: 108 mmol/L (ref 98–111)
Creatinine: 1.23 mg/dL (ref 0.61–1.24)
GFR, Est AFR Am: >60 mL/min (ref >60)
GFR, Estimated: 56 mL/min — ABNORMAL LOW (ref >60)
Glucose, Bld: 182 mg/dL — ABNORMAL HIGH (ref 70–99)
POTASSIUM: 4.3 mmol/L (ref 3.5–5.1)
SODIUM: 142 mmol/L (ref 135–145)
Total Bilirubin: 0.7 mg/dL (ref 0.3–1.2)
Total Protein: 7.2 g/dL (ref 6.5–8.1)

## 2018-04-05 LAB — LACTATE DEHYDROGENASE: LDH: 181 U/L (ref 98–192)

## 2018-04-05 NOTE — Patient Instructions (Signed)
Thank you for choosing [facility] to provide your oncology and hematology care.  To afford each patient quality time with our providers, please arrive 30 minutes before your scheduled appointment time.  If you arrive late for your appointment, you may be asked to reschedule.  We strive to give you quality time with our providers, and arriving late affects you and other patients whose appointments are after yours.    If you are a no show for multiple scheduled visits, you may be dismissed from the clinic at the providers discretion.     Again, thank you for choosing [facility], our hope is that these requests will decrease the amount of time that you wait before being seen by our physicians.  ______________________________________________________________________   Should you have questions after your visit to the [facility], please contact our office at [phone] between the hours of 8:30 and 4:30 p.m.    Voicemails left after 4:30p.m will not be returned until the following business day.     For prescription refill requests, please have your pharmacy contact us directly.  Please also try to allow 48 hours for prescription requests.     Please contact the scheduling department for questions regarding scheduling.  For scheduling of procedures such as PET scans, CT scans, MRI, Ultrasound, etc please contact central scheduling at [phone].     Resources For Cancer Patients and Caregivers:    Oncolink.org:  A wonderful resource for patients and healthcare providers for information regarding your disease, ways to tract your treatment, what to expect, etc.      American Cancer Society:  800-227-2345  Can help patients locate various types of support and financial assistance   Cancer Care: 1-800-813-HOPE (4673) Provides financial assistance, online support groups, medication/co-pay assistance.     [place] DSS:  336-641-3447 Where to apply  for food stamps, Medicaid, and utility assistance   Medicare Rights Center: 800-333-4114 Helps people with Medicare understand their rights and benefits, navigate the Medicare system, and secure the quality healthcare they deserve   SCAT: 336-333-6589 [place] [employer]'s shared-ride transportation service for eligible riders who have a disability that prevents them from riding the fixed route bus.     For additional information on assistance programs please contact our social worker:   Abigail Elmore:  [phone]  

## 2018-04-06 ENCOUNTER — Telehealth: Payer: Self-pay

## 2018-04-06 NOTE — Telephone Encounter (Signed)
Spoke with patient concerning his upcoming appointment./ per 1/14 los Mailed a letter with calender enclosed

## 2018-06-01 ENCOUNTER — Telehealth: Payer: Self-pay | Admitting: Hematology

## 2018-06-01 ENCOUNTER — Inpatient Hospital Stay: Payer: Medicare Other

## 2018-06-01 ENCOUNTER — Inpatient Hospital Stay: Payer: Medicare Other | Attending: Hematology | Admitting: Hematology

## 2018-06-01 ENCOUNTER — Other Ambulatory Visit: Payer: Self-pay

## 2018-06-01 VITALS — BP 123/68 | HR 76 | Temp 98.2°F | Resp 18 | Ht 75.0 in | Wt 186.2 lb

## 2018-06-01 DIAGNOSIS — D539 Nutritional anemia, unspecified: Secondary | ICD-10-CM

## 2018-06-01 DIAGNOSIS — D696 Thrombocytopenia, unspecified: Secondary | ICD-10-CM

## 2018-06-01 DIAGNOSIS — D469 Myelodysplastic syndrome, unspecified: Secondary | ICD-10-CM

## 2018-06-01 DIAGNOSIS — I10 Essential (primary) hypertension: Secondary | ICD-10-CM | POA: Insufficient documentation

## 2018-06-01 DIAGNOSIS — Z79899 Other long term (current) drug therapy: Secondary | ICD-10-CM | POA: Diagnosis not present

## 2018-06-01 DIAGNOSIS — D649 Anemia, unspecified: Secondary | ICD-10-CM | POA: Diagnosis not present

## 2018-06-01 DIAGNOSIS — Z87891 Personal history of nicotine dependence: Secondary | ICD-10-CM | POA: Insufficient documentation

## 2018-06-01 LAB — CBC WITH DIFFERENTIAL/PLATELET
Abs Immature Granulocytes: 0.02 10*3/uL (ref 0.00–0.07)
Basophils Absolute: 0 10*3/uL (ref 0.0–0.1)
Basophils Relative: 0 %
Eosinophils Absolute: 0 10*3/uL (ref 0.0–0.5)
Eosinophils Relative: 1 %
HCT: 27.9 % — ABNORMAL LOW (ref 39.0–52.0)
Hemoglobin: 8.8 g/dL — ABNORMAL LOW (ref 13.0–17.0)
Immature Granulocytes: 0 %
Lymphocytes Relative: 13 %
Lymphs Abs: 0.8 10*3/uL (ref 0.7–4.0)
MCH: 37.8 pg — ABNORMAL HIGH (ref 26.0–34.0)
MCHC: 31.5 g/dL (ref 30.0–36.0)
MCV: 119.7 fL — ABNORMAL HIGH (ref 80.0–100.0)
Monocytes Absolute: 0.9 10*3/uL (ref 0.1–1.0)
Monocytes Relative: 15 %
Neutro Abs: 4.3 10*3/uL (ref 1.7–7.7)
Neutrophils Relative %: 71 %
Platelets: 125 10*3/uL — ABNORMAL LOW (ref 150–400)
RBC: 2.33 MIL/uL — AB (ref 4.22–5.81)
RDW: 16.2 % — ABNORMAL HIGH (ref 11.5–15.5)
WBC: 6 10*3/uL (ref 4.0–10.5)
nRBC: 0.5 % — ABNORMAL HIGH (ref 0.0–0.2)

## 2018-06-01 LAB — CMP (CANCER CENTER ONLY)
ALT: 13 U/L (ref 0–44)
AST: 14 U/L — ABNORMAL LOW (ref 15–41)
Albumin: 4.2 g/dL (ref 3.5–5.0)
Alkaline Phosphatase: 74 U/L (ref 38–126)
Anion gap: 10 (ref 5–15)
BUN: 28 mg/dL — ABNORMAL HIGH (ref 8–23)
CHLORIDE: 107 mmol/L (ref 98–111)
CO2: 26 mmol/L (ref 22–32)
Calcium: 9.6 mg/dL (ref 8.9–10.3)
Creatinine: 1.29 mg/dL — ABNORMAL HIGH (ref 0.61–1.24)
GFR, Est AFR Am: >60 mL/min (ref >60)
GFR, Estimated: 53 mL/min — ABNORMAL LOW (ref >60)
Glucose, Bld: 200 mg/dL — ABNORMAL HIGH (ref 70–99)
Potassium: 4.5 mmol/L (ref 3.5–5.1)
Sodium: 143 mmol/L (ref 135–145)
TOTAL PROTEIN: 7.1 g/dL (ref 6.5–8.1)
Total Bilirubin: 0.8 mg/dL (ref 0.3–1.2)

## 2018-06-01 LAB — SAMPLE TO BLOOD BANK

## 2018-06-01 LAB — LACTATE DEHYDROGENASE: LDH: 205 U/L — ABNORMAL HIGH (ref 98–192)

## 2018-06-01 NOTE — Telephone Encounter (Signed)
Called and scheduled appt per 3/11 los.  Patient aware of appt date and time.

## 2018-06-01 NOTE — Progress Notes (Signed)
HEMATOLOGY/ONCOLOGY CLINIC NOTE  Date of Service: 06/01/2018  Patient Care Team: Burnard Bunting, MD as PCP - General (Internal Medicine)  CHIEF COMPLAINTS/PURPOSE OF CONSULTATION:  Macrocytic Anemia Thrombocytopenia   HISTORY OF PRESENTING ILLNESS:  Steve Maldonado is a wonderful 78 y.o. male who has been referred to [place] by Dr Cyndia Skeeters, Charlesetta Ivory, MD  for evaluation and management of macrocytic anemia and thrombocytopenia.  Patient is an overall healthy 78 year old male, retired [employer], with a history of hypertension, dyslipidemia, CVA with resolved right-sided weakness at age 61 of unclear etiology.  Patient was that he has been feeling well overall and was recently seen by Dr. Reynaldo Minium his primary care physician about 7 to 10 days prior to admission and is not aware of any abnormal labs at the time.  He does not recollect the exact blood counts at the time.  He does note that he had labs about 7 to 10 days prior to admission as well as 6 months prior to admission and there was no reported concerns on those.  Patient notes that he was cooking Thanksgiving lunch and developed lightheadedness and dizziness with presyncopal symptomatology.  He had to sit down on recliner and the dizziness still persisted for about 3 minutes and was associated with mild chest tightness resulting in a call to EMS who reportedly noted a blood pressure of 70 systolic. He reports that he had a had a good breakfast and was drinking enough water. He notes that prior to this episode he was feeling well and had no real new concerns or other symptoms.  In the emergency room his EKG showed sinus bradycardia with a heart rate of 58 bpm.  His blood pressure improved with fluid resuscitation.  Labs on admission -CBC showed a hemoglobin of 8.2 with an MCV of 122, RDW of 15, platelets of 114k, WBC counts within normal limits at 6.7k with normal differential.  He was noted to have some mild monocytosis of  1.2k. 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  B12 within normal limits at 673, folate of 41.8, TSH within normal limits at 2.12. Patient denies any history of liver disease. Patient denies significant alcohol use. No other new focal symptomatology.  Absolute reticulocyte count of 61k with increase in immature reticulocyte fraction. Denies any overt bleeding.  Normal bowel movements no hematochezia or melena. LDH was within normal limits at 175 and bilirubin was within normal limits and suggest against overt hemolysis.  Repeat labs today show hemoglobin is at 7.8 with platelet counts of 104k Patient notes no acute symptomatology and no overt dizziness while in bed. No bone pains.  No abnormal bruising. No fevers no chills no night sweats no abnormal weight loss. No other revealing focal symptomatology.  Interval History:   Steve Maldonado returns today for management and evaluation of his macrocytic anemia and thrombocytopenia. The patient's last visit with [place] was on 04/05/18. The pt reports that he is doing well overall.   The pt reports that he had a cold for 2 weeks in the interim, did not require antibiotics, resolved on its own, and is feeling back to his baseline, and better than our last appointment. He denies fatigue, light headedness, dizziness, cough, concern for infection. He notes that he has been keeping fairly active and is eating well.  Lab results today (06/01/18) of CBC w/diff and CMP is as follows: all values are WNL except for RBC at 2.33, HGB at 8.8, HCT  at 27.9, MCV at 119.7, MCH at 37.8, RDW at 16.2, PLT at 125k, nRBC at 0.5%, Glucose at 200, BUN at 28, Creatinine at 1.29, AST at 14, GFR at 53. 06/01/18 LDH at 205  On review of systems, pt reports good energy levels, recent infection, staying active, eating well,  and denies present concern for infection, cough, fevers, light headedness, fatigue, abdominal pains,  dizziness, leg swelling, and any other symptsom.   MEDICAL HISTORY:  Past Medical History:  Diagnosis Date  . Hyperlipidemia   . Hypertension   . Stroke [facility]) 1970    SURGICAL HISTORY: Past Surgical History:  Procedure Laterality Date  . APPENDECTOMY  1980  . INTRAVASCULAR ULTRASOUND/IVUS N/A 12/10/2016   Procedure: Intravascular Ultrasound/IVUS;  Surgeon: Nigel Mormon, MD;  Location: [place] [facility];  Service: Cardiovascular;  Laterality: N/A;  . LEFT HEART CATH AND CORONARY ANGIOGRAPHY N/A 12/10/2016   Procedure: LEFT HEART CATH AND CORONARY ANGIOGRAPHY;  Surgeon: Nigel Mormon, MD;  Location: [place] [facility];  Service: Cardiovascular;  Laterality: N/A;    SOCIAL HISTORY: Social History   Socioeconomic History  . Marital status: Single    Spouse name: Not on file  . Number of children: Not on file  . Years of education: Not on file  . Highest education level: Not on file  Occupational History  . Not on file  Social Needs  . Financial resource strain: Not on file  . Food insecurity:    Worry: Not on file    Inability: Not on file  . Transportation needs:    Medical: Not on file    Non-medical: Not on file  Tobacco Use  . Smoking status: Former Smoker    Packs/day: 1.00    Years: 20.00    Pack years: 20.00    Types: Cigarettes    Last attempt to quit: 03/23/1978    Years since quitting: 40.2  . Smokeless tobacco: Never Used  Substance and Sexual Activity  . Alcohol use: Yes    Alcohol/week: 0.0 standard drinks    Comment: wine/has not had any in 1 year  . Drug use: No  . Sexual activity: Not on file  Lifestyle  . Physical activity:    Days per week: Not on file    Minutes per session: Not on file  . Stress: Not on file  Relationships  . Social connections:    Talks on phone: Not on file    Gets together: Not on file    Attends religious service: Not on file    Active member of club or organization: Not on file    Attends meetings of  clubs or organizations: Not on file    Relationship status: Not on file  . Intimate partner violence:    Fear of current or ex partner: Not on file    Emotionally abused: Not on file    Physically abused: Not on file    Forced sexual activity: Not on file  Other Topics Concern  . Not on file  Social History Narrative  . Not on file    FAMILY HISTORY: Family History  Problem Relation Age of Onset  . Colon cancer Neg Hx   . Esophageal cancer Neg Hx   . Rectal cancer Neg Hx   . Stomach cancer Neg Hx     ALLERGIES:  has No Known Allergies.  MEDICATIONS:  Current Outpatient Medications  Medication Sig Dispense Refill  . carvedilol (COREG) 3.125 MG tablet Take 1  tablet (3.125 mg total) by mouth 2 (two) times daily. 60 tablet 0  . JARDIANCE 25 MG TABS tablet Take 25 mg by mouth at bedtime.    . Multiple Vitamins-Minerals (CENTRUM SILVER PO) Take 1 tablet by mouth at bedtime.     . rosuvastatin (CRESTOR) 20 MG tablet Take 20 mg by mouth at bedtime.  4  . SitaGLIPtin-MetFORMIN HCl (JANUMET XR) [phone] MG TB24 Take 1 tablet by mouth every evening.     No current facility-administered medications for this visit.     REVIEW OF SYSTEMS:    A 10+ POINT REVIEW OF SYSTEMS WAS OBTAINED including neurology, dermatology, psychiatry, cardiac, respiratory, lymph, extremities, GI, GU, Musculoskeletal, constitutional, breasts, reproductive, HEENT.  All pertinent positives are noted in the HPI.  All others are negative.   PHYSICAL EXAMINATION:  . Vitals:   06/01/18 1501  BP: 123/68  Pulse: 76  Resp: 18  Temp: 98.2 F (36.8 C)  SpO2: 99%   Filed Weights   06/01/18 1501  Weight: 186 lb 3.2 oz (84.5 kg)   .Body mass index is 23.27 kg/m.  GENERAL:alert, in no acute distress and comfortable SKIN: no acute rashes, no significant lesions EYES: conjunctiva are pink and non-injected, sclera anicteric OROPHARYNX: MMM, no exudates, no oropharyngeal erythema or ulceration NECK: supple, no  JVD LYMPH:  no palpable lymphadenopathy in the cervical, axillary or inguinal regions LUNGS: clear to auscultation b/l with normal respiratory effort HEART: regular rate & rhythm ABDOMEN:  normoactive bowel sounds , non tender, not distended. No palpable hepatosplenomegaly.  Extremity: no pedal edema PSYCH: alert & oriented x 3 with fluent speech NEURO: no focal motor/sensory deficits    LABORATORY DATA:  I have reviewed the data as listed  . CBC Latest Ref Rng & Units 06/01/2018 04/05/2018 03/01/2018  WBC 4.0 - 10.5 K/uL 6.0 7.2 7.1  Hemoglobin 13.0 - 17.0 g/dL 8.8(L) 9.2(L) 8.9(L)  Hematocrit 39.0 - 52.0 % 27.9(L) 29.0(L) 28.0(L)  Platelets 150 - 400 K/uL 125(L) 132(L) 150   . CBC    Component Value Date/Time   WBC 6.0 06/01/2018 1359   RBC 2.33 (L) 06/01/2018 1359   HGB 8.8 (L) 06/01/2018 1359   HGB 8.9 (L) 03/01/2018 1019   HCT 27.9 (L) 06/01/2018 1359   PLT 125 (L) 06/01/2018 1359   PLT 150 03/01/2018 1019   MCV 119.7 (H) 06/01/2018 1359   MCH 37.8 (H) 06/01/2018 1359   MCHC 31.5 06/01/2018 1359   RDW 16.2 (H) 06/01/2018 1359   LYMPHSABS 0.8 06/01/2018 1359   MONOABS 0.9 06/01/2018 1359   EOSABS 0.0 06/01/2018 1359   BASOSABS 0.0 06/01/2018 1359    . CMP Latest Ref Rng & Units 06/01/2018 04/05/2018 03/01/2018  Glucose 70 - 99 mg/dL 200(H) 182(H) 167(H)  BUN 8 - 23 mg/dL 28(H) 24(H) 21  Creatinine 0.61 - 1.24 mg/dL 1.29(H) 1.23 1.26(H)  Sodium 135 - 145 mmol/L 143 142 140  Potassium 3.5 - 5.1 mmol/L 4.5 4.3 4.4  Chloride 98 - 111 mmol/L 107 108 106  CO2 22 - 32 mmol/L 26 26 25   Calcium 8.9 - 10.3 mg/dL 9.6 9.7 9.9  Total Protein 6.5 - 8.1 g/dL 7.1 7.2 7.4  Total Bilirubin 0.3 - 1.2 mg/dL 0.8 0.7 1.0  Alkaline Phos 38 - 126 U/L 74 63 65  AST 15 - 41 U/L 14(L) 12(L) 15  ALT 0 - 44 U/L 13 15 14    02/23/18 BM Bx:   02/23/18 BM Flow Cytometry:   02/23/18 Cytogenetics:  RADIOGRAPHIC STUDIES: I have personally reviewed the radiological images as listed and  agreed with the findings in the report. No results found.  ASSESSMENT & PLAN:  78 y.o. 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 No overt clinical evidence of acute bleeding.  Fecal occult blood test x1-.  Clinically no reported bleeding. Ferritin is in normal limits. B12 and folate within normal limits.  Normal RDW suggests against overt nutritional deficiency. TSH within normal limits Peripheral blood smear evidence of anisopoikilocytosis. Hgb has improved from 7.8 to 8.9 without transfusion.  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 Now resolved.  PLAN:  -Discussed pt labwork today, 06/01/18; HGB a little lower at 8.8 Monocytes slightly improved to 900 today, other blood counts and chemistries are stable. LDH at 205. -No indication for a blood transfusion at this time -Pt is clinically stable and his labs remain mostly stable -Overall picture consistent with CMML, type 1 -Will continue watchful observation, with supportive therapies as needed, at this time -Will consider targeted therapies if necessary  -Pt will monitor for new fatigue, abnormal bruising, and abnormal bleeding and will let me know if he develops any of these  -Discussed that the only cure for the possible conditions in the differential, are bone marrow transplants, which the pt would not like to consider -Unclear chronicity of blood count abnormalities the patient is relatively asymptomatic at this time with regards to his anemia at rest. -Will see the pt back in 3 months, sooner if any new concerns of  light headedness or fatigue    RTC with Dr Irene Limbo with labs in 7months    All of the patients questions were answered with apparent satisfaction. The patient knows to call the clinic with any problems, questions or concerns.  The total time spent in the appt was 20 minutes and more than 50% was on counseling and direct patient cares.    Sullivan Lone MD MS AAHIVMS [facility] Hematology/Oncology Physician [facility]  (Office):       [phone] (Work cell):  [phone] (Fax):           [phone]  06/01/2018 3:31 PM  I, Baldwin Jamaica, am acting as a scribe for Dr. Sullivan Lone.   .I have reviewed the above documentation for accuracy and completeness, and I agree with the above. Brunetta Genera MD

## 2018-08-24 ENCOUNTER — Other Ambulatory Visit: Payer: Self-pay | Admitting: Cardiology

## 2018-08-26 ENCOUNTER — Encounter: Payer: Self-pay | Admitting: Cardiology

## 2018-08-26 ENCOUNTER — Other Ambulatory Visit: Payer: Self-pay

## 2018-08-26 ENCOUNTER — Ambulatory Visit (INDEPENDENT_AMBULATORY_CARE_PROVIDER_SITE_OTHER): Payer: Medicare Other | Admitting: Cardiology

## 2018-08-26 VITALS — BP 135/75 | HR 66 | Ht 75.0 in | Wt 185.0 lb

## 2018-08-26 DIAGNOSIS — I25118 Atherosclerotic heart disease of native coronary artery with other forms of angina pectoris: Secondary | ICD-10-CM

## 2018-08-26 DIAGNOSIS — I10 Essential (primary) hypertension: Secondary | ICD-10-CM | POA: Diagnosis not present

## 2018-08-26 NOTE — Progress Notes (Signed)
Virtual Visit via Video Note   Subjective:   Steve Maldonado, male    DOB: February 28, 1941, 78 y.o.   MRN: [id number]   I connected with the patient on 08/26/18 by a telephone call. I verified that I am speaking with the correct person using two identifiers.     I discussed the limitations of evaluation and management by telemedicine and the availability of in person appointments. The patient expressed understanding and agreed to proceed.   This visit type was conducted due to national recommendations for restrictions regarding the COVID-19 Pandemic (e.g. social distancing).  This format is felt to be most appropriate for this patient at this time.  All issues noted in this document were discussed and addressed.  No physical exam was performed (except for noted visual exam findings with Tele health visits).  The patient has consented to conduct a Tele health visit and understands insurance will be billed.     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  Since his last visit me, he has been doing fairly well. He has noticed improvement in his shortness of breath. He is able to do yardwork around the house, without much difficulty. He denies any chest pain. He has not had any recurrence of lightheadedness, like what he had back in 01/2018. Macrocytic anemia has bene stable. He has regular f/u w/Dr. Irene Limbo regarding the same.    Past Medical History:  Diagnosis Date  . Hyperlipidemia   . Hypertension   . Stroke [facility]) 1970     Past Surgical History:  Procedure Laterality Date  . APPENDECTOMY  1980  . INTRAVASCULAR ULTRASOUND/IVUS N/A 12/10/2016   Procedure: Intravascular Ultrasound/IVUS;  Surgeon: Nigel Mormon, MD;  Location: [place] [facility];  Service: Cardiovascular;  Laterality: N/A;  . LEFT HEART CATH AND CORONARY ANGIOGRAPHY N/A  12/10/2016   Procedure: LEFT HEART CATH AND CORONARY ANGIOGRAPHY;  Surgeon: Nigel Mormon, MD;  Location: [place] [facility];  Service: Cardiovascular;  Laterality: N/A;     Social History   Socioeconomic History  . Marital status: Single    Spouse name: Not on file  . Number of children: Not on file  . Years of education: Not on file  . Highest education level: Not on file  Occupational History  . Not on file  Social Needs  . Financial resource strain: Not on file  . Food insecurity:    Worry: Not on file    Inability: Not on file  . Transportation needs:    Medical: Not on file    Non-medical: Not on file  Tobacco Use  . Smoking status: Former Smoker    Packs/day: 1.00    Years: 20.00    Pack years: 20.00    Types: Cigarettes    Last attempt to quit: 03/23/1978    Years since quitting: 40.4  . Smokeless tobacco: Never Used  Substance and Sexual Activity  . Alcohol use: Yes    Alcohol/week: 0.0 standard drinks    Comment: wine/has not had any in 1 year  . Drug use: No  . Sexual activity: Not on file  Lifestyle  . Physical activity:    Days per week: Not on file    Minutes per session: Not on file  . Stress: Not on file  Relationships  . Social connections:    Talks on  phone: Not on file    Gets together: Not on file    Attends religious service: Not on file    Active member of club or organization: Not on file    Attends meetings of clubs or organizations: Not on file    Relationship status: Not on file  . Intimate partner violence:    Fear of current or ex partner: Not on file    Emotionally abused: Not on file    Physically abused: Not on file    Forced sexual activity: Not on file  Other Topics Concern  . Not on file  Social History Narrative  . Not on file     Family History  Problem Relation Age of Onset  . Colon cancer Neg Hx   . Esophageal cancer Neg Hx   . Rectal cancer Neg Hx   . Stomach cancer Neg Hx      Current Outpatient  Medications on File Prior to Visit  Medication Sig Dispense Refill  . carvedilol (COREG) 3.125 MG tablet Take 1 tablet (3.125 mg total) by mouth 2 (two) times daily. 60 tablet 0  . JARDIANCE 25 MG TABS tablet Take 25 mg by mouth at bedtime.    . Multiple Vitamins-Minerals (CENTRUM SILVER PO) Take 1 tablet by mouth at bedtime.     . rosuvastatin (CRESTOR) 20 MG tablet Take 20 mg by mouth at bedtime.  4  . SitaGLIPtin-MetFORMIN HCl (JANUMET XR) [id number] MG TB24 Take 1 tablet by mouth every evening.     No current facility-administered medications on file prior to visit.     Cardiovascular studies:  Coronary angiogram 12/10/2016: Nonobstructive calcific CAD by IVUS in LM and mid LAD LCx/OM1 bifurcation 50/80% lesion   Recommend aggressive medical management at this time.   Echocardiogram 02/18/2018: - Left ventricle: The cavity size was normal. Wall thickness was   normal. Systolic function was normal. The estimated ejection   fraction was in the range of 50% to 55%. Wall motion was normal;   there were no regional wall motion abnormalities. Doppler   parameters are consistent with abnormal left ventricular   relaxation (grade 1 diastolic dysfunction). - Mitral valve: There was mild regurgitation. - Pulmonary arteries: Maldonado peak pressure: 32 mm Hg (S). - Pericardium, extracardiac: A trivial pericardial effusion was   identified.   Impressions:   - No significant change compared to previous outpatient   echocardiogram dated 11/19/2016.   Exercise myoview stress 11/20/2016: 1. The resting electrocardiogram demonstrated normal sinus rhythm, normal resting conduction and no resting arrhythmias.  Cannot exclude inferior infarct and anteerior infarct, old. The stress electrocardiogram was negative for ischemia. There were occasional PVC. Patient exercised on Bruce protocol for 5:00 minutes and achieved 6.06 METS. Stress test terminated due to dyspnea and 99% MPHR achieved (Target HR  >85%).  2. 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  There is hypokinesis of the basal inferior, basal inferolateral, mid inferior, mid inferolateral, apical inferior and apical lateral myocardial wall(s).  The left ventricular ejection fraction was calculated or visually estimated to be 35%.  This is an high risk study.   Recent labs: Results for Steve Maldonado, Steve Maldonado (MRN [id number]) as of 08/26/2018 05:21  Ref. Range 06/01/2018 13:59  Sodium Latest Ref Range: 135 - 145 mmol/L 143  Potassium Latest Ref Range: 3.5 - 5.1 mmol/L 4.5  Chloride Latest Ref Range:  98 - 111 mmol/L 107  CO2 Latest Ref Range: 22 - 32 mmol/L 26  Glucose Latest Ref Range: 70 - 99 mg/dL 200 (H)  BUN Latest Ref Range: 8 - 23 mg/dL 28 (H)  Creatinine Latest Ref Range: 0.61 - 1.24 mg/dL 1.29 (H)  Calcium Latest Ref Range: 8.9 - 10.3 mg/dL 9.6  Anion gap Latest Ref Range: 5 - 15  10  Alkaline Phosphatase Latest Ref Range: 38 - 126 U/L 74  Albumin Latest Ref Range: 3.5 - 5.0 g/dL 4.2  AST Latest Ref Range: 15 - 41 U/L 14 (L)  ALT Latest Ref Range: 0 - 44 U/L 13  Total Protein Latest Ref Range: 6.5 - 8.1 g/dL 7.1  Total Bilirubin Latest Ref Range: 0.3 - 1.2 mg/dL 0.8  GFR, Est Non African American Latest Ref Range: >60 mL/min 53 (L)  GFR, Est African American Latest Ref Range: >60 mL/min >60  LDH Latest Ref Range: 98 - 192 U/L 205 (H)   Results for Steve Maldonado, Steve Maldonado (MRN [id number]) as of 08/26/2018 05:21  Ref. Range 06/01/2018 13:59  WBC Latest Ref Range: 4.0 - 10.5 K/uL 6.0  RBC Latest Ref Range: 4.22 - 5.81 MIL/uL 2.33 (L)  Hemoglobin Latest Ref Range: 13.0 - 17.0 g/dL 8.8 (L)  HCT Latest Ref Range: 39.0 - 52.0 % 27.9 (L)  MCV Latest Ref Range: 80.0 - 100.0 fL 119.7 (H)  MCH Latest Ref Range: 26.0 - 34.0 pg 37.8 (H)  MCHC Latest Ref Range: 30.0 - 36.0 g/dL 31.5  RDW  Latest Ref Range: 11.5 - 15.5 % 16.2 (H)  Platelets Latest Ref Range: 150 - 400 K/uL 125 (L)  nRBC Latest Ref Range: 0.0 - 0.2 % 0.5 (H)      Review of Systems  Constitution: Negative for decreased appetite, malaise/fatigue, weight gain and weight loss.  HENT: Negative for congestion.   Eyes: Negative for visual disturbance.  Cardiovascular: Negative for chest pain, dyspnea on exertion, leg swelling, palpitations and syncope.  Respiratory: Negative for cough.   Endocrine: Negative for cold intolerance.  Hematologic/Lymphatic: Does not bruise/bleed easily.  Skin: Negative for itching and rash.  Musculoskeletal: Negative for myalgias.  Gastrointestinal: Negative for abdominal pain, nausea and vomiting.  Genitourinary: Negative for dysuria.  Neurological: Negative for dizziness and weakness.  Psychiatric/Behavioral: The patient is not nervous/anxious.   All other systems reviewed and are negative.        Vitals:   08/26/18 0819  BP: 135/75  Pulse: 66   (Measured by the patient using a home BP monitor)  Body mass index is 23.12 kg/m. Filed Weights   08/26/18 0819  Weight: 185 lb (83.9 kg)    Observation/findings during video visit   Objective:    Physical Exam  Constitutional: He is oriented to person, place, and time. He appears well-developed and well-nourished. No distress.  Pulmonary/Chest: Effort normal.  Neurological: He is alert and oriented to person, place, and time.  Psychiatric: He has a normal mood and affect.  Nursing note and vitals reviewed.         Assessment & Recommendations:   78 y/o Caucasian male with controlled hypertension, type 2 DM, moderate nonobstructie CAD, hyperlipidemia, CKD stage 3, macrocytic anemia, ongoing workup for possible mylodysplastic neoplasm/syndrome.  CAD: Stable. Continue Aspirin, statin, metoprolol.   Macrocytic anemia: Ongoing workup. Follow up with Dr. Irene Limbo.  Hypertension, type 2 DM, hyperlipidemia:  Controlled. Continue current medical therapy.  I will see him back in 6 months.    Steve Maldonado  Esther Hardy, MD [facility] Cardiovascular. Mal[place] Pager: [phone] Office: [phone] If no answer Cell [phone]

## 2018-08-31 NOTE — Progress Notes (Signed)
HEMATOLOGY/ONCOLOGY CLINIC NOTE  Date of Service: 09/01/2018  Patient Care Team: Burnard Bunting, MD as PCP - General (Internal Medicine)  CHIEF COMPLAINTS/PURPOSE OF CONSULTATION:  Macrocytic Anemia Thrombocytopenia   HISTORY OF PRESENTING ILLNESS:  Steve Maldonado is a wonderful 78 y.o. male who has been referred to [place] by Dr Cyndia Skeeters, Charlesetta Ivory, MD  for evaluation and management of macrocytic anemia and thrombocytopenia.  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  Patient was that he has been feeling well overall and was recently seen by Dr. Reynaldo Minium his primary care physician about 7 to 10 days prior to admission and is not aware of any abnormal labs at the time.  He does not recollect the exact blood counts at the time.  He does note that he had labs about 7 to 10 days prior to admission as well as 6 months prior to admission and there was no reported concerns on those.  Patient notes that he was cooking Thanksgiving lunch and developed lightheadedness and dizziness with presyncopal symptomatology.  He had to sit down on recliner and the dizziness still persisted for about 3 minutes and was associated with mild chest tightness resulting in a call to EMS who reportedly noted a blood pressure of 70 systolic. He reports that he had a had a good breakfast and was drinking enough water. He notes that prior to this episode he was feeling well and had no real new concerns or other symptoms.  In the emergency room his EKG showed sinus bradycardia with a heart rate of 58 bpm.  His blood pressure improved with fluid resuscitation.  Labs on admission -CBC showed a hemoglobin of 8.2 with an MCV of 122, RDW of 15, platelets of 114k, WBC counts within normal limits at 6.7k with normal differential.  He was noted to have some mild monocytosis of 1.2k.  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  B12 within normal limits at 673, folate of 41.8, TSH within normal limits at 2.12. Patient denies any history of liver disease. Patient denies significant alcohol use. No other new focal symptomatology.  Absolute reticulocyte count of 61k with increase in immature reticulocyte fraction. Denies any overt bleeding.  Normal bowel movements no hematochezia or melena. LDH was within normal limits at 175 and bilirubin was within normal limits and suggest against overt hemolysis.  Repeat labs today show hemoglobin is at 7.8 with platelet counts of 104k Patient notes no acute symptomatology and no overt dizziness while in bed. No bone pains.  No abnormal bruising. No fevers no chills no night sweats no abnormal weight loss. No other revealing focal symptomatology.  Interval History:   Steve Maldonado returns today for management and evaluation of his macrocytic anemia and thrombocytopenia. The patient's last visit with [place] was on 06/01/18. The pt reports that he is doing well overall.  The pt reports that he has not developed any new concerns in the interim. He has continued on a daily multivitamin and fish oil. He denies concerns for infections, abnormal bruising or bleeding. He endorses good energy levels overall. He is staying active with walking, and notes that he can walk about 20 minutes before needing to pause to rest, and notes that his SOB after walking resolves on its own within 5 minutes. He has a nitroglycerin pill for use as needed, and denies  having occasion to use this yet.  Lab results today (09/01/18) of CBC w/diff and CMP is as follows: all values are WNL except for RBC at 2.69, HGB at 10.1, HCT at 32.4, MCV at 120.4, MCH at 37.5, RDW at 17.2, PLT at 141k, nRBC at 0.3%, Glucose at 189, GFR at 56.  On review of systems, pt reports good energy levels, eating well, sleeping well, staying active,  and denies concerns for infections, abnormal bruising, bleeding, fatigue, and any other symptoms.   MEDICAL HISTORY:  Past Medical History:  Diagnosis Date  . Hyperlipidemia   . Hypertension   . Stroke [facility]) 1970    SURGICAL HISTORY: Past Surgical History:  Procedure Laterality Date  . APPENDECTOMY  1980  . INTRAVASCULAR ULTRASOUND/IVUS N/A 12/10/2016   Procedure: Intravascular Ultrasound/IVUS;  Surgeon: Nigel Mormon, MD;  Location: [place] [facility];  Service: Cardiovascular;  Laterality: N/A;  . LEFT HEART CATH AND CORONARY ANGIOGRAPHY N/A 12/10/2016   Procedure: LEFT HEART CATH AND CORONARY ANGIOGRAPHY;  Surgeon: Nigel Mormon, MD;  Location: [place] [facility];  Service: Cardiovascular;  Laterality: N/A;    SOCIAL HISTORY: Social History   Socioeconomic History  . Marital status: Single    Spouse name: Not on file  . Number of children: Not on file  . Years of education: Not on file  . Highest education level: Not on file  Occupational History  . Not on file  Social Needs  . Financial resource strain: Not on file  . Food insecurity    Worry: Not on file    Inability: Not on file  . Transportation needs    Medical: Not on file    Non-medical: Not on file  Tobacco Use  . Smoking status: Former Smoker    Packs/day: 1.00    Years: 20.00    Pack years: 20.00    Types: Cigarettes    Quit date: 03/23/1978    Years since quitting: 40.4  . Smokeless tobacco: Never Used  Substance and Sexual Activity  . Alcohol use: Yes    Alcohol/week: 0.0 standard drinks    Comment: wine/has not had any in 1 year  . Drug use: No  . Sexual activity: Not on file  Lifestyle  . Physical activity    Days per week: Not on file    Minutes per session: Not on file  . Stress: Not on file  Relationships  . Social Herbalist on phone: Not on file    Gets together: Not on file    Attends religious service: Not on file    Active member of club or organization: Not  on file    Attends meetings of clubs or organizations: Not on file    Relationship status: Not on file  . Intimate partner violence    Fear of current or ex partner: Not on file    Emotionally abused: Not on file    Physically abused: Not on file    Forced sexual activity: Not on file  Other Topics Concern  . Not on file  Social History Narrative  . Not on file    FAMILY HISTORY: Family History  Problem Relation Age of Onset  . Colon cancer Neg Hx   . Esophageal cancer Neg Hx   . Rectal cancer Neg Hx   . Stomach cancer Neg Hx     ALLERGIES:  has No Known Allergies.  MEDICATIONS:  Current Outpatient Medications  Medication Sig Dispense Refill  .  aspirin EC 81 MG tablet Take 81 mg by mouth daily.    Marland Kitchen JARDIANCE 25 MG TABS tablet Take 25 mg by mouth at bedtime.    . metoprolol succinate (TOPROL-XL) 50 MG 24 hr tablet TAKE 1 TABLET BY MOUTH DAILY 90 tablet 2  . Multiple Vitamins-Minerals (CENTRUM SILVER PO) Take 1 tablet by mouth at bedtime.     . rosuvastatin (CRESTOR) 20 MG tablet Take 20 mg by mouth at bedtime.  4  . SitaGLIPtin-MetFORMIN HCl (JANUMET XR) [phone] MG TB24 Take 1 tablet by mouth every evening.     No current facility-administered medications for this visit.     REVIEW OF SYSTEMS:    A 10+ POINT REVIEW OF SYSTEMS WAS OBTAINED including neurology, dermatology, psychiatry, cardiac, respiratory, lymph, extremities, GI, GU, Musculoskeletal, constitutional, breasts, reproductive, HEENT.  All pertinent positives are noted in the HPI.  All others are negative.   PHYSICAL EXAMINATION:  Vitals:   09/01/18 0952  BP: 111/67  Pulse: 73  Resp: 18  Temp: 98.5 F (36.9 C)  SpO2: 98%   Filed Weights   09/01/18 0952  Weight: 185 lb 6.4 oz (84.1 kg)   .Body mass index is 23.17 kg/m.  GENERAL:alert, in no acute distress and comfortable SKIN: no acute rashes, no significant lesions EYES: conjunctiva are pink and non-injected, sclera anicteric OROPHARYNX: MMM,  no exudates, no oropharyngeal erythema or ulceration NECK: supple, no JVD LYMPH:  no palpable lymphadenopathy in the cervical, axillary or inguinal regions LUNGS: clear to auscultation b/l with normal respiratory effort HEART: regular rate & rhythm ABDOMEN:  normoactive bowel sounds , non tender, not distended. No palpable hepatosplenomegaly.  Extremity: no pedal edema PSYCH: alert & oriented x 3 with fluent speech NEURO: no focal motor/sensory deficits   LABORATORY DATA:  I have reviewed the data as listed  . CBC Latest Ref Rng & Units 09/01/2018 06/01/2018 04/05/2018  WBC 4.0 - 10.5 K/uL 6.9 6.0 7.2  Hemoglobin 13.0 - 17.0 g/dL 10.1(L) 8.8(L) 9.2(L)  Hematocrit 39.0 - 52.0 % 32.4(L) 27.9(L) 29.0(L)  Platelets 150 - 400 K/uL 141(L) 125(L) 132(L)   . CBC    Component Value Date/Time   WBC 6.9 09/01/2018 0832   RBC 2.69 (L) 09/01/2018 0832   HGB 10.1 (L) 09/01/2018 0832   HGB 8.9 (L) 03/01/2018 1019   HCT 32.4 (L) 09/01/2018 0832   PLT 141 (L) 09/01/2018 0832   PLT 150 03/01/2018 1019   MCV 120.4 (H) 09/01/2018 0832   MCH 37.5 (H) 09/01/2018 0832   MCHC 31.2 09/01/2018 0832   RDW 17.2 (H) 09/01/2018 0832   LYMPHSABS 0.8 09/01/2018 0832   MONOABS 1.0 09/01/2018 0832   EOSABS 0.0 09/01/2018 0832   BASOSABS 0.0 09/01/2018 0832    . CMP Latest Ref Rng & Units 09/01/2018 06/01/2018 04/05/2018  Glucose 70 - 99 mg/dL 189(H) 200(H) 182(H)  BUN 8 - 23 mg/dL 22 28(H) 24(H)  Creatinine 0.61 - 1.24 mg/dL 1.23 1.29(H) 1.23  Sodium 135 - 145 mmol/L 140 143 142  Potassium 3.5 - 5.1 mmol/L 4.8 4.5 4.3  Chloride 98 - 111 mmol/L 106 107 108  CO2 22 - 32 mmol/L 23 26 26   Calcium 8.9 - 10.3 mg/dL 9.5 9.6 9.7  Total Protein 6.5 - 8.1 g/dL 7.2 7.1 7.2  Total Bilirubin 0.3 - 1.2 mg/dL 0.7 0.8 0.7  Alkaline Phos 38 - 126 U/L 60 74 63  AST 15 - 41 U/L 15 14(L) 12(L)  ALT 0 - 44 U/L 15  13 15   02/23/18 BM Bx:   02/23/18 BM Flow Cytometry:   02/23/18 Cytogenetics:    RADIOGRAPHIC  STUDIES: I have personally reviewed the radiological images as listed and agreed with the findings in the report. No results found.  ASSESSMENT & PLAN:  78 y.o. 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 No overt clinical evidence of acute bleeding.  Fecal occult blood test x1-.  Clinically no reported bleeding. Ferritin is in normal limits. B12 and folate within normal limits.  Normal RDW suggests against overt nutritional deficiency. TSH within normal limits Peripheral blood smear evidence of anisopoikilocytosis. Hgb has improved from 7.8 to 8.9 without transfusion.  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 Now resolved.  PLAN:  -Discussed pt labwork today, 09/01/18; HGB improved to 10.1, PLT improved to 141k, Monocytes stable at 1.0k -Overall picture consistent with CMML, type 1 -Asked that if the pt develops concerns for infections, bleeding, or fatigue, that he let me know in the interim -Will continue watchful observation at this time, given clinical stability and lab stability. Will continue supportive therapies as needed. -Will consider targeted therapies if necessary  -Discussed that the only cure for the possible conditions in the differential, are bone marrow transplants, which the pt would not like to consider -Unclear chronicity of blood count abnormalities the patient is relatively asymptomatic at this time with regards to his anemia at rest. -Will see the pt back in 4 months   RTC with Dr Irene Limbo with labs in 4 months    All of the patients questions were  answered with apparent satisfaction. The patient knows to call the clinic with any problems, questions or concerns.  The total time spent in the appt was 15 minutes and more than 50% was on counseling and direct patient cares.    Sullivan Lone MD MS AAHIVMS [facility] Hematology/Oncology Physician [facility]  (Office):       [phone] (Work cell):  [phone] (Fax):           [phone]  09/01/2018 10:08 AM  I, Baldwin Jamaica, am acting as a scribe for Dr. Sullivan Lone.   .I have reviewed the above documentation for accuracy and completeness, and I agree with the above. Brunetta Genera MD

## 2018-09-01 ENCOUNTER — Inpatient Hospital Stay (HOSPITAL_BASED_OUTPATIENT_CLINIC_OR_DEPARTMENT_OTHER): Payer: Medicare Other | Admitting: Hematology

## 2018-09-01 ENCOUNTER — Inpatient Hospital Stay: Payer: Medicare Other | Attending: Hematology

## 2018-09-01 ENCOUNTER — Telehealth: Payer: Self-pay | Admitting: Hematology

## 2018-09-01 ENCOUNTER — Other Ambulatory Visit: Payer: Self-pay

## 2018-09-01 VITALS — BP 111/67 | HR 73 | Temp 98.5°F | Resp 18 | Ht 75.0 in | Wt 185.4 lb

## 2018-09-01 DIAGNOSIS — D539 Nutritional anemia, unspecified: Secondary | ICD-10-CM | POA: Insufficient documentation

## 2018-09-01 DIAGNOSIS — Z79899 Other long term (current) drug therapy: Secondary | ICD-10-CM

## 2018-09-01 DIAGNOSIS — D696 Thrombocytopenia, unspecified: Secondary | ICD-10-CM

## 2018-09-01 DIAGNOSIS — D469 Myelodysplastic syndrome, unspecified: Secondary | ICD-10-CM

## 2018-09-01 LAB — CBC WITH DIFFERENTIAL/PLATELET
Abs Immature Granulocytes: 0.03 10*3/uL (ref 0.00–0.07)
Basophils Absolute: 0 10*3/uL (ref 0.0–0.1)
Basophils Relative: 0 %
Eosinophils Absolute: 0 10*3/uL (ref 0.0–0.5)
Eosinophils Relative: 0 %
HCT: 32.4 % — ABNORMAL LOW (ref 39.0–52.0)
Hemoglobin: 10.1 g/dL — ABNORMAL LOW (ref 13.0–17.0)
Immature Granulocytes: 0 %
Lymphocytes Relative: 11 %
Lymphs Abs: 0.8 10*3/uL (ref 0.7–4.0)
MCH: 37.5 pg — ABNORMAL HIGH (ref 26.0–34.0)
MCHC: 31.2 g/dL (ref 30.0–36.0)
MCV: 120.4 fL — ABNORMAL HIGH (ref 80.0–100.0)
Monocytes Absolute: 1 10*3/uL (ref 0.1–1.0)
Monocytes Relative: 14 %
Neutro Abs: 5.1 10*3/uL (ref 1.7–7.7)
Neutrophils Relative %: 75 %
Platelets: 141 10*3/uL — ABNORMAL LOW (ref 150–400)
RBC: 2.69 MIL/uL — ABNORMAL LOW (ref 4.22–5.81)
RDW: 17.2 % — ABNORMAL HIGH (ref 11.5–15.5)
WBC: 6.9 10*3/uL (ref 4.0–10.5)
nRBC: 0.3 % — ABNORMAL HIGH (ref 0.0–0.2)

## 2018-09-01 LAB — CMP (CANCER CENTER ONLY)
ALT: 15 U/L (ref 0–44)
AST: 15 U/L (ref 15–41)
Albumin: 4.4 g/dL (ref 3.5–5.0)
Alkaline Phosphatase: 60 U/L (ref 38–126)
Anion gap: 11 (ref 5–15)
BUN: 22 mg/dL (ref 8–23)
CO2: 23 mmol/L (ref 22–32)
Calcium: 9.5 mg/dL (ref 8.9–10.3)
Chloride: 106 mmol/L (ref 98–111)
Creatinine: 1.23 mg/dL (ref 0.61–1.24)
GFR, Est AFR Am: >60 mL/min (ref >60)
GFR, Estimated: 56 mL/min — ABNORMAL LOW (ref >60)
Glucose, Bld: 189 mg/dL — ABNORMAL HIGH (ref 70–99)
Potassium: 4.8 mmol/L (ref 3.5–5.1)
Sodium: 140 mmol/L (ref 135–145)
Total Bilirubin: 0.7 mg/dL (ref 0.3–1.2)
Total Protein: 7.2 g/dL (ref 6.5–8.1)

## 2018-09-01 LAB — SAMPLE TO BLOOD BANK

## 2018-09-01 NOTE — Telephone Encounter (Signed)
Scheduled appt per 6/11 los. A calendar will be mailed out. °

## 2018-10-07 ENCOUNTER — Other Ambulatory Visit: Payer: Self-pay | Admitting: Cardiology

## 2018-12-31 NOTE — Progress Notes (Signed)
HEMATOLOGY/ONCOLOGY CLINIC NOTE  Date of Service: 01/02/2019  Patient Care Team: Burnard Bunting, MD as PCP - General (Internal Medicine)  CHIEF COMPLAINTS/PURPOSE OF CONSULTATION:  Macrocytic Anemia Thrombocytopenia   HISTORY OF PRESENTING ILLNESS:  Steve Maldonado is a wonderful 78 y.o. male who has been referred to [place] by Dr Steve Maldonado, Steve Ivory, MD  for evaluation and management of macrocytic anemia and thrombocytopenia.  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  Patient was that he has been feeling well overall and was recently seen by Dr. Reynaldo Maldonado his primary care physician about 7 to 10 days prior to admission and is not aware of any abnormal labs at the time.  He does not recollect the exact blood counts at the time.  He does note that he had labs about 7 to 10 days prior to admission as well as 6 months prior to admission and there was no reported concerns on those.  Patient notes that he was cooking Thanksgiving lunch and developed lightheadedness and dizziness with presyncopal symptomatology.  He had to sit down on recliner and the dizziness still persisted for about 3 minutes and was associated with mild chest tightness resulting in a call to EMS who reportedly noted a blood pressure of 70 systolic. He reports that he had a had a good breakfast and was drinking enough water. He notes that prior to this episode he was feeling well and had no real new concerns or other symptoms.  In the emergency room his EKG showed sinus bradycardia with a heart rate of 58 bpm.  His blood pressure improved with fluid resuscitation.  Labs on admission -CBC showed a hemoglobin of 8.2 with an MCV of 122, RDW of 15, platelets of 114k, WBC counts within normal limits at 6.7k with normal differential.  He was noted to have some mild monocytosis of 1.2k.  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  B12 within normal limits at 673, folate of 41.8, TSH within normal limits at 2.12. Patient denies any history of liver disease. Patient denies significant alcohol use. No other new focal symptomatology.  Absolute reticulocyte count of 61k with increase in immature reticulocyte fraction. Denies any overt bleeding.  Normal bowel movements no hematochezia or melena. LDH was within normal limits at 175 and bilirubin was within normal limits and suggest against overt hemolysis.  Repeat labs today show hemoglobin is at 7.8 with platelet counts of 104k Patient notes no acute symptomatology and no overt dizziness while in bed. No bone pains.  No abnormal bruising. No fevers no chills no night sweats no abnormal weight loss. No other revealing focal symptomatology.  Interval History:   Steve Maldonado returns today for management and evaluation of his macrocytic anemia and thrombocytopenia. The patient's last visit with [place] was on 09/01/2018. The pt reports that he is doing well overall.  The pt reports that he has been feeling good and maintaining a high energy level. He has continued to mow the grass and work around the house. Pt takes a multivitamin daily (Centrum Silver). He will see his PCP in December.   Lab results today (01/02/19) of CBC w/diff and CMP is as follows: all values are WNL except for RBC at 2.75, Hgb at 10.3, HCT at 32.8, MCV at 119.3, MCH at 37.5, RDW at 18.7, nRBC at 0.3, Monos Abs at  1.1K, Glucose at 227, BUN at 25, AST at 13, GFR Est Non Af Am at 56. 01/02/2019 LDH at 184 01/02/2019 Reticulocytes is as follows: Retic Ct Pct at 3.7, RBC at 2.73, Retic Count Abs at 99.6, Immature Retic Fract at 22.4  On review of systems, pt reports sleeping well, some SOB and denies fatigue, bleeding issues, mouth sores, rashes, chest pain, new bone pain, abdominal pain and any other symptoms.    MEDICAL HISTORY:  Past Medical History:  Diagnosis Date  . Hyperlipidemia   . Hypertension   . Stroke [facility]) 1970    SURGICAL HISTORY: Past Surgical History:  Procedure Laterality Date  . APPENDECTOMY  1980  . INTRAVASCULAR ULTRASOUND/IVUS N/A 12/10/2016   Procedure: Intravascular Ultrasound/IVUS;  Surgeon: Steve Mormon, MD;  Location: [place] [facility];  Service: Cardiovascular;  Laterality: N/A;  . LEFT HEART CATH AND CORONARY ANGIOGRAPHY N/A 12/10/2016   Procedure: LEFT HEART CATH AND CORONARY ANGIOGRAPHY;  Surgeon: Steve Mormon, MD;  Location: [place] [facility];  Service: Cardiovascular;  Laterality: N/A;    SOCIAL HISTORY: Social History   Socioeconomic History  . Marital status: Single    Spouse name: Not on file  . Number of children: Not on file  . Years of education: Not on file  . Highest education level: Not on file  Occupational History  . Not on file  Social Needs  . Financial resource strain: Not on file  . Food insecurity    Worry: Not on file    Inability: Not on file  . Transportation needs    Medical: Not on file    Non-medical: Not on file  Tobacco Use  . Smoking status: Former Smoker    Packs/day: 1.00    Years: 20.00    Pack years: 20.00    Types: Cigarettes    Quit date: 03/23/1978    Years since quitting: 40.8  . Smokeless tobacco: Never Used  Substance and Sexual Activity  . Alcohol use: Yes    Alcohol/week: 0.0 standard drinks    Comment: wine/has not had any in 1 year  . Drug use: No  . Sexual activity: Not on file  Lifestyle  . Physical activity    Days per week: Not on file    Minutes per session: Not on file  . Stress: Not on file  Relationships  . Social Herbalist on phone: Not on file    Gets together: Not on file    Attends religious service: Not on file    Active member of club or organization: Not on file    Attends meetings of clubs or organizations: Not on file    Relationship status: Not on  file  . Intimate partner violence    Fear of current or ex partner: Not on file    Emotionally abused: Not on file    Physically abused: Not on file    Forced sexual activity: Not on file  Other Topics Concern  . Not on file  Social History Narrative  . Not on file    FAMILY HISTORY: Family History  Problem Relation Age of Onset  . Colon cancer Neg Hx   . Esophageal cancer Neg Hx   . Rectal cancer Neg Hx   . Stomach cancer Neg Hx     ALLERGIES:  has No Known Allergies.  MEDICATIONS:  Current Outpatient Medications  Medication Sig Dispense Refill  . aspirin EC 81 MG tablet Take 81 mg  by mouth daily.    Marland Kitchen JARDIANCE 25 MG TABS tablet Take 25 mg by mouth at bedtime.    . metoprolol succinate (TOPROL-XL) 50 MG 24 hr tablet TAKE 1 TABLET BY MOUTH DAILY 90 tablet 2  . Multiple Vitamins-Minerals (CENTRUM SILVER PO) Take 1 tablet by mouth at bedtime.     . rosuvastatin (CRESTOR) 20 MG tablet TAKE 1 TABLET BY MOUTH EVERY DAY 30 tablet 3  . SitaGLIPtin-MetFORMIN HCl (JANUMET XR) [phone] MG TB24 Take 1 tablet by mouth every evening.     No current facility-administered medications for this visit.     REVIEW OF SYSTEMS:    A 10+ POINT REVIEW OF SYSTEMS WAS OBTAINED including neurology, dermatology, psychiatry, cardiac, respiratory, lymph, extremities, GI, GU, Musculoskeletal, constitutional, breasts, reproductive, HEENT.  All pertinent positives are noted in the HPI.  All others are negative.   PHYSICAL EXAMINATION:  Vitals:   01/02/19 1154  BP: 113/65  Pulse: 66  Resp: 17  Temp: 98.3 F (36.8 C)  SpO2: 99%   Filed Weights   01/02/19 1154  Weight: 180 lb 12.8 oz (82 kg)   .Body mass index is 22.6 kg/m.  GENERAL:alert, in no acute distress and comfortable SKIN: no acute rashes, no significant lesions EYES: conjunctiva are pink and non-injected, sclera anicteric OROPHARYNX: MMM, no exudates, no oropharyngeal erythema or ulceration NECK: supple, no JVD LYMPH:  no  palpable lymphadenopathy in the cervical, axillary or inguinal regions LUNGS: clear to auscultation b/l with normal respiratory effort HEART: regular rate & rhythm ABDOMEN:  normoactive bowel sounds , non tender, not distended. No palpable hepatosplenomegaly.  Extremity: no pedal edema PSYCH: alert & oriented x 3 with fluent speech NEURO: no focal motor/sensory deficits  LABORATORY DATA:  I have reviewed the data as listed  . CBC Latest Ref Rng & Units 01/02/2019 09/01/2018 06/01/2018  WBC 4.0 - 10.5 K/uL 7.5 6.9 6.0  Hemoglobin 13.0 - 17.0 g/dL 10.3(L) 10.1(L) 8.8(L)  Hematocrit 39.0 - 52.0 % 32.8(L) 32.4(L) 27.9(L)  Platelets 150 - 400 K/uL 150 141(L) 125(L)   . CBC    Component Value Date/Time   WBC 7.5 01/02/2019 1104   RBC 2.75 (L) 01/02/2019 1104   HGB 10.3 (L) 01/02/2019 1104   HGB 8.9 (L) 03/01/2018 1019   HCT 32.8 (L) 01/02/2019 1104   PLT 150 01/02/2019 1104   PLT 150 03/01/2018 1019   MCV 119.3 (H) 01/02/2019 1104   MCH 37.5 (H) 01/02/2019 1104   MCHC 31.4 01/02/2019 1104   RDW 18.7 (H) 01/02/2019 1104   LYMPHSABS 0.7 01/02/2019 1104   MONOABS 1.1 (H) 01/02/2019 1104   EOSABS 0.0 01/02/2019 1104   BASOSABS 0.0 01/02/2019 1104    . CMP Latest Ref Rng & Units 01/02/2019 09/01/2018 06/01/2018  Glucose 70 - 99 mg/dL 227(H) 189(H) 200(H)  BUN 8 - 23 mg/dL 25(H) 22 28(H)  Creatinine 0.61 - 1.24 mg/dL 1.22 1.23 1.29(H)  Sodium 135 - 145 mmol/L 140 140 143  Potassium 3.5 - 5.1 mmol/L 4.3 4.8 4.5  Chloride 98 - 111 mmol/L 105 106 107  CO2 22 - 32 mmol/L 27 23 26   Calcium 8.9 - 10.3 mg/dL 9.6 9.5 9.6  Total Protein 6.5 - 8.1 g/dL 7.1 7.2 7.1  Total Bilirubin 0.3 - 1.2 mg/dL 0.9 0.7 0.8  Alkaline Phos 38 - 126 U/L 53 60 74  AST 15 - 41 U/L 13(L) 15 14(L)  ALT 0 - 44 U/L 13 15 13    02/23/18 BM Bx:  02/23/18 BM Flow Cytometry:   02/23/18 Cytogenetics:    RADIOGRAPHIC STUDIES: I have personally reviewed the radiological images as listed and agreed with the  findings in the report. No results found.  ASSESSMENT & PLAN:   78 y.o. 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 No overt clinical evidence of acute bleeding.  Fecal occult blood test x1-.  Clinically no reported bleeding. Ferritin is in normal limits. B12 and folate within normal limits.  Normal RDW suggests against overt nutritional deficiency. TSH within normal limits Peripheral blood smear evidence of anisopoikilocytosis. Hgb has improved from 7.8 to 8.9 without transfusion.  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 Now resolved.  PLAN:  -Discussed pt labwork today, 01/02/19; Blood counts stable, blood chemistries steady, blood sugar is running high.  -Discussed 01/02/2019 LDH at 184 -Discussed 01/02/2019 Reticulocytes is as follows: Retic Ct Pct at 3.7, RBC at 2.73, Retic Count Abs at 99.6, Immature Retic Fract at 22.4 -Overall picture consistent with CMML, type 1 -Will continue watchful observation at this time, given clinical stability and lab stability. Will continue supportive therapies as needed. -Asked that if the pt develops concerns for infections, bleeding, or fatigue, that he let me know in the interim -Will see back in 4 months with labs    FOLLOW UP: RTC with Dr Irene Limbo with labs in 4 months  The total time spent in the appt was 15 minutes and more than 50% was on counseling and direct patient cares.  All of the patient's questions were answered with apparent satisfaction. The patient knows to call the clinic with any problems, questions or concerns.     Sullivan Lone MD [place] AAHIVMS [facility] Hematology/Oncology Physician [facility]  (Office):       [phone] (Work cell):  [phone] (Fax):           [phone]  01/02/2019 11:59 AM  I, Yevette Edwards, am acting as a scribe for Dr. Sullivan Lone.   .I have reviewed the above documentation for accuracy and completeness, and I agree with the above. Brunetta Genera MD

## 2019-01-02 ENCOUNTER — Inpatient Hospital Stay (HOSPITAL_BASED_OUTPATIENT_CLINIC_OR_DEPARTMENT_OTHER): Payer: Medicare Other | Admitting: Hematology

## 2019-01-02 ENCOUNTER — Other Ambulatory Visit: Payer: Self-pay

## 2019-01-02 ENCOUNTER — Inpatient Hospital Stay: Payer: Medicare Other | Attending: Hematology

## 2019-01-02 VITALS — BP 113/65 | HR 66 | Temp 98.3°F | Resp 17 | Ht 75.0 in | Wt 180.8 lb

## 2019-01-02 DIAGNOSIS — Z79899 Other long term (current) drug therapy: Secondary | ICD-10-CM | POA: Diagnosis not present

## 2019-01-02 DIAGNOSIS — D469 Myelodysplastic syndrome, unspecified: Secondary | ICD-10-CM

## 2019-01-02 DIAGNOSIS — D539 Nutritional anemia, unspecified: Secondary | ICD-10-CM | POA: Diagnosis not present

## 2019-01-02 DIAGNOSIS — D696 Thrombocytopenia, unspecified: Secondary | ICD-10-CM | POA: Diagnosis not present

## 2019-01-02 LAB — CBC WITH DIFFERENTIAL/PLATELET
Abs Immature Granulocytes: 0.02 10*3/uL (ref 0.00–0.07)
Basophils Absolute: 0 10*3/uL (ref 0.0–0.1)
Basophils Relative: 0 %
Eosinophils Absolute: 0 10*3/uL (ref 0.0–0.5)
Eosinophils Relative: 0 %
HCT: 32.8 % — ABNORMAL LOW (ref 39.0–52.0)
Hemoglobin: 10.3 g/dL — ABNORMAL LOW (ref 13.0–17.0)
Immature Granulocytes: 0 %
Lymphocytes Relative: 9 %
Lymphs Abs: 0.7 10*3/uL (ref 0.7–4.0)
MCH: 37.5 pg — ABNORMAL HIGH (ref 26.0–34.0)
MCHC: 31.4 g/dL (ref 30.0–36.0)
MCV: 119.3 fL — ABNORMAL HIGH (ref 80.0–100.0)
Monocytes Absolute: 1.1 10*3/uL — ABNORMAL HIGH (ref 0.1–1.0)
Monocytes Relative: 15 %
Neutro Abs: 5.7 10*3/uL (ref 1.7–7.7)
Neutrophils Relative %: 76 %
Platelets: 150 10*3/uL (ref 150–400)
RBC: 2.75 MIL/uL — ABNORMAL LOW (ref 4.22–5.81)
RDW: 18.7 % — ABNORMAL HIGH (ref 11.5–15.5)
WBC: 7.5 10*3/uL (ref 4.0–10.5)
nRBC: 0.3 % — ABNORMAL HIGH (ref 0.0–0.2)

## 2019-01-02 LAB — CMP (CANCER CENTER ONLY)
ALT: 13 U/L (ref 0–44)
AST: 13 U/L — ABNORMAL LOW (ref 15–41)
Albumin: 4.4 g/dL (ref 3.5–5.0)
Alkaline Phosphatase: 53 U/L (ref 38–126)
Anion gap: 8 (ref 5–15)
BUN: 25 mg/dL — ABNORMAL HIGH (ref 8–23)
CO2: 27 mmol/L (ref 22–32)
Calcium: 9.6 mg/dL (ref 8.9–10.3)
Chloride: 105 mmol/L (ref 98–111)
Creatinine: 1.22 mg/dL (ref 0.61–1.24)
GFR, Est AFR Am: >60 mL/min (ref >60)
GFR, Estimated: 56 mL/min — ABNORMAL LOW (ref >60)
Glucose, Bld: 227 mg/dL — ABNORMAL HIGH (ref 70–99)
Potassium: 4.3 mmol/L (ref 3.5–5.1)
Sodium: 140 mmol/L (ref 135–145)
Total Bilirubin: 0.9 mg/dL (ref 0.3–1.2)
Total Protein: 7.1 g/dL (ref 6.5–8.1)

## 2019-01-02 LAB — LACTATE DEHYDROGENASE: LDH: 184 U/L (ref 98–192)

## 2019-01-02 LAB — RETICULOCYTES
Immature Retic Fract: 22.4 % — ABNORMAL HIGH (ref 2.3–15.9)
RBC.: 2.73 MIL/uL — ABNORMAL LOW (ref 4.22–5.81)
Retic Count, Absolute: 99.6 10*3/uL (ref 19.0–186.0)
Retic Ct Pct: 3.7 % — ABNORMAL HIGH (ref 0.4–3.1)

## 2019-01-03 ENCOUNTER — Telehealth: Payer: Self-pay | Admitting: Hematology

## 2019-01-03 NOTE — Telephone Encounter (Signed)
Scheduled appt per 10/12 los. ° °Sent a staff message to get a calendar mailed out. °

## 2019-02-22 ENCOUNTER — Other Ambulatory Visit: Payer: Self-pay | Admitting: Cardiology

## 2019-03-03 ENCOUNTER — Encounter: Payer: Self-pay | Admitting: Cardiology

## 2019-03-03 ENCOUNTER — Ambulatory Visit: Payer: Medicare Other | Admitting: Cardiology

## 2019-03-03 ENCOUNTER — Other Ambulatory Visit: Payer: Self-pay

## 2019-03-03 VITALS — BP 111/65 | HR 68 | Ht 75.0 in | Wt 186.1 lb

## 2019-03-03 DIAGNOSIS — I25118 Atherosclerotic heart disease of native coronary artery with other forms of angina pectoris: Secondary | ICD-10-CM

## 2019-03-03 DIAGNOSIS — I10 Essential (primary) hypertension: Secondary | ICD-10-CM

## 2019-03-03 NOTE — Progress Notes (Signed)
Subjective:   Steve Maldonado, male    DOB: [birth year], 78 y.o.   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  He is under watcfhul observation plan per Dr. Irene Limbo for his CMML type 1. He is doing well and denies chest pain, shortness of breath, palpitations, leg edema, orthopnea, PND, TIA/syncope.    Past Medical History:  Diagnosis Date  . Hyperlipidemia   . Hypertension   . Stroke [facility]) 1970     Past Surgical History:  Procedure Laterality Date  . APPENDECTOMY  1980  . INTRAVASCULAR ULTRASOUND/IVUS N/A 12/10/2016   Procedure: Intravascular Ultrasound/IVUS;  Surgeon: Nigel Mormon, MD;  Location: [place] [facility];  Service: Cardiovascular;  Laterality: N/A;  . LEFT HEART CATH AND CORONARY ANGIOGRAPHY N/A 12/10/2016   Procedure: LEFT HEART CATH AND CORONARY ANGIOGRAPHY;  Surgeon: Nigel Mormon, MD;  Location: [place] [facility];  Service: Cardiovascular;  Laterality: N/A;     Social History   Socioeconomic History  . Marital status: Single    Spouse name: Not on file  . Number of children: Not on file  . Years of education: Not on file  . Highest education level: Not on file  Occupational History  . Not on file  Tobacco Use  . Smoking status: Former Smoker    Packs/day: 1.00    Years: 20.00    Pack years: 20.00    Types: Cigarettes    Quit date: 03/23/1978    Years since quitting: 40.9  . Smokeless tobacco: Never Used  Substance and Sexual Activity  . Alcohol use: Yes    Alcohol/week: 0.0 standard drinks    Comment: wine/has not had any in 1 year  . Drug use: No  . Sexual activity: Not on file  Other Topics Concern  . Not on file  Social History Narrative  . Not on file   Social Determinants of Health   Financial Resource Strain:   . Difficulty of Paying Living Expenses: Not on  file  Food Insecurity:   . Worried About Charity fundraiser in the Last Year: Not on file  . Ran Out of Food in the Last Year: Not on file  Transportation Needs:   . Lack of Transportation (Medical): Not on file  . Lack of Transportation (Non-Medical): Not on file  Physical Activity:   . Days of Exercise per Week: Not on file  . Minutes of Exercise per Session: Not on file  Stress:   . Feeling of Stress : Not on file  Social Connections:   . Frequency of Communication with Friends and Family: Not on file  . Frequency of Social Gatherings with Friends and Family: Not on file  . Attends Religious Services: Not on file  . Active Member of Clubs or Organizations: Not on file  . Attends Archivist Meetings: Not on file  . Marital Status: Not on file  Intimate Partner Violence:   . Fear of Current or Ex-Partner: Not on file  . Emotionally Abused: Not on file  . Physically Abused: Not on file  . Sexually Abused: Not on file     Family History  Problem Relation Age of Onset  . Colon cancer Neg Hx   . Esophageal cancer Neg Hx   . Rectal cancer Neg Hx   .  Stomach cancer Neg Hx      Current Outpatient Medications on File Prior to Visit  Medication Sig Dispense Refill  . aspirin EC 81 MG tablet Take 81 mg by mouth daily.    Marland Kitchen JARDIANCE 25 MG TABS tablet Take 25 mg by mouth at bedtime.    . metoprolol succinate (TOPROL-XL) 50 MG 24 hr tablet TAKE 1 TABLET BY MOUTH DAILY 90 tablet 2  . Multiple Vitamins-Minerals (CENTRUM SILVER PO) Take 1 tablet by mouth at bedtime.     . rosuvastatin (CRESTOR) 20 MG tablet TAKE 1 TABLET BY MOUTH EVERY DAY 30 tablet 3  . SitaGLIPtin-MetFORMIN HCl (JANUMET XR) [phone] MG TB24 Take 1 tablet by mouth every evening.     No current facility-administered medications on file prior to visit.    Cardiovascular studies:  EKG 03/03/2019: Sinus rhythm 65 bpm.  RBBB.   Coronary angiogram 12/10/2016: Nonobstructive calcific CAD by IVUS in LM  and mid LAD LCx/OM1 bifurcation 50/80% lesion   Recommend aggressive medical management at this time.   Echocardiogram 02/18/2018: - Left ventricle: The cavity size was normal. Wall thickness was   normal. Systolic function was normal. The estimated ejection   fraction was in the range of 50% to 55%. Wall motion was normal;   there were no regional wall motion abnormalities. Doppler   parameters are consistent with abnormal left ventricular   relaxation (grade 1 diastolic dysfunction). - Mitral valve: There was mild regurgitation. - Pulmonary arteries: Maldonado peak pressure: 32 mm Hg (S). - Pericardium, extracardiac: A trivial pericardial effusion was   identified.   Impressions:   - No significant change compared to previous outpatient   echocardiogram dated 11/19/2016.   Exercise myoview stress 11/20/2016: 1. The resting electrocardiogram demonstrated normal sinus rhythm, normal resting conduction and no resting arrhythmias.  Cannot exclude inferior infarct and anteerior infarct, old. The stress electrocardiogram was negative for ischemia. There were occasional PVC. Patient exercised on Bruce protocol for 5:00 minutes and achieved 6.06 METS. Stress test terminated due to dyspnea and 99% MPHR achieved (Target HR >85%).  2. 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  There is hypokinesis of the basal inferior, basal inferolateral, mid inferior, mid inferolateral, apical inferior and apical lateral myocardial wall(s).  The left ventricular ejection fraction was calculated or visually estimated to be 35%.  This is an high risk study.   Recent labs: 01/02/2019: Glucose 227, BUN/Cr 25/1.22. EGFR 56. Na/K 140/4.3. Rest of the CMP normal H/H 10.3/32.8. MCV 119. Platelets 150 Chol 120, TG 100, HDL 38, LDL 62 HbA1C 6.0%  Review of  Systems  Constitution: Negative for decreased appetite, malaise/fatigue, weight gain and weight loss.  HENT: Negative for congestion.   Eyes: Negative for visual disturbance.  Cardiovascular: Negative for chest pain, dyspnea on exertion, leg swelling, palpitations and syncope.  Respiratory: Negative for cough.   Endocrine: Negative for cold intolerance.  Hematologic/Lymphatic: Does not bruise/bleed easily.  Skin: Negative for itching and rash.  Musculoskeletal: Negative for myalgias.  Gastrointestinal: Negative for abdominal pain, nausea and vomiting.  Genitourinary: Negative for dysuria.  Neurological: Negative for dizziness and weakness.  Psychiatric/Behavioral: The patient is not nervous/anxious.   All other systems reviewed and are negative.        Vitals:   03/03/19 0831  BP: 111/65  Pulse: 68  SpO2: 96%     Body mass index  is 23.26 kg/m. Filed Weights   03/03/19 0831  Weight: 186 lb 1.6 oz (84.4 kg)     Objective:    Physical Exam  Constitutional: He is oriented to person, place, and time. He appears well-developed and well-nourished. No distress.  HENT:  Head: Normocephalic and atraumatic.  Eyes: Pupils are equal, round, and reactive to light. Conjunctivae are normal.  Neck: No JVD present.  Cardiovascular: Normal rate, regular rhythm and intact distal pulses.  No murmur heard. Pulmonary/Chest: Effort normal and breath sounds normal. He has no wheezes. He has no rales.  Abdominal: Soft. Bowel sounds are normal. There is no rebound.  Musculoskeletal:        General: No edema.  Lymphadenopathy:    He has no cervical adenopathy.  Neurological: He is alert and oriented to person, place, and time. No cranial nerve deficit.  Skin: Skin is warm and dry.  Psychiatric: He has a normal mood and affect.  Nursing note and vitals reviewed.         Assessment & Recommendations:   78 y/o Caucasian male with controlled hypertension, type 2 DM, moderate  nonobstructie CAD, hyperlipidemia, CKD stage 3, macrocytic anemia, ongoing workup for possible mylodysplastic neoplasm/syndrome.  CAD: Stable. Continue Aspirin, statin, metoprolol.   Hypertension, type 2 DM, hyperlipidemia: Controlled. Continue current medical therapy. Follow up w/Dr. Reynaldo Minium  I will see him back in 1 year.   Nigel Mormon, MD [facility] Cardiovascular. [place] Pager: [phone] Office: [phone] If no answer Cell [phone]

## 2019-05-05 ENCOUNTER — Other Ambulatory Visit: Payer: Self-pay

## 2019-05-05 ENCOUNTER — Inpatient Hospital Stay: Payer: Medicare PPO | Attending: Hematology

## 2019-05-05 ENCOUNTER — Inpatient Hospital Stay: Payer: Medicare PPO | Admitting: Hematology

## 2019-05-05 VITALS — BP 116/75 | HR 64 | Temp 97.8°F | Resp 18 | Ht 75.0 in | Wt 185.1 lb

## 2019-05-05 DIAGNOSIS — D469 Myelodysplastic syndrome, unspecified: Secondary | ICD-10-CM

## 2019-05-05 DIAGNOSIS — D539 Nutritional anemia, unspecified: Secondary | ICD-10-CM

## 2019-05-05 DIAGNOSIS — Z79899 Other long term (current) drug therapy: Secondary | ICD-10-CM | POA: Insufficient documentation

## 2019-05-05 DIAGNOSIS — D696 Thrombocytopenia, unspecified: Secondary | ICD-10-CM | POA: Insufficient documentation

## 2019-05-05 LAB — CMP (CANCER CENTER ONLY)
ALT: 19 U/L (ref 0–44)
AST: 16 U/L (ref 15–41)
Albumin: 4.8 g/dL (ref 3.5–5.0)
Alkaline Phosphatase: 74 U/L (ref 38–126)
Anion gap: 12 (ref 5–15)
BUN: 30 mg/dL — ABNORMAL HIGH (ref 8–23)
CO2: 25 mmol/L (ref 22–32)
Calcium: 9.9 mg/dL (ref 8.9–10.3)
Chloride: 102 mmol/L (ref 98–111)
Creatinine: 1.38 mg/dL — ABNORMAL HIGH (ref 0.61–1.24)
GFR, Est AFR Am: 56 mL/min — ABNORMAL LOW (ref >60)
GFR, Estimated: 48 mL/min — ABNORMAL LOW (ref >60)
Glucose, Bld: 231 mg/dL — ABNORMAL HIGH (ref 70–99)
Potassium: 4.5 mmol/L (ref 3.5–5.1)
Sodium: 139 mmol/L (ref 135–145)
Total Bilirubin: 0.9 mg/dL (ref 0.3–1.2)
Total Protein: 8 g/dL (ref 6.5–8.1)

## 2019-05-05 LAB — CBC WITH DIFFERENTIAL/PLATELET
Abs Immature Granulocytes: 0.03 10*3/uL (ref 0.00–0.07)
Basophils Absolute: 0 10*3/uL (ref 0.0–0.1)
Basophils Relative: 0 %
Eosinophils Absolute: 0 10*3/uL (ref 0.0–0.5)
Eosinophils Relative: 0 %
HCT: 36.3 % — ABNORMAL LOW (ref 39.0–52.0)
Hemoglobin: 11.3 g/dL — ABNORMAL LOW (ref 13.0–17.0)
Immature Granulocytes: 0 %
Lymphocytes Relative: 12 %
Lymphs Abs: 0.9 10*3/uL (ref 0.7–4.0)
MCH: 36.5 pg — ABNORMAL HIGH (ref 26.0–34.0)
MCHC: 31.1 g/dL (ref 30.0–36.0)
MCV: 117.1 fL — ABNORMAL HIGH (ref 80.0–100.0)
Monocytes Absolute: 1.2 10*3/uL — ABNORMAL HIGH (ref 0.1–1.0)
Monocytes Relative: 17 %
Neutro Abs: 5.1 10*3/uL (ref 1.7–7.7)
Neutrophils Relative %: 71 %
Platelets: 138 10*3/uL — ABNORMAL LOW (ref 150–400)
RBC: 3.1 MIL/uL — ABNORMAL LOW (ref 4.22–5.81)
RDW: 17.7 % — ABNORMAL HIGH (ref 11.5–15.5)
WBC: 7.3 10*3/uL (ref 4.0–10.5)
nRBC: 0.3 % — ABNORMAL HIGH (ref 0.0–0.2)

## 2019-05-05 LAB — VITAMIN B12: Vitamin B-12: 1237 pg/mL — ABNORMAL HIGH (ref 180–914)

## 2019-05-05 LAB — LACTATE DEHYDROGENASE: LDH: 245 U/L — ABNORMAL HIGH (ref 98–192)

## 2019-05-09 ENCOUNTER — Telehealth: Payer: Self-pay | Admitting: Hematology

## 2019-05-09 NOTE — Telephone Encounter (Signed)
Scheduled per 02/12 los, called patient and left a voicemail.

## 2019-05-11 NOTE — Progress Notes (Signed)
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 male who has been referred to [place] by Dr Cyndia Skeeters, Charlesetta Ivory, MD  for evaluation and management of macrocytic anemia and thrombocytopenia.  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  Patient was that he has been feeling well overall and was recently seen by Dr. Reynaldo Minium his primary care physician about 7 to 10 days prior to admission and is not aware of any abnormal labs at the time.  He does not recollect the exact blood counts at the time.  He does note that he had labs about 7 to 10 days prior to admission as well as 6 months prior to admission and there was no reported concerns on those.  Patient notes that he was cooking Thanksgiving lunch and developed lightheadedness and dizziness with presyncopal symptomatology.  He had to sit down on recliner and the dizziness still persisted for about 3 minutes and was associated with mild chest tightness resulting in a call to EMS who reportedly noted a blood pressure of 70 systolic. He reports that he had a had a good breakfast and was drinking enough water. He notes that prior to this episode he was feeling well and had no real new concerns or other symptoms.  In the emergency room his EKG showed sinus bradycardia with a heart rate of 58 bpm.  His blood pressure improved with fluid resuscitation.  Labs on admission -CBC showed a hemoglobin of 8.2 with an MCV of 122, RDW of 15, platelets of 114k, WBC counts within normal limits at 6.7k with normal differential.  He was noted to have some  mild monocytosis of 1.2k. 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  B12 within normal limits at 673, folate of 41.8, TSH within normal limits at 2.12. Patient denies any history of liver disease. Patient denies significant alcohol use. No other new focal symptomatology.  Absolute reticulocyte count of 61k with increase in immature reticulocyte fraction. Denies any overt bleeding.  Normal bowel movements no hematochezia or melena. LDH was within normal limits at 175 and bilirubin was within normal limits and suggest against overt hemolysis.  Repeat labs today show hemoglobin is at 7.8 with platelet counts of 104k Patient notes no acute symptomatology and no overt dizziness while in bed. No bone pains.  No abnormal bruising. No fevers no chills no night sweats no abnormal weight loss. No other revealing focal symptomatology.  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 The patient's last visit with [place] was in  10/2018.   The pt reports that he has been feeling good and maintaining a good energy level. He has continued to mow the grass and work around the house. Pt takes a multivitamin daily (Centrum Silver). He will see his PCP in December.   Lab results today reviewed with the patient.  On review of systems,no new fatigue, bleeding issues or infection. No chest pain no shortness of breath.  MEDICAL HISTORY:  Past Medical History:  Diagnosis Date  . Hyperlipidemia   . Hypertension   .  Stroke [facility]) 1970    SURGICAL HISTORY: Past Surgical History:  Procedure Laterality Date  . APPENDECTOMY  1980  . INTRAVASCULAR ULTRASOUND/IVUS N/A 12/10/2016   Procedure: Intravascular Ultrasound/IVUS;  Surgeon: Nigel Mormon, MD;  Location: [place] [facility];  Service: Cardiovascular;  Laterality: N/A;  . LEFT HEART CATH AND CORONARY ANGIOGRAPHY N/A 12/10/2016   Procedure: LEFT HEART  CATH AND CORONARY ANGIOGRAPHY;  Surgeon: Nigel Mormon, MD;  Location: [place] [facility];  Service: Cardiovascular;  Laterality: N/A;    SOCIAL HISTORY: Social History   Socioeconomic History  . Marital status: Single    Spouse name: Not on file  . Number of children: 0  . Years of education: Not on file  . Highest education level: Not on file  Occupational History  . Not on file  Tobacco Use  . Smoking status: Former Smoker    Packs/day: 1.00    Years: 20.00    Pack years: 20.00    Types: Cigarettes    Quit date: 03/23/1978    Years since quitting: 41.1  . Smokeless tobacco: Never Used  Substance and Sexual Activity  . Alcohol use: Yes    Alcohol/week: 0.0 standard drinks    Comment: wine/has not had any in 1 year  . Drug use: No  . Sexual activity: Not on file  Other Topics Concern  . Not on file  Social History Narrative  . Not on file   Social Determinants of Health   Financial Resource Strain:   . Difficulty of Paying Living Expenses: Not on file  Food Insecurity:   . Worried About Charity fundraiser in the Last Year: Not on file  . Ran Out of Food in the Last Year: Not on file  Transportation Needs:   . Lack of Transportation (Medical): Not on file  . Lack of Transportation (Non-Medical): Not on file  Physical Activity:   . Days of Exercise per Week: Not on file  . Minutes of Exercise per Session: Not on file  Stress:   . Feeling of Stress : Not on file  Social Connections:   . Frequency of Communication with Friends and Family: Not on file  . Frequency of Social Gatherings with Friends and Family: Not on file  . Attends Religious Services: Not on file  . Active Member of Clubs or Organizations: Not on file  . Attends Archivist Meetings: Not on file  . Marital Status: Not on file  Intimate Partner Violence:   . Fear of Current or Ex-Partner: Not on file  . Emotionally Abused: Not on file  . Physically Abused: Not on file  . Sexually  Abused: Not on file    FAMILY HISTORY: Family History  Problem Relation Age of Onset  . Colon cancer Neg Hx   . Esophageal cancer Neg Hx   . Rectal cancer Neg Hx   . Stomach cancer Neg Hx     ALLERGIES:  has No Known Allergies.  MEDICATIONS:  Current Outpatient Medications  Medication Sig Dispense Refill  . aspirin EC 81 MG tablet Take 81 mg by mouth daily.    Marland Kitchen JARDIANCE 25 MG TABS tablet Take 25 mg by mouth at bedtime.    . metoprolol succinate (TOPROL-XL) 50 MG 24 hr tablet TAKE 1 TABLET BY MOUTH DAILY 90 tablet 2  . Multiple Vitamins-Minerals (CENTRUM SILVER PO) Take 1 tablet by mouth at bedtime.     . rosuvastatin (CRESTOR) 20 MG tablet TAKE 1 TABLET  BY MOUTH EVERY DAY 30 tablet 3  . SitaGLIPtin-MetFORMIN HCl (JANUMET XR) [phone] MG TB24 Take 1 tablet by mouth every evening.     No current facility-administered medications for this visit.    REVIEW OF SYSTEMS:    A 10+ POINT REVIEW OF SYSTEMS WAS OBTAINED including neurology, dermatology, psychiatry, cardiac, respiratory, lymph, extremities, GI, GU, Musculoskeletal, constitutional, breasts, reproductive, HEENT.  All pertinent positives are noted in the HPI.  All others are negative.   PHYSICAL EXAMINATION:  Vitals:   05/05/19 1110  BP: 116/75  Pulse: 64  Resp: 18  Temp: 97.8 F (36.6 C)  SpO2: 99%   Filed Weights   05/05/19 1110  Weight: 185 lb 1.6 oz (84 kg)   .Body mass index is 23.14 kg/m. Marland Kitchen GENERAL:alert, in no acute distress and comfortable SKIN: no acute rashes, no significant lesions EYES: conjunctiva are pink and non-injected, sclera anicteric OROPHARYNX: MMM, no exudates, no oropharyngeal erythema or ulceration NECK: supple, no JVD LYMPH:  no palpable lymphadenopathy in the cervical, axillary or inguinal regions LUNGS: clear to auscultation b/l with normal respiratory effort HEART: regular rate & rhythm ABDOMEN:  normoactive bowel sounds , non tender, not distended. Extremity: no pedal edema  PSYCH: alert & oriented x 3 with fluent speech NEURO: no focal motor/sensory deficits  LABORATORY DATA:  I have reviewed the data as listed  . CBC Latest Ref Rng & Units 05/05/2019 01/02/2019 09/01/2018  WBC 4.0 - 10.5 K/uL 7.3 7.5 6.9  Hemoglobin 13.0 - 17.0 g/dL 11.3(L) 10.3(L) 10.1(L)  Hematocrit 39.0 - 52.0 % 36.3(L) 32.8(L) 32.4(L)  Platelets 150 - 400 K/uL 138(L) 150 141(L)   . CBC    Component Value Date/Time   WBC 7.3 05/05/2019 1024   RBC 3.10 (L) 05/05/2019 1024   HGB 11.3 (L) 05/05/2019 1024   HGB 8.9 (L) 03/01/2018 1019   HCT 36.3 (L) 05/05/2019 1024   PLT 138 (L) 05/05/2019 1024   PLT 150 03/01/2018 1019   MCV 117.1 (H) 05/05/2019 1024   MCH 36.5 (H) 05/05/2019 1024   MCHC 31.1 05/05/2019 1024   RDW 17.7 (H) 05/05/2019 1024   LYMPHSABS 0.9 05/05/2019 1024   MONOABS 1.2 (H) 05/05/2019 1024   EOSABS 0.0 05/05/2019 1024   BASOSABS 0.0 05/05/2019 1024    . CMP Latest Ref Rng & Units 05/05/2019 01/02/2019 09/01/2018  Glucose 70 - 99 mg/dL 231(H) 227(H) 189(H)  BUN 8 - 23 mg/dL 30(H) 25(H) 22  Creatinine 0.61 - 1.24 mg/dL 1.38(H) 1.22 1.23  Sodium 135 - 145 mmol/L 139 140 140  Potassium 3.5 - 5.1 mmol/L 4.5 4.3 4.8  Chloride 98 - 111 mmol/L 102 105 106  CO2 22 - 32 mmol/L 25 27 23   Calcium 8.9 - 10.3 mg/dL 9.9 9.6 9.5  Total Protein 6.5 - 8.1 g/dL 8.0 7.1 7.2  Total Bilirubin 0.3 - 1.2 mg/dL 0.9 0.9 0.7  Alkaline Phos 38 - 126 U/L 74 53 60  AST 15 - 41 U/L 16 13(L) 15  ALT 0 - 44 U/L 19 13 15    02/23/18 BM Bx:   02/23/18 BM Flow Cytometry:   02/23/18 Cytogenetics:    RADIOGRAPHIC STUDIES: I have personally reviewed the radiological images as listed and agreed with the findings in the report. No results found.  ASSESSMENT & PLAN:   79 y.o. 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 No overt  clinical evidence of acute bleeding.  Fecal occult blood test x1-.  Clinically no reported bleeding. Ferritin is in normal limits. B12 and folate within normal limits.  Normal RDW suggests against overt nutritional deficiency. TSH within normal limits Peripheral blood smear evidence of anisopoikilocytosis. Hgb has improved from 7.8 to 8.9 without transfusion.  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 Now resolved.  PLAN:  -Discussed pt labwork today, hgb has improved to 11.3. Other blood counts stable. --Will continue watchful observation at this time, given clinical stability and lab stability. Will continue supportive therapies as needed. -no indication for other rx at this time. -counseled pt to monitor for infections, bleeding, or fatigue, that he let me know in the interim -Will see back in 4 months with labs    FOLLOW UP: RTC with Dr Irene Limbo with labs in 4 months  The total time spent in the appt was 15 minutes and more than 50% was on counseling and direct patient cares.  All of the patient's questions were answered with apparent satisfaction. The patient knows to call the clinic with any problems, questions or concerns.    Sullivan Lone MD [place] AAHIVMS [facility] Hematology/Oncology Physician [facility]  (Office):       [phone] (Work cell):  [phone] (Fax):           [phone]

## 2019-05-23 ENCOUNTER — Other Ambulatory Visit: Payer: Self-pay | Admitting: Cardiology

## 2019-06-22 DIAGNOSIS — L819 Disorder of pigmentation, unspecified: Secondary | ICD-10-CM | POA: Diagnosis not present

## 2019-06-22 DIAGNOSIS — E1351 Other specified diabetes mellitus with diabetic peripheral angiopathy without gangrene: Secondary | ICD-10-CM | POA: Diagnosis not present

## 2019-06-22 DIAGNOSIS — M79671 Pain in right foot: Secondary | ICD-10-CM | POA: Diagnosis not present

## 2019-06-22 DIAGNOSIS — I739 Peripheral vascular disease, unspecified: Secondary | ICD-10-CM | POA: Diagnosis not present

## 2019-06-22 DIAGNOSIS — L84 Corns and callosities: Secondary | ICD-10-CM | POA: Diagnosis not present

## 2019-06-22 DIAGNOSIS — M79672 Pain in left foot: Secondary | ICD-10-CM | POA: Diagnosis not present

## 2019-06-22 DIAGNOSIS — M792 Neuralgia and neuritis, unspecified: Secondary | ICD-10-CM | POA: Diagnosis not present

## 2019-06-22 DIAGNOSIS — L602 Onychogryphosis: Secondary | ICD-10-CM | POA: Diagnosis not present

## 2019-07-13 DIAGNOSIS — I739 Peripheral vascular disease, unspecified: Secondary | ICD-10-CM | POA: Diagnosis not present

## 2019-07-17 DIAGNOSIS — E7849 Other hyperlipidemia: Secondary | ICD-10-CM | POA: Diagnosis not present

## 2019-07-17 DIAGNOSIS — N401 Enlarged prostate with lower urinary tract symptoms: Secondary | ICD-10-CM | POA: Diagnosis not present

## 2019-07-17 DIAGNOSIS — E1129 Type 2 diabetes mellitus with other diabetic kidney complication: Secondary | ICD-10-CM | POA: Diagnosis not present

## 2019-07-17 DIAGNOSIS — M1990 Unspecified osteoarthritis, unspecified site: Secondary | ICD-10-CM | POA: Diagnosis not present

## 2019-07-17 DIAGNOSIS — I10 Essential (primary) hypertension: Secondary | ICD-10-CM | POA: Diagnosis not present

## 2019-07-17 DIAGNOSIS — N183 Chronic kidney disease, stage 3 unspecified: Secondary | ICD-10-CM | POA: Diagnosis not present

## 2019-07-17 DIAGNOSIS — D696 Thrombocytopenia, unspecified: Secondary | ICD-10-CM | POA: Diagnosis not present

## 2019-07-17 DIAGNOSIS — I25118 Atherosclerotic heart disease of native coronary artery with other forms of angina pectoris: Secondary | ICD-10-CM | POA: Diagnosis not present

## 2019-07-27 DIAGNOSIS — M792 Neuralgia and neuritis, unspecified: Secondary | ICD-10-CM | POA: Diagnosis not present

## 2019-07-27 DIAGNOSIS — M79671 Pain in right foot: Secondary | ICD-10-CM | POA: Diagnosis not present

## 2019-07-27 DIAGNOSIS — I739 Peripheral vascular disease, unspecified: Secondary | ICD-10-CM | POA: Diagnosis not present

## 2019-07-27 DIAGNOSIS — M79672 Pain in left foot: Secondary | ICD-10-CM | POA: Diagnosis not present

## 2019-09-04 ENCOUNTER — Inpatient Hospital Stay: Payer: Medicare PPO | Admitting: Hematology

## 2019-09-04 ENCOUNTER — Other Ambulatory Visit: Payer: Self-pay

## 2019-09-04 ENCOUNTER — Inpatient Hospital Stay: Payer: Medicare PPO | Attending: Hematology

## 2019-09-04 VITALS — BP 120/76 | HR 70 | Temp 97.7°F | Resp 17 | Ht 75.0 in | Wt 182.9 lb

## 2019-09-04 DIAGNOSIS — C946 Myelodysplastic disease, not classified: Secondary | ICD-10-CM

## 2019-09-04 DIAGNOSIS — Z79899 Other long term (current) drug therapy: Secondary | ICD-10-CM | POA: Insufficient documentation

## 2019-09-04 DIAGNOSIS — Z87891 Personal history of nicotine dependence: Secondary | ICD-10-CM | POA: Insufficient documentation

## 2019-09-04 DIAGNOSIS — D539 Nutritional anemia, unspecified: Secondary | ICD-10-CM | POA: Diagnosis not present

## 2019-09-04 DIAGNOSIS — D469 Myelodysplastic syndrome, unspecified: Secondary | ICD-10-CM

## 2019-09-04 DIAGNOSIS — I10 Essential (primary) hypertension: Secondary | ICD-10-CM | POA: Diagnosis not present

## 2019-09-04 DIAGNOSIS — D696 Thrombocytopenia, unspecified: Secondary | ICD-10-CM

## 2019-09-04 DIAGNOSIS — Z7982 Long term (current) use of aspirin: Secondary | ICD-10-CM | POA: Diagnosis not present

## 2019-09-04 LAB — CBC WITH DIFFERENTIAL/PLATELET
Abs Immature Granulocytes: 0.04 10*3/uL (ref 0.00–0.07)
Basophils Absolute: 0 10*3/uL (ref 0.0–0.1)
Basophils Relative: 0 %
Eosinophils Absolute: 0 10*3/uL (ref 0.0–0.5)
Eosinophils Relative: 0 %
HCT: 32.9 % — ABNORMAL LOW (ref 39.0–52.0)
Hemoglobin: 10.3 g/dL — ABNORMAL LOW (ref 13.0–17.0)
Immature Granulocytes: 1 %
Lymphocytes Relative: 9 %
Lymphs Abs: 0.6 10*3/uL — ABNORMAL LOW (ref 0.7–4.0)
MCH: 36.5 pg — ABNORMAL HIGH (ref 26.0–34.0)
MCHC: 31.3 g/dL (ref 30.0–36.0)
MCV: 116.7 fL — ABNORMAL HIGH (ref 80.0–100.0)
Monocytes Absolute: 0.8 10*3/uL (ref 0.1–1.0)
Monocytes Relative: 12 %
Neutro Abs: 5.3 10*3/uL (ref 1.7–7.7)
Neutrophils Relative %: 78 %
Platelets: 130 10*3/uL — ABNORMAL LOW (ref 150–400)
RBC: 2.82 MIL/uL — ABNORMAL LOW (ref 4.22–5.81)
RDW: 17.9 % — ABNORMAL HIGH (ref 11.5–15.5)
WBC: 6.7 10*3/uL (ref 4.0–10.5)
nRBC: 0.3 % — ABNORMAL HIGH (ref 0.0–0.2)

## 2019-09-04 LAB — CMP (CANCER CENTER ONLY)
ALT: 15 U/L (ref 0–44)
AST: 10 U/L — ABNORMAL LOW (ref 15–41)
Albumin: 4.2 g/dL (ref 3.5–5.0)
Alkaline Phosphatase: 71 U/L (ref 38–126)
Anion gap: 12 (ref 5–15)
BUN: 28 mg/dL — ABNORMAL HIGH (ref 8–23)
CO2: 23 mmol/L (ref 22–32)
Calcium: 9.8 mg/dL (ref 8.9–10.3)
Chloride: 103 mmol/L (ref 98–111)
Creatinine: 1.39 mg/dL — ABNORMAL HIGH (ref 0.61–1.24)
GFR, Est AFR Am: 55 mL/min — ABNORMAL LOW (ref >60)
GFR, Estimated: 48 mL/min — ABNORMAL LOW (ref >60)
Glucose, Bld: 381 mg/dL — ABNORMAL HIGH (ref 70–99)
Potassium: 4.7 mmol/L (ref 3.5–5.1)
Sodium: 138 mmol/L (ref 135–145)
Total Bilirubin: 1 mg/dL (ref 0.3–1.2)
Total Protein: 6.9 g/dL (ref 6.5–8.1)

## 2019-09-04 LAB — LACTATE DEHYDROGENASE: LDH: 174 U/L (ref 98–192)

## 2019-09-04 NOTE — Progress Notes (Signed)
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 male who has been referred to [place] by Dr Cyndia Skeeters, Charlesetta Ivory, MD  for evaluation and management of macrocytic anemia and thrombocytopenia.  Patient is an overall healthy 79 year old male, retired [employer], with a history of hypertension, dyslipidemia, CVA with resolved right-sided weakness at age 89 of unclear etiology.  Patient was that he has been feeling well overall and was recently seen by Dr. Reynaldo Minium his primary care physician about 7 to 10 days prior to admission and is not aware of any abnormal labs at the time.  He does not recollect the exact blood counts at the time.  He does note that he had labs about 7 to 10 days prior to admission as well as 6 months prior to admission and there was no reported concerns on those.  Patient notes that he was cooking Thanksgiving lunch and developed lightheadedness and dizziness with presyncopal symptomatology.  He had to sit down on recliner and the dizziness still persisted for about 3 minutes and was associated with mild chest tightness resulting in a call to EMS who reportedly noted a blood pressure of 70 systolic. He reports that he had a had a good breakfast and was drinking enough water. He notes that prior to this episode he was feeling well and had no real new concerns or other symptoms.  In the emergency room his EKG showed sinus bradycardia with a heart rate of 58 bpm.  His blood pressure improved with fluid resuscitation.  Labs on admission -CBC showed a hemoglobin of 8.2 with an MCV of 122, RDW of 15, platelets of 114k, WBC counts within normal limits at 6.7k with normal differential.  He was noted to have some  mild monocytosis of 1.2k. 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  B12 within normal limits at 673, folate of 41.8, TSH within normal limits at 2.12. Patient denies any history of liver disease. Patient denies significant alcohol use. No other new focal symptomatology.  Absolute reticulocyte count of 61k with increase in immature reticulocyte fraction. Denies any overt bleeding.  Normal bowel movements no hematochezia or melena. LDH was within normal limits at 175 and bilirubin was within normal limits and suggest against overt hemolysis.  Repeat labs today show hemoglobin is at 7.8 with platelet counts of 104k Patient notes no acute symptomatology and no overt dizziness while in bed. No bone pains.  No abnormal bruising. No fevers no chills no night sweats no abnormal weight loss. No other revealing focal symptomatology.  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 The patient's last visit with [place] was on 05/05/2019. The pt reports that he is doing well overall.  The pt reports that he has been experiencing fatigue and weakness in his legs. Pt has been drinking a lot of water and is urinating frequently. He has also noticed some weight loss over the last 6 months. He has received both of his COVID19 vaccines.   Lab results today (09/04/19) of CBC w/diff and CMP is as follows: all values are WNL except for RBC at 2.82, Hgb at 10.3, HCT at 32.9, MCV at 116.7, [facility]  at 36.5, RDW at 17.9, PLT at 130K, nRBC at 0.3, Lymphs Abs at 0.6K, Glucose at 381, BUN at 28, Creatinine at 1.39, AST at 10, GFR Est Non Af Am at 48. 09/04/2019 LDH at 174  On review of systems, pt reports LE weakness, fatigue, weight loss, polyuria and denies abdominal pain, leg swelling and any other symptoms.   MEDICAL HISTORY:  Past Medical History:  Diagnosis Date  . Hyperlipidemia   . Hypertension   . Stroke [facility])  1970    SURGICAL HISTORY: Past Surgical History:  Procedure Laterality Date  . APPENDECTOMY  1980  . INTRAVASCULAR ULTRASOUND/IVUS N/A 12/10/2016   Procedure: Intravascular Ultrasound/IVUS;  Surgeon: Nigel Mormon, MD;  Location: [place] [facility];  Service: Cardiovascular;  Laterality: N/A;  . LEFT HEART CATH AND CORONARY ANGIOGRAPHY N/A 12/10/2016   Procedure: LEFT HEART CATH AND CORONARY ANGIOGRAPHY;  Surgeon: Nigel Mormon, MD;  Location: [place] [facility];  Service: Cardiovascular;  Laterality: N/A;    SOCIAL HISTORY: Social History   Socioeconomic History  . Marital status: Single    Spouse name: Not on file  . Number of children: 0  . Years of education: Not on file  . Highest education level: Not on file  Occupational History  . Not on file  Tobacco Use  . Smoking status: Former Smoker    Packs/day: 1.00    Years: 20.00    Pack years: 20.00    Types: Cigarettes    Quit date: 03/23/1978    Years since quitting: 41.4  . Smokeless tobacco: Never Used  Substance and Sexual Activity  . Alcohol use: Yes    Alcohol/week: 0.0 standard drinks    Comment: wine/has not had any in 1 year  . Drug use: No  . Sexual activity: Not on file  Other Topics Concern  . Not on file  Social History Narrative  . Not on file   Social Determinants of Health   Financial Resource Strain:   . Difficulty of Paying Living Expenses:   Food Insecurity:   . Worried About Charity fundraiser in the Last Year:   . Arboriculturist in the Last Year:   Transportation Needs:   . Film/video editor (Medical):   Marland Kitchen Lack of Transportation (Non-Medical):   Physical Activity:   . Days of Exercise per Week:   . Minutes of Exercise per Session:   Stress:   . Feeling of Stress :   Social Connections:   . Frequency of Communication with Friends and Family:   . Frequency of Social Gatherings with Friends and Family:   . Attends Religious Services:   . Active Member of Clubs or  Organizations:   . Attends Archivist Meetings:   Marland Kitchen Marital Status:   Intimate Partner Violence:   . Fear of Current or Ex-Partner:   . Emotionally Abused:   Marland Kitchen Physically Abused:   . Sexually Abused:     FAMILY HISTORY: Family History  Problem Relation Age of Onset  . Colon cancer Neg Hx   . Esophageal cancer Neg Hx   . Rectal cancer Neg Hx   . Stomach cancer Neg Hx     ALLERGIES:  has No Known Allergies.  MEDICATIONS:  Current Outpatient Medications  Medication Sig Dispense Refill  . aspirin EC 81 MG tablet Take 81 mg by mouth daily.    Marland Kitchen JARDIANCE 25 MG TABS tablet Take 25 mg by mouth at bedtime.    Marland Kitchen  metoprolol succinate (TOPROL-XL) 50 MG 24 hr tablet TAKE 1 TABLET BY MOUTH DAILY 90 tablet 2  . Multiple Vitamins-Minerals (CENTRUM SILVER PO) Take 1 tablet by mouth at bedtime.     . rosuvastatin (CRESTOR) 20 MG tablet TAKE 1 TABLET BY MOUTH DAILY 90 tablet 2  . SitaGLIPtin-MetFORMIN HCl (JANUMET XR) [phone] MG TB24 Take 1 tablet by mouth every evening.     No current facility-administered medications for this visit.    REVIEW OF SYSTEMS:   A 10+ POINT REVIEW OF SYSTEMS WAS OBTAINED including neurology, dermatology, psychiatry, cardiac, respiratory, lymph, extremities, GI, GU, Musculoskeletal, constitutional, breasts, reproductive, HEENT.  All pertinent positives are noted in the HPI.  All others are negative.   PHYSICAL EXAMINATION:  Vitals:   09/04/19 1122  BP: 120/76  Pulse: 70  Resp: 17  Temp: 97.7 F (36.5 C)  SpO2: 96%   Filed Weights   09/04/19 1122  Weight: 182 lb 14.4 oz (83 kg)   .Body mass index is 22.86 kg/m.   GENERAL:alert, in no acute distress and comfortable SKIN: no acute rashes, no significant lesions EYES: conjunctiva are pink and non-injected, sclera anicteric OROPHARYNX: MMM, no exudates, no oropharyngeal erythema or ulceration NECK: supple, no JVD LYMPH:  no palpable lymphadenopathy in the cervical, axillary or inguinal  regions LUNGS: clear to auscultation b/l with normal respiratory effort HEART: regular rate & rhythm ABDOMEN:  normoactive bowel sounds , non tender, not distended. No palpable hepatosplenomegaly.  Extremity: no pedal edema PSYCH: alert & oriented x 3 with fluent speech NEURO: no focal motor/sensory deficits  LABORATORY DATA:  I have reviewed the data as listed  . CBC Latest Ref Rng & Units 09/04/2019 05/05/2019 01/02/2019  WBC 4.0 - 10.5 K/uL 6.7 7.3 7.5  Hemoglobin 13.0 - 17.0 g/dL 10.3(L) 11.3(L) 10.3(L)  Hematocrit 39 - 52 % 32.9(L) 36.3(L) 32.8(L)  Platelets 150 - 400 K/uL 130(L) 138(L) 150   . CBC    Component Value Date/Time   WBC 6.7 09/04/2019 1050   RBC 2.82 (L) 09/04/2019 1050   HGB 10.3 (L) 09/04/2019 1050   HGB 8.9 (L) 03/01/2018 1019   HCT 32.9 (L) 09/04/2019 1050   PLT 130 (L) 09/04/2019 1050   PLT 150 03/01/2018 1019   MCV 116.7 (H) 09/04/2019 1050   MCH 36.5 (H) 09/04/2019 1050   MCHC 31.3 09/04/2019 1050   RDW 17.9 (H) 09/04/2019 1050   LYMPHSABS 0.6 (L) 09/04/2019 1050   MONOABS 0.8 09/04/2019 1050   EOSABS 0.0 09/04/2019 1050   BASOSABS 0.0 09/04/2019 1050    . CMP Latest Ref Rng & Units 09/04/2019 05/05/2019 01/02/2019  Glucose 70 - 99 mg/dL 381(H) 231(H) 227(H)  BUN 8 - 23 mg/dL 28(H) 30(H) 25(H)  Creatinine 0.61 - 1.24 mg/dL 1.39(H) 1.38(H) 1.22  Sodium 135 - 145 mmol/L 138 139 140  Potassium 3.5 - 5.1 mmol/L 4.7 4.5 4.3  Chloride 98 - 111 mmol/L 103 102 105  CO2 22 - 32 mmol/L 23 25 27   Calcium 8.9 - 10.3 mg/dL 9.8 9.9 9.6  Total Protein 6.5 - 8.1 g/dL 6.9 8.0 7.1  Total Bilirubin 0.3 - 1.2 mg/dL 1.0 0.9 0.9  Alkaline Phos 38 - 126 U/L 71 74 53  AST 15 - 41 U/L 10(L) 16 13(L)  ALT 0 - 44 U/L 15 19 13    02/23/18 BM Bx:   02/23/18 BM Flow Cytometry:   02/23/18 Cytogenetics:    RADIOGRAPHIC STUDIES: I have personally reviewed the radiological images as listed and  agreed with the findings in the report. No results found.  ASSESSMENT &  PLAN:   79 y.o. 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 No overt clinical evidence of acute bleeding.  Fecal occult blood test x1-.  Clinically no reported bleeding. Ferritin is in normal limits. B12 and folate within normal limits.  Normal RDW suggests against overt nutritional deficiency. TSH within normal limits Peripheral blood smear evidence of anisopoikilocytosis. Hgb has improved from 7.8 to 8.9 without transfusion.  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 Now resolved.  PLAN:  -Discussed pt labwork today, 09/04/19; Hgb is steady, WBC is nml, PLT a little low, Monocytes have normalized, Glucose is significantly elevated at 381, kidney function is stable, LDH is WNL -No evidence of MDS transformation to AML. Pt has not needed transfusion support or medication so far.  -Will continue watchful observation at this time, given clinical and lab stability. Will begin supportive therapies as needed.  -Advised pt that Metformin and uncontrolled glucose levels could be contributing to weight loss, dehydration, and fatigue -Recommend pt monitor blood glucose levels at home -Recommend pt connect with PCP ASAP for DM II management -Will see back in 4 months with labs   FOLLOW UP: RTC with Dr Irene Limbo with labs in 4 months   The total time spent in the appt was 20 minutes and more than 50% was on counseling and direct patient cares.  All of the patient's questions were answered with apparent satisfaction. The patient knows to call the clinic with any problems, questions or  concerns.   Sullivan Lone MD [place] AAHIVMS [facility] Hematology/Oncology Physician [facility]  (Office):       [phone] (Work cell):  [phone] (Fax):           [phone]  I, Yevette Edwards, am acting as a scribe for Dr. Sullivan Lone.   .I have reviewed the above documentation for accuracy and completeness, and I agree with the above. Brunetta Genera MD

## 2019-10-20 DIAGNOSIS — R350 Frequency of micturition: Secondary | ICD-10-CM | POA: Diagnosis not present

## 2019-10-20 DIAGNOSIS — R3915 Urgency of urination: Secondary | ICD-10-CM | POA: Diagnosis not present

## 2019-10-20 DIAGNOSIS — R3914 Feeling of incomplete bladder emptying: Secondary | ICD-10-CM | POA: Diagnosis not present

## 2019-10-20 DIAGNOSIS — N401 Enlarged prostate with lower urinary tract symptoms: Secondary | ICD-10-CM | POA: Diagnosis not present

## 2019-10-25 DIAGNOSIS — D1801 Hemangioma of skin and subcutaneous tissue: Secondary | ICD-10-CM | POA: Diagnosis not present

## 2019-10-25 DIAGNOSIS — L821 Other seborrheic keratosis: Secondary | ICD-10-CM | POA: Diagnosis not present

## 2019-10-25 DIAGNOSIS — L565 Disseminated superficial actinic porokeratosis (DSAP): Secondary | ICD-10-CM | POA: Diagnosis not present

## 2019-10-25 DIAGNOSIS — L570 Actinic keratosis: Secondary | ICD-10-CM | POA: Diagnosis not present

## 2019-10-25 DIAGNOSIS — D225 Melanocytic nevi of trunk: Secondary | ICD-10-CM | POA: Diagnosis not present

## 2019-10-25 DIAGNOSIS — Z85820 Personal history of malignant melanoma of skin: Secondary | ICD-10-CM | POA: Diagnosis not present

## 2019-10-25 DIAGNOSIS — D2261 Melanocytic nevi of right upper limb, including shoulder: Secondary | ICD-10-CM | POA: Diagnosis not present

## 2019-10-25 DIAGNOSIS — Z85828 Personal history of other malignant neoplasm of skin: Secondary | ICD-10-CM | POA: Diagnosis not present

## 2019-10-25 DIAGNOSIS — L814 Other melanin hyperpigmentation: Secondary | ICD-10-CM | POA: Diagnosis not present

## 2019-11-15 DIAGNOSIS — R3915 Urgency of urination: Secondary | ICD-10-CM | POA: Diagnosis not present

## 2019-11-15 DIAGNOSIS — R3914 Feeling of incomplete bladder emptying: Secondary | ICD-10-CM | POA: Diagnosis not present

## 2019-11-15 DIAGNOSIS — R350 Frequency of micturition: Secondary | ICD-10-CM | POA: Diagnosis not present

## 2019-11-16 ENCOUNTER — Other Ambulatory Visit: Payer: Self-pay | Admitting: Cardiology

## 2019-12-02 IMAGING — CR DG CHEST 2V
2 series · 2 of 2 positions shown · non-contrast
Comparison: 06/08/2013 chest radiograph.

CLINICAL DATA: Syncope

EXAM:
CHEST - 2 VIEW

[chest pa]
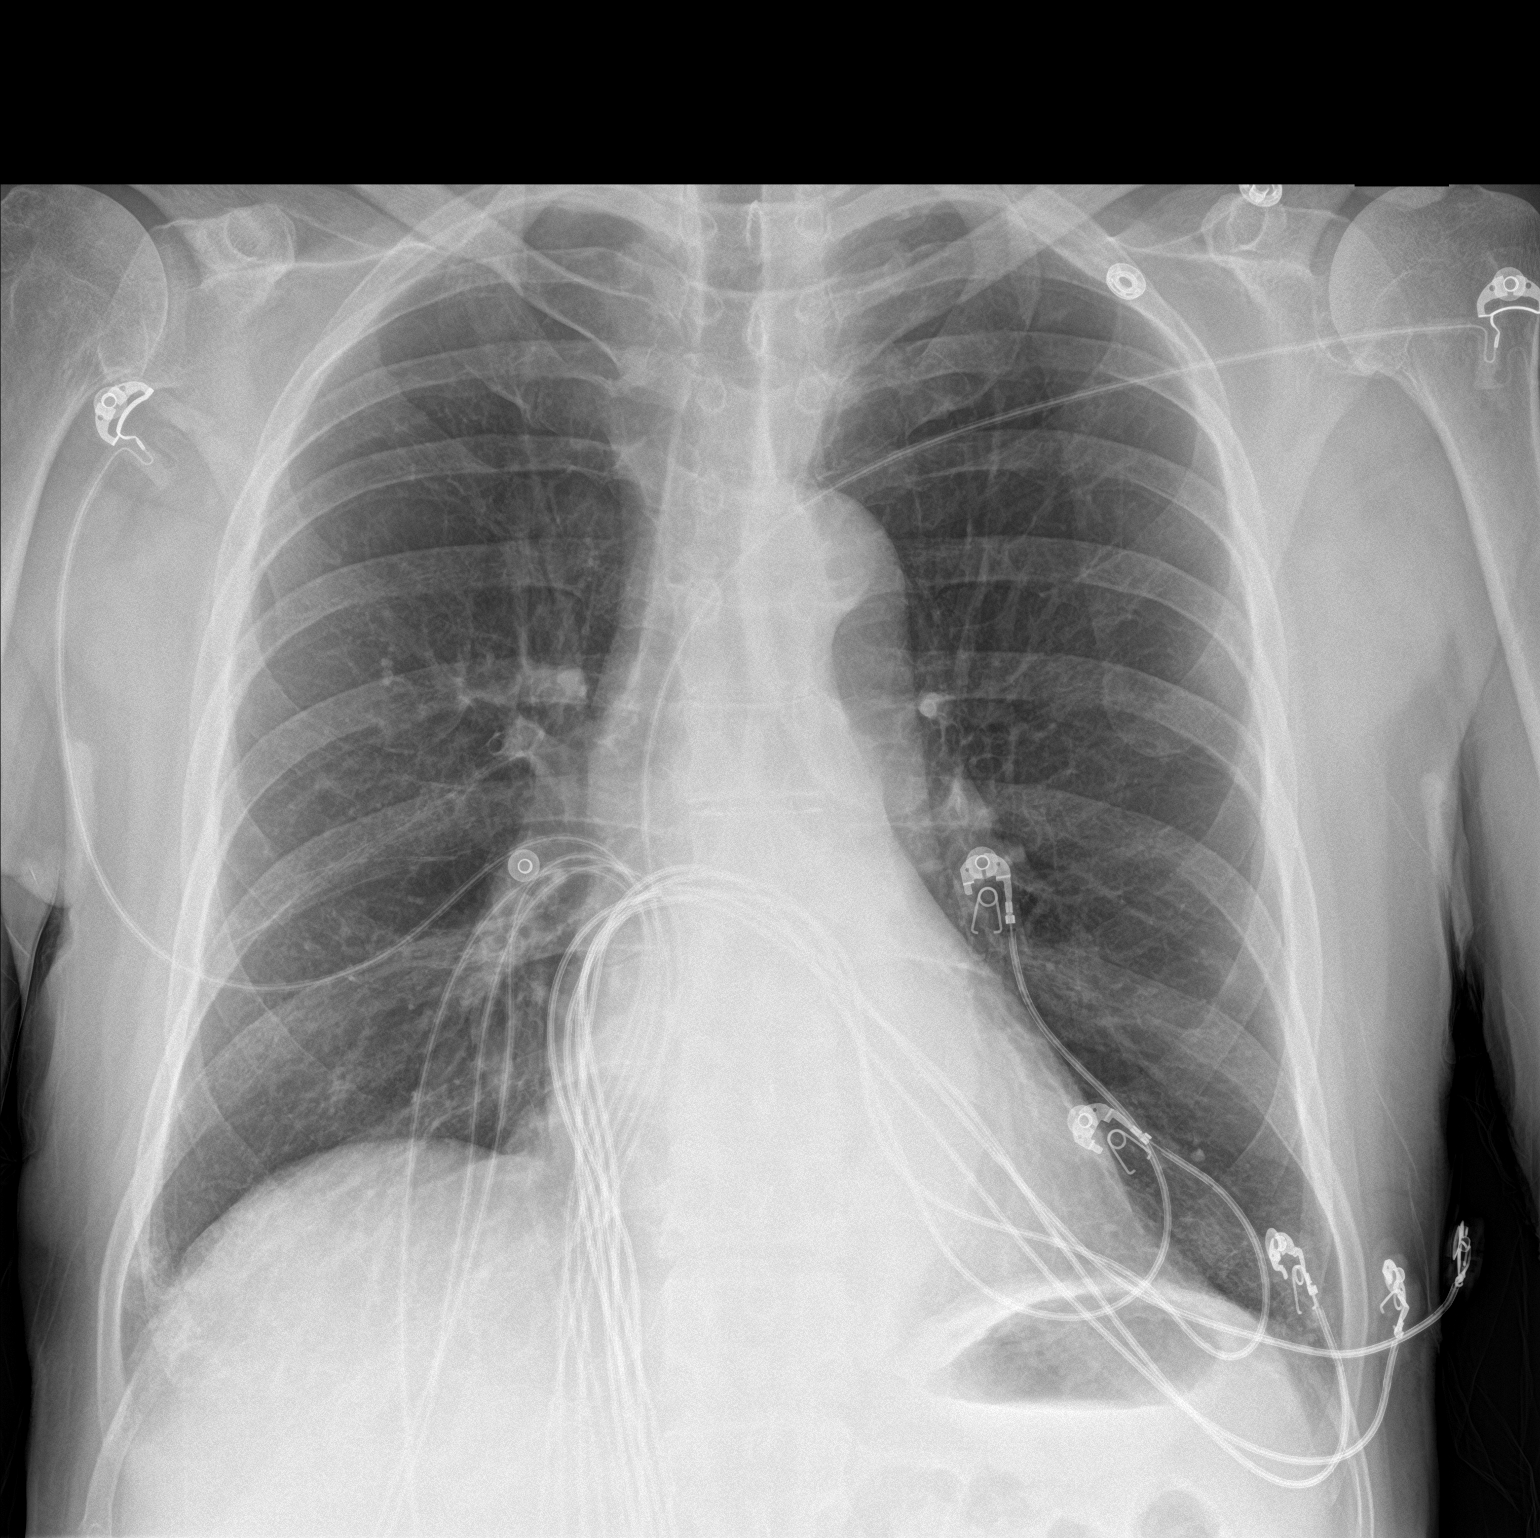

[chest lat]
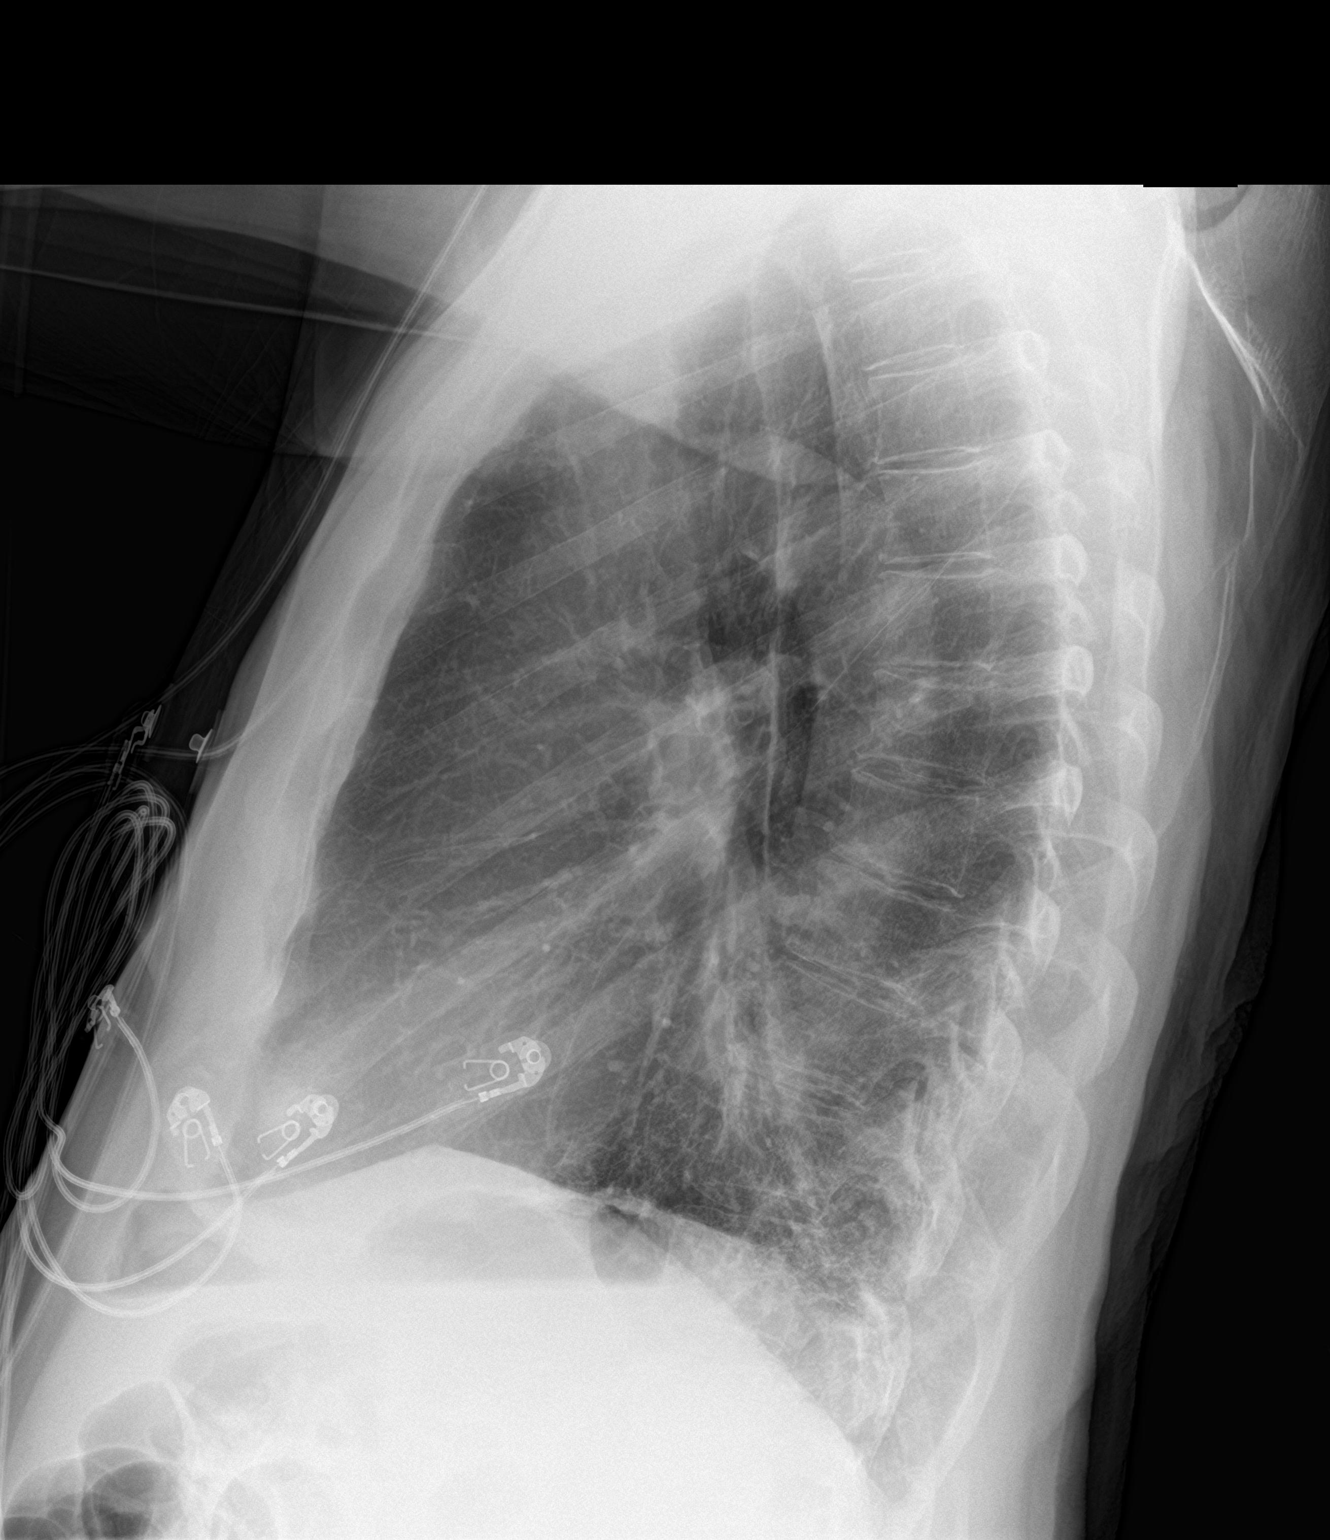

[2 of 2 positions shown; findings below may reference images not displayed]

FINDINGS: Stable cardiomediastinal silhouette with normal heart size. No
pneumothorax. No pleural effusion. No pulmonary edema. Mild
reticular opacities at the right greater than left lung bases,
slightly increased. No acute consolidative airspace disease.
IMPRESSION: Mild bibasilar reticular opacities, right greater than left,
slightly increased, suggestive of nonspecific mild fibrosis, cannot
exclude interstitial lung disease. No acute cardiopulmonary disease.

## 2019-12-04 DIAGNOSIS — E785 Hyperlipidemia, unspecified: Secondary | ICD-10-CM | POA: Diagnosis not present

## 2019-12-04 DIAGNOSIS — D0359 Melanoma in situ of other part of trunk: Secondary | ICD-10-CM | POA: Diagnosis not present

## 2019-12-04 DIAGNOSIS — N1832 Chronic kidney disease, stage 3b: Secondary | ICD-10-CM | POA: Diagnosis not present

## 2019-12-04 DIAGNOSIS — I129 Hypertensive chronic kidney disease with stage 1 through stage 4 chronic kidney disease, or unspecified chronic kidney disease: Secondary | ICD-10-CM | POA: Diagnosis not present

## 2019-12-04 DIAGNOSIS — E1129 Type 2 diabetes mellitus with other diabetic kidney complication: Secondary | ICD-10-CM | POA: Diagnosis not present

## 2019-12-04 DIAGNOSIS — M1990 Unspecified osteoarthritis, unspecified site: Secondary | ICD-10-CM | POA: Diagnosis not present

## 2019-12-04 DIAGNOSIS — I25118 Atherosclerotic heart disease of native coronary artery with other forms of angina pectoris: Secondary | ICD-10-CM | POA: Diagnosis not present

## 2019-12-05 DIAGNOSIS — N183 Chronic kidney disease, stage 3 unspecified: Secondary | ICD-10-CM | POA: Diagnosis not present

## 2019-12-05 DIAGNOSIS — E1129 Type 2 diabetes mellitus with other diabetic kidney complication: Secondary | ICD-10-CM | POA: Diagnosis not present

## 2019-12-05 DIAGNOSIS — I129 Hypertensive chronic kidney disease with stage 1 through stage 4 chronic kidney disease, or unspecified chronic kidney disease: Secondary | ICD-10-CM | POA: Diagnosis not present

## 2019-12-08 IMAGING — CT CT BIOPSY
1 of 2 series · 15 of 28 positions shown, 19 images · non-contrast
Comparison: none

INDICATION: Anemia, thrombocytopenia

[Series 3: i-spiral 5.0 b40f · axial · 0.98mm/px · z∈[-56,+24]mm · 15 of 27 slices shown, 19 images]
[im 2/27  mediastinal]
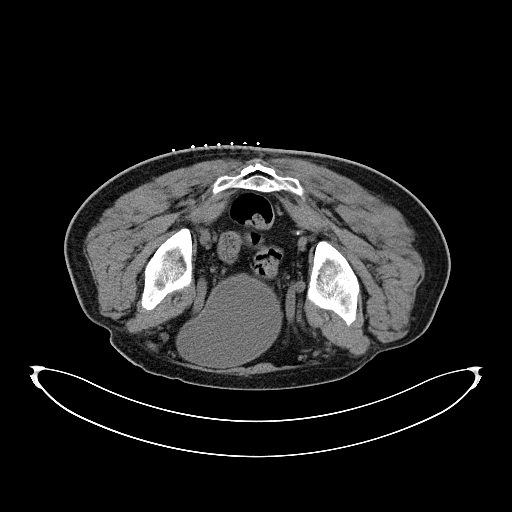
[im 2/27  lung]
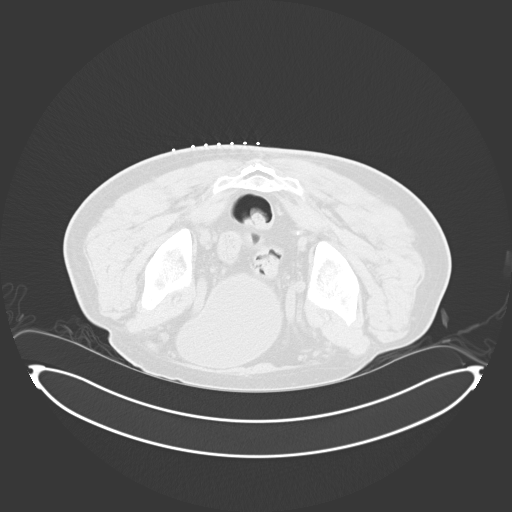
[im 4/27  lung]
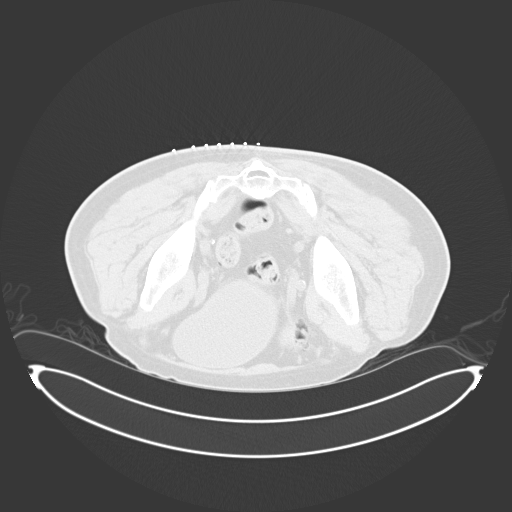
[im 5/27  lung]
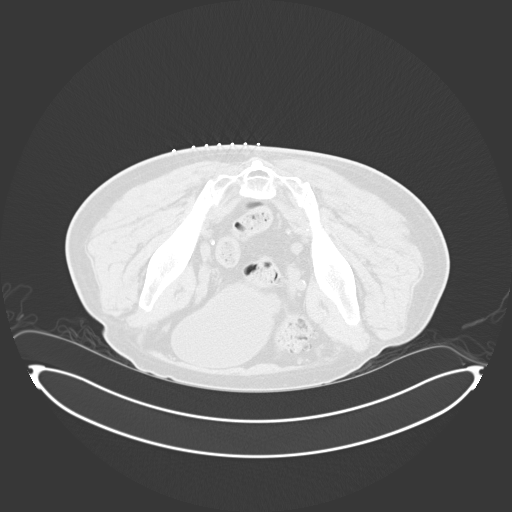
[im 7/27  lung]
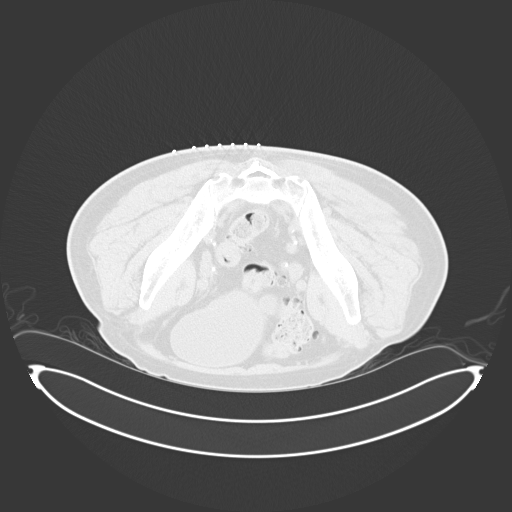
[im 8/27  mediastinal]
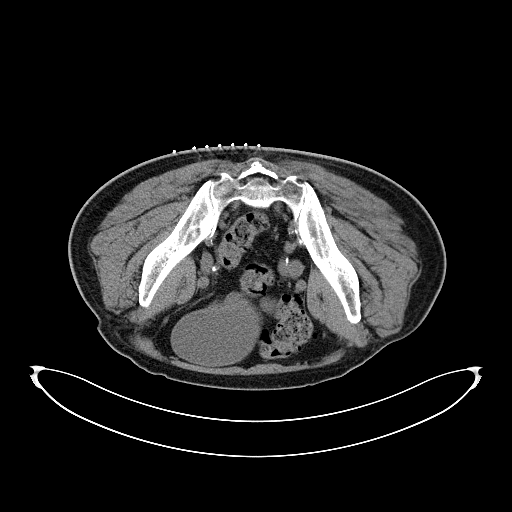
[im 8/27  lung]
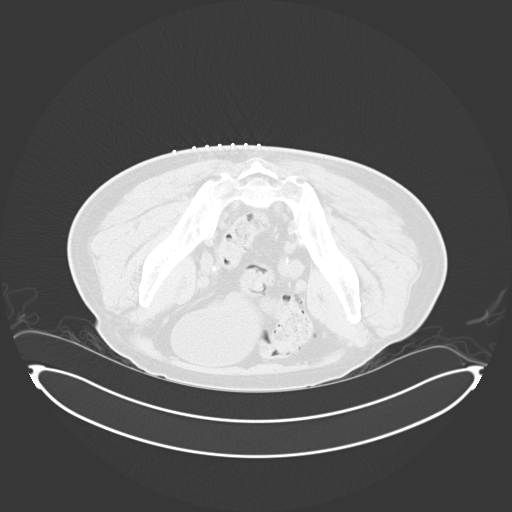
[im 10/27  lung]
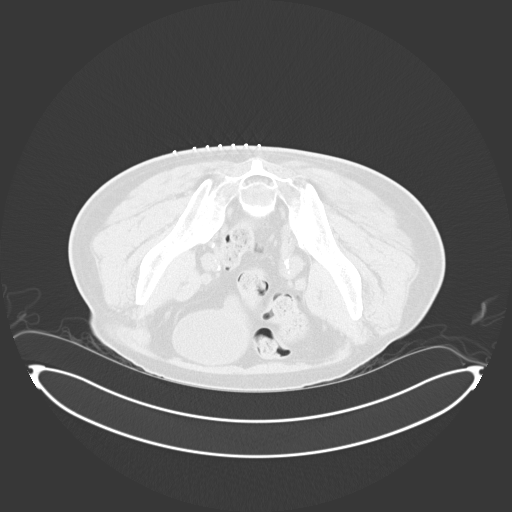
[im 11/27  lung]
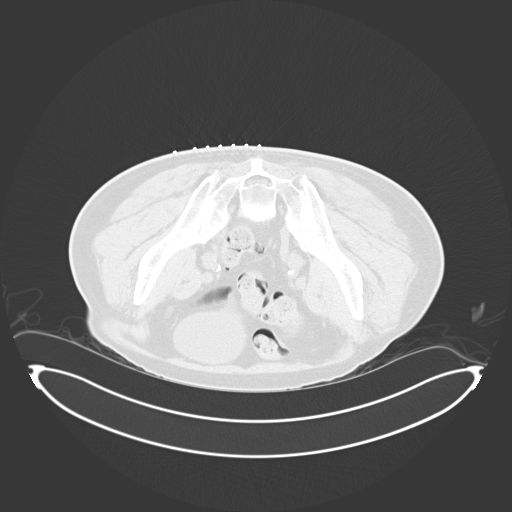
[im 14/27  lung]
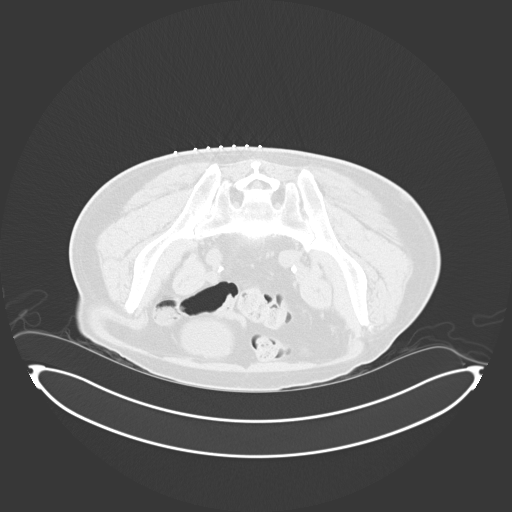
[im 16/27  mediastinal]
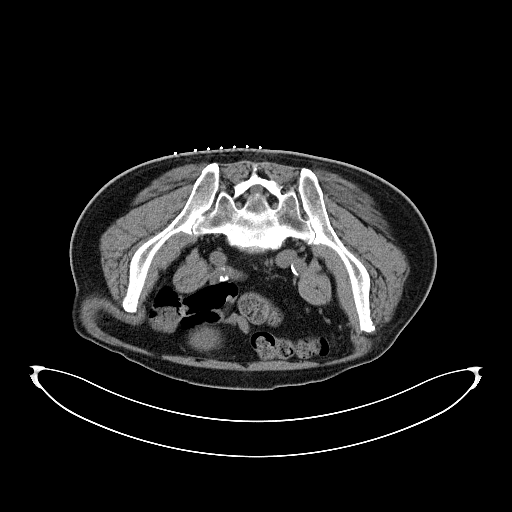
[im 16/27  lung]
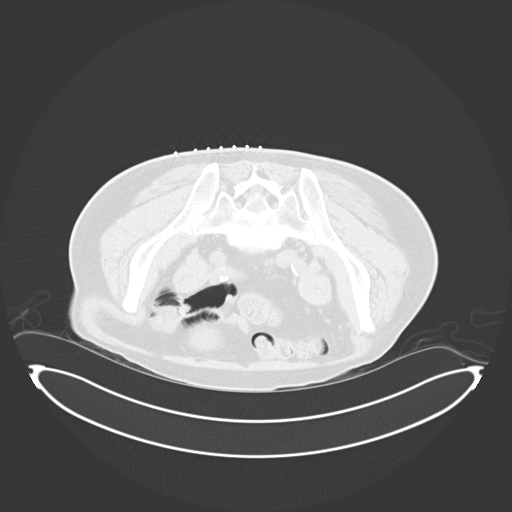
[im 17/27  lung]
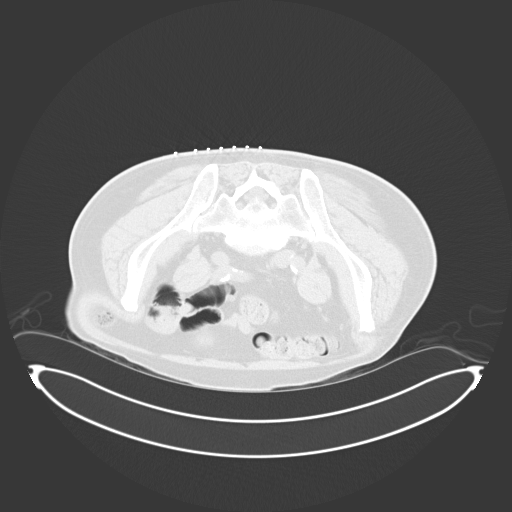
[im 19/27  lung]
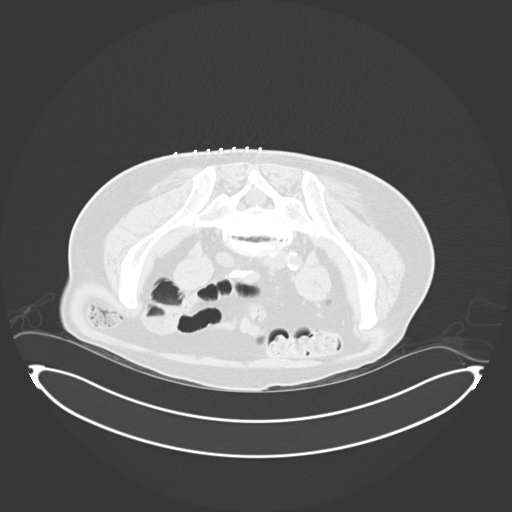
[im 20/27  lung]
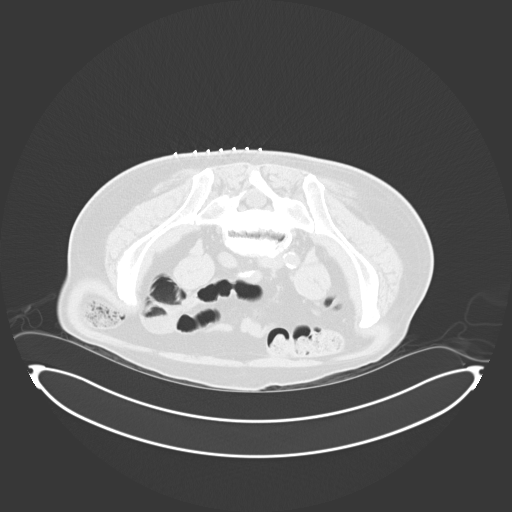
[im 22/27  mediastinal]
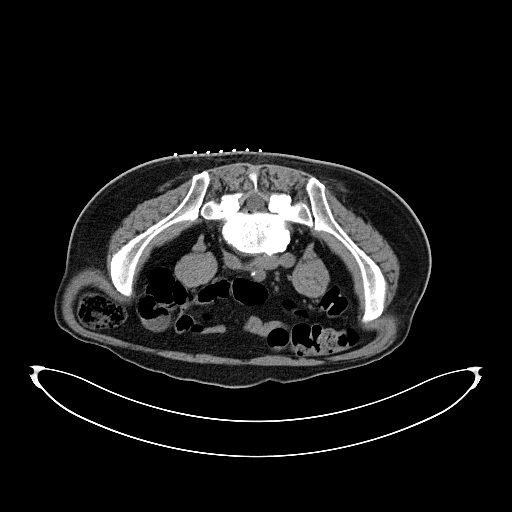
[im 22/27  lung]
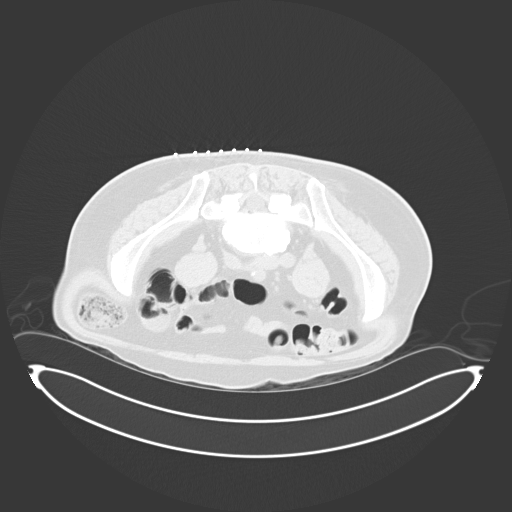
[im 23/27  lung]
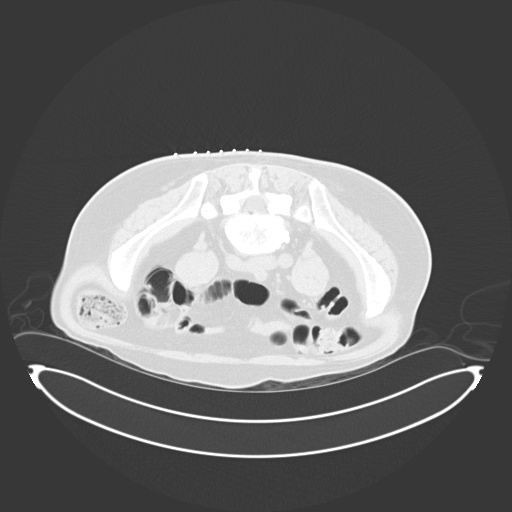
[im 25/27  lung]
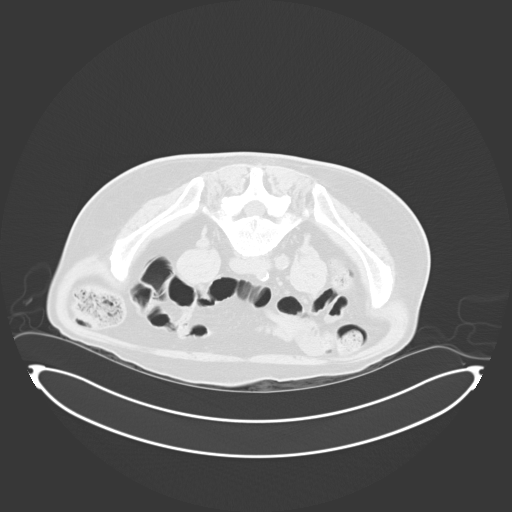

[15 of 28 positions shown; findings below may reference images not displayed]

EXAM:
CT GUIDED RIGHT ILIAC BONE MARROW ASPIRATION AND CORE BIOPSY

Radiologist:  Riis, Khwabena

Guidance:  CT

FLUOROSCOPY TIME:  Fluoroscopy Time: NONE.

MEDICATIONS:
1% lidocaine local

ANESTHESIA/SEDATION:
0 mg IV Versed; 50 mcg IV Fentanyl

Moderate Sedation Time:  None.

The patient was continuously monitored during the procedure by the
interventional radiology nurse under my direct supervision.

CONTRAST:  None

COMPLICATIONS:
None

PROCEDURE:
Informed consent was obtained from the patient following explanation
of the procedure, risks, benefits and alternatives. The patient
understands, agrees and consents for the procedure. All questions
were addressed. A time out was performed.

The patient was positioned prone and non-contrast localization CT
was performed of the pelvis to demonstrate the iliac marrow spaces.

Maximal barrier sterile technique utilized including caps, mask,
sterile gowns, sterile gloves, large sterile drape, hand hygiene,
and Betadine prep.

Under sterile conditions and local anesthesia, an 11 gauge coaxial
bone biopsy needle was advanced into the right iliac marrow space.
Needle position was confirmed with CT imaging. Initially, bone
marrow aspiration was performed. Next, the 11 gauge outer cannula
was utilized to obtain a right iliac bone marrow core biopsy. Needle
was removed. Hemostasis was obtained with compression. The patient
tolerated the procedure well. Samples were prepared with the
cytotechnologist. No immediate complications.
IMPRESSION: CT guided right iliac bone marrow aspiration and core biopsy.

## 2019-12-21 DIAGNOSIS — R531 Weakness: Secondary | ICD-10-CM | POA: Diagnosis not present

## 2019-12-21 DIAGNOSIS — E1129 Type 2 diabetes mellitus with other diabetic kidney complication: Secondary | ICD-10-CM | POA: Diagnosis not present

## 2019-12-21 DIAGNOSIS — I129 Hypertensive chronic kidney disease with stage 1 through stage 4 chronic kidney disease, or unspecified chronic kidney disease: Secondary | ICD-10-CM | POA: Diagnosis not present

## 2019-12-21 DIAGNOSIS — N1831 Chronic kidney disease, stage 3a: Secondary | ICD-10-CM | POA: Diagnosis not present

## 2020-01-02 ENCOUNTER — Inpatient Hospital Stay (HOSPITAL_BASED_OUTPATIENT_CLINIC_OR_DEPARTMENT_OTHER): Payer: Medicare PPO | Admitting: Hematology

## 2020-01-02 ENCOUNTER — Inpatient Hospital Stay: Payer: Medicare PPO | Attending: Hematology

## 2020-01-02 ENCOUNTER — Other Ambulatory Visit: Payer: Self-pay

## 2020-01-02 VITALS — BP 106/72 | HR 72 | Temp 96.5°F | Resp 18 | Ht 75.0 in | Wt 181.5 lb

## 2020-01-02 DIAGNOSIS — D539 Nutritional anemia, unspecified: Secondary | ICD-10-CM | POA: Diagnosis not present

## 2020-01-02 DIAGNOSIS — C946 Myelodysplastic disease, not classified: Secondary | ICD-10-CM

## 2020-01-02 DIAGNOSIS — Z87891 Personal history of nicotine dependence: Secondary | ICD-10-CM | POA: Diagnosis not present

## 2020-01-02 DIAGNOSIS — E785 Hyperlipidemia, unspecified: Secondary | ICD-10-CM | POA: Diagnosis not present

## 2020-01-02 DIAGNOSIS — Z79899 Other long term (current) drug therapy: Secondary | ICD-10-CM | POA: Diagnosis not present

## 2020-01-02 DIAGNOSIS — Z7982 Long term (current) use of aspirin: Secondary | ICD-10-CM | POA: Diagnosis not present

## 2020-01-02 DIAGNOSIS — D469 Myelodysplastic syndrome, unspecified: Secondary | ICD-10-CM

## 2020-01-02 DIAGNOSIS — D696 Thrombocytopenia, unspecified: Secondary | ICD-10-CM

## 2020-01-02 DIAGNOSIS — I10 Essential (primary) hypertension: Secondary | ICD-10-CM | POA: Diagnosis not present

## 2020-01-02 DIAGNOSIS — Z8673 Personal history of transient ischemic attack (TIA), and cerebral infarction without residual deficits: Secondary | ICD-10-CM | POA: Insufficient documentation

## 2020-01-02 LAB — CMP (CANCER CENTER ONLY)
ALT: 13 U/L (ref 0–44)
AST: 13 U/L — ABNORMAL LOW (ref 15–41)
Albumin: 4 g/dL (ref 3.5–5.0)
Alkaline Phosphatase: 65 U/L (ref 38–126)
Anion gap: 8 (ref 5–15)
BUN: 27 mg/dL — ABNORMAL HIGH (ref 8–23)
CO2: 28 mmol/L (ref 22–32)
Calcium: 9.6 mg/dL (ref 8.9–10.3)
Chloride: 103 mmol/L (ref 98–111)
Creatinine: 1.23 mg/dL (ref 0.61–1.24)
GFR, Estimated: 55 mL/min — ABNORMAL LOW (ref >60)
Glucose, Bld: 176 mg/dL — ABNORMAL HIGH (ref 70–99)
Potassium: 4.3 mmol/L (ref 3.5–5.1)
Sodium: 139 mmol/L (ref 135–145)
Total Bilirubin: 1.3 mg/dL — ABNORMAL HIGH (ref 0.3–1.2)
Total Protein: 6.5 g/dL (ref 6.5–8.1)

## 2020-01-02 LAB — CBC WITH DIFFERENTIAL/PLATELET
Abs Immature Granulocytes: 0.03 10*3/uL (ref 0.00–0.07)
Basophils Absolute: 0 10*3/uL (ref 0.0–0.1)
Basophils Relative: 0 %
Eosinophils Absolute: 0 10*3/uL (ref 0.0–0.5)
Eosinophils Relative: 0 %
HCT: 32.2 % — ABNORMAL LOW (ref 39.0–52.0)
Hemoglobin: 10 g/dL — ABNORMAL LOW (ref 13.0–17.0)
Immature Granulocytes: 1 %
Lymphocytes Relative: 11 %
Lymphs Abs: 0.7 10*3/uL (ref 0.7–4.0)
MCH: 34 pg (ref 26.0–34.0)
MCHC: 31.1 g/dL (ref 30.0–36.0)
MCV: 109.5 fL — ABNORMAL HIGH (ref 80.0–100.0)
Monocytes Absolute: 1.2 10*3/uL — ABNORMAL HIGH (ref 0.1–1.0)
Monocytes Relative: 20 %
Neutro Abs: 4 10*3/uL (ref 1.7–7.7)
Neutrophils Relative %: 68 %
Platelets: 159 10*3/uL (ref 150–400)
RBC: 2.94 MIL/uL — ABNORMAL LOW (ref 4.22–5.81)
RDW: 17.6 % — ABNORMAL HIGH (ref 11.5–15.5)
WBC: 5.9 10*3/uL (ref 4.0–10.5)
nRBC: 0.3 % — ABNORMAL HIGH (ref 0.0–0.2)

## 2020-01-02 LAB — SAMPLE TO BLOOD BANK

## 2020-01-02 NOTE — Progress Notes (Signed)
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 male who has been referred to [place] by Dr Cyndia Skeeters, Charlesetta Ivory, MD  for evaluation and management of macrocytic anemia and thrombocytopenia.  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  Patient was that he has been feeling well overall and was recently seen by Dr. Reynaldo Minium his primary care physician about 7 to 10 days prior to admission and is not aware of any abnormal labs at the time.  He does not recollect the exact blood counts at the time.  He does note that he had labs about 7 to 10 days prior to admission as well as 6 months prior to admission and there was no reported concerns on those.  Patient notes that he was cooking Thanksgiving lunch and developed lightheadedness and dizziness with presyncopal symptomatology.  He had to sit down on recliner and the dizziness still persisted for about 3 minutes and was associated with mild chest tightness resulting in a call to EMS who reportedly noted a blood pressure of 70 systolic. He reports that he had a had a good breakfast and was drinking enough water. He notes that prior to this episode he was feeling well and had no real new concerns or other symptoms.  In the emergency room his EKG showed sinus bradycardia with a heart rate of 58 bpm.  His blood pressure improved with fluid resuscitation.  Labs on admission -CBC showed a hemoglobin of 8.2 with an MCV of 122, RDW of 15, platelets of 114k, WBC counts within normal limits at 6.7k with normal differential.  He was noted to have some  mild monocytosis of 1.2k. 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  B12 within normal limits at 673, folate of 41.8, TSH within normal limits at 2.12. Patient denies any history of liver disease. Patient denies significant alcohol use. No other new focal symptomatology.  Absolute reticulocyte count of 61k with increase in immature reticulocyte fraction. Denies any overt bleeding.  Normal bowel movements no hematochezia or melena. LDH was within normal limits at 175 and bilirubin was within normal limits and suggest against overt hemolysis.  Repeat labs today show hemoglobin is at 7.8 with platelet counts of 104k Patient notes no acute symptomatology and no overt dizziness while in bed. No bone pains.  No abnormal bruising. No fevers no chills no night sweats no abnormal weight loss. No other revealing focal symptomatology.  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 The patient's last visit with [place] was on 09/04/2019. The pt reports that he is doing well overall.  The pt reports that he saw his PCP per our last recommendation and was diagnosed with Type 2 Diabetes. Pt has been placed on a long-acting insulin. He was also experiencing prostate symptoms and was placed on Alfuzosin. He denies any other new symptoms   Lab results today (01/02/20) of CBC w/diff and CMP is as follows: all values are WNL except for RBC at 2.94, Hgb at 10.0, HCT at 32.2, MCV at 109.5, RDW at 17.6, nRBC  at 0.3, Mono Abs at 1.2K, RBC Morphology shows "Burr Cells", Smear Review shows "Large Platelets", Schistocytes are "Present", Tear Drop Cells are "Present", Target Cells are "Present", Glucose at 176, BUN at 27, AST at 13, Total Bilirubin at 1.3, GFR Est at 55.  On review of systems, pt denies bruising, abnormal/excessive bleeding, fatigue and any other symptoms.   MEDICAL HISTORY:  Past Medical History:   Diagnosis Date  . Hyperlipidemia   . Hypertension   . Stroke [facility]) 1970    SURGICAL HISTORY: Past Surgical History:  Procedure Laterality Date  . APPENDECTOMY  1980  . INTRAVASCULAR ULTRASOUND/IVUS N/A 12/10/2016   Procedure: Intravascular Ultrasound/IVUS;  Surgeon: Nigel Mormon, MD;  Location: [place] [facility];  Service: Cardiovascular;  Laterality: N/A;  . LEFT HEART CATH AND CORONARY ANGIOGRAPHY N/A 12/10/2016   Procedure: LEFT HEART CATH AND CORONARY ANGIOGRAPHY;  Surgeon: Nigel Mormon, MD;  Location: [place] [facility];  Service: Cardiovascular;  Laterality: N/A;    SOCIAL HISTORY: Social History   Socioeconomic History  . Marital status: Single    Spouse name: Not on file  . Number of children: 0  . Years of education: Not on file  . Highest education level: Not on file  Occupational History  . Not on file  Tobacco Use  . Smoking status: Former Smoker    Packs/day: 1.00    Years: 20.00    Pack years: 20.00    Types: Cigarettes    Quit date: 03/23/1978    Years since quitting: 41.8  . Smokeless tobacco: Never Used  Substance and Sexual Activity  . Alcohol use: Yes    Alcohol/week: 0.0 standard drinks    Comment: wine/has not had any in 1 year  . Drug use: No  . Sexual activity: Not on file  Other Topics Concern  . Not on file  Social History Narrative  . Not on file   Social Determinants of Health   Financial Resource Strain:   . Difficulty of Paying Living Expenses: Not on file  Food Insecurity:   . Worried About Charity fundraiser in the Last Year: Not on file  . Ran Out of Food in the Last Year: Not on file  Transportation Needs:   . Lack of Transportation (Medical): Not on file  . Lack of Transportation (Non-Medical): Not on file  Physical Activity:   . Days of Exercise per Week: Not on file  . Minutes of Exercise per Session: Not on file  Stress:   . Feeling of Stress : Not on file  Social Connections:   . Frequency of  Communication with Friends and Family: Not on file  . Frequency of Social Gatherings with Friends and Family: Not on file  . Attends Religious Services: Not on file  . Active Member of Clubs or Organizations: Not on file  . Attends Archivist Meetings: Not on file  . Marital Status: Not on file  Intimate Partner Violence:   . Fear of Current or Ex-Partner: Not on file  . Emotionally Abused: Not on file  . Physically Abused: Not on file  . Sexually Abused: Not on file    FAMILY HISTORY: Family History  Problem Relation Age of Onset  . Colon cancer Neg Hx   . Esophageal cancer Neg Hx   . Rectal cancer Neg Hx   . Stomach cancer Neg Hx     ALLERGIES:  has No Known Allergies.  MEDICATIONS:  Current Outpatient Medications  Medication Sig Dispense Refill  . aspirin EC 81 MG tablet Take 81 mg by mouth daily.    Marland Kitchen JARDIANCE 25 MG TABS tablet Take 25 mg by mouth at bedtime.    . metoprolol succinate (TOPROL-XL) 50 MG 24 hr tablet TAKE 1 TABLET BY MOUTH DAILY 90 tablet 2  . Multiple Vitamins-Minerals (CENTRUM SILVER PO) Take 1 tablet by mouth at bedtime.     . rosuvastatin (CRESTOR) 20 MG tablet TAKE 1 TABLET BY MOUTH DAILY 90 tablet 2  . SitaGLIPtin-MetFORMIN HCl (JANUMET XR) [phone] MG TB24 Take 1 tablet by mouth every evening.     No current facility-administered medications for this visit.    REVIEW OF SYSTEMS:   A 10+ POINT REVIEW OF SYSTEMS WAS OBTAINED including neurology, dermatology, psychiatry, cardiac, respiratory, lymph, extremities, GI, GU, Musculoskeletal, constitutional, breasts, reproductive, HEENT.  All pertinent positives are noted in the HPI.  All others are negative.   PHYSICAL EXAMINATION:  Vitals:   01/02/20 1329  BP: 106/72  Pulse: 72  Resp: 18  Temp: (!) 96.5 F (35.8 C)  SpO2: 98%   Filed Weights   01/02/20 1329  Weight: 181 lb 8 oz (82.3 kg)   .Body mass index is 22.69 kg/m.   GENERAL:alert, in no acute distress and  comfortable SKIN: no acute rashes, no significant lesions EYES: conjunctiva are pink and non-injected, sclera anicteric OROPHARYNX: MMM, no exudates, no oropharyngeal erythema or ulceration NECK: supple, no JVD LYMPH:  no palpable lymphadenopathy in the cervical, axillary or inguinal regions LUNGS: clear to auscultation b/l with normal respiratory effort HEART: regular rate & rhythm ABDOMEN:  normoactive bowel sounds , non tender, not distended. No palpable hepatosplenomegaly.  Extremity: no pedal edema PSYCH: alert & oriented x 3 with fluent speech NEURO: no focal motor/sensory deficits  LABORATORY DATA:  I have reviewed the data as listed  . CBC Latest Ref Rng & Units 01/02/2020 09/04/2019 05/05/2019  WBC 4.0 - 10.5 K/uL 5.9 6.7 7.3  Hemoglobin 13.0 - 17.0 g/dL 10.0(L) 10.3(L) 11.3(L)  Hematocrit 39 - 52 % 32.2(L) 32.9(L) 36.3(L)  Platelets 150 - 400 K/uL 159 130(L) 138(L)   . CBC    Component Value Date/Time   WBC 5.9 01/02/2020 1225   RBC 2.94 (L) 01/02/2020 1225   HGB 10.0 (L) 01/02/2020 1225   HGB 8.9 (L) 03/01/2018 1019   HCT 32.2 (L) 01/02/2020 1225   PLT 159 01/02/2020 1225   PLT 150 03/01/2018 1019   MCV 109.5 (H) 01/02/2020 1225   MCH 34.0 01/02/2020 1225   MCHC 31.1 01/02/2020 1225   RDW 17.6 (H) 01/02/2020 1225   LYMPHSABS 0.7 01/02/2020 1225   MONOABS 1.2 (H) 01/02/2020 1225   EOSABS 0.0 01/02/2020 1225   BASOSABS 0.0 01/02/2020 1225    . CMP Latest Ref Rng & Units 01/02/2020 09/04/2019 05/05/2019  Glucose 70 - 99 mg/dL 176(H) 381(H) 231(H)  BUN 8 - 23 mg/dL 27(H) 28(H) 30(H)  Creatinine 0.61 - 1.24 mg/dL 1.23 1.39(H) 1.38(H)  Sodium 135 - 145 mmol/L 139 138 139  Potassium 3.5 - 5.1 mmol/L 4.3 4.7 4.5  Chloride 98 - 111 mmol/L 103 103 102  CO2 22 - 32 mmol/L 28 23 25   Calcium 8.9 - 10.3 mg/dL 9.6 9.8 9.9  Total Protein 6.5 - 8.1 g/dL 6.5 6.9 8.0  Total Bilirubin 0.3 - 1.2 mg/dL 1.3(H) 1.0 0.9  Alkaline Phos 38 - 126 U/L 65 71 74  AST 15 - 41 U/L  13(L) 10(L) 16  ALT  0 - 44 U/L 13 15 19    02/23/18 BM Bx:   02/23/18 BM Flow Cytometry:   02/23/18 Cytogenetics:    RADIOGRAPHIC STUDIES: I have personally reviewed the radiological images as listed and agreed with the findings in the report. No results found.  ASSESSMENT & PLAN:   79 y.o. 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 No overt clinical evidence of acute bleeding.  Fecal occult blood test x1-.  Clinically no reported bleeding. Ferritin is in normal limits. B12 and folate within normal limits.  Normal RDW suggests against overt nutritional deficiency. TSH within normal limits Peripheral blood smear evidence of anisopoikilocytosis. Hgb has improved from 7.8 to 8.9 without transfusion.  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 Now resolved.  PLAN:  -Discussed pt labwork today, 01/02/20; blood counts and chemistries are stable.  -No lab or clinical evidence of significant MDS progression at this time.   -Will continue watchful observation, given clinical and lab stability. Will begin supportive therapies as needed. -Will see back in 4 months with labs    FOLLOW UP: RTC with Dr Irene Limbo with labs in 4 months   The total time spent in the appt was 20 minutes and more than 50% was on counseling and direct patient cares.  All of the patient's questions were answered with apparent satisfaction. The patient knows to call the clinic with any problems, questions or concerns.    Sullivan Lone MD [place] AAHIVMS [facility] Hematology/Oncology Physician [facility]  (Office):       [phone] (Work cell):  [phone] (Fax):           [phone]  I, Yevette Edwards, am acting as a scribe for Dr. Sullivan Lone.   .I have reviewed the above documentation for accuracy and completeness, and I agree with the above. Brunetta Genera MD

## 2020-01-04 DIAGNOSIS — N1831 Chronic kidney disease, stage 3a: Secondary | ICD-10-CM | POA: Diagnosis not present

## 2020-01-04 DIAGNOSIS — Z23 Encounter for immunization: Secondary | ICD-10-CM | POA: Diagnosis not present

## 2020-01-04 DIAGNOSIS — Z794 Long term (current) use of insulin: Secondary | ICD-10-CM | POA: Diagnosis not present

## 2020-01-04 DIAGNOSIS — I129 Hypertensive chronic kidney disease with stage 1 through stage 4 chronic kidney disease, or unspecified chronic kidney disease: Secondary | ICD-10-CM | POA: Diagnosis not present

## 2020-01-04 DIAGNOSIS — E1129 Type 2 diabetes mellitus with other diabetic kidney complication: Secondary | ICD-10-CM | POA: Diagnosis not present

## 2020-02-05 DIAGNOSIS — M542 Cervicalgia: Secondary | ICD-10-CM | POA: Diagnosis not present

## 2020-02-14 DIAGNOSIS — N1831 Chronic kidney disease, stage 3a: Secondary | ICD-10-CM | POA: Diagnosis not present

## 2020-02-14 DIAGNOSIS — E1129 Type 2 diabetes mellitus with other diabetic kidney complication: Secondary | ICD-10-CM | POA: Diagnosis not present

## 2020-02-14 DIAGNOSIS — I129 Hypertensive chronic kidney disease with stage 1 through stage 4 chronic kidney disease, or unspecified chronic kidney disease: Secondary | ICD-10-CM | POA: Diagnosis not present

## 2020-02-14 DIAGNOSIS — Z794 Long term (current) use of insulin: Secondary | ICD-10-CM | POA: Diagnosis not present

## 2020-02-16 ENCOUNTER — Other Ambulatory Visit: Payer: Self-pay | Admitting: Cardiology

## 2020-03-03 NOTE — Progress Notes (Signed)
No show

## 2020-03-04 ENCOUNTER — Ambulatory Visit: Payer: Medicare Other | Admitting: Cardiology

## 2020-03-28 DIAGNOSIS — I129 Hypertensive chronic kidney disease with stage 1 through stage 4 chronic kidney disease, or unspecified chronic kidney disease: Secondary | ICD-10-CM | POA: Diagnosis not present

## 2020-03-28 DIAGNOSIS — N1831 Chronic kidney disease, stage 3a: Secondary | ICD-10-CM | POA: Diagnosis not present

## 2020-03-28 DIAGNOSIS — Z794 Long term (current) use of insulin: Secondary | ICD-10-CM | POA: Diagnosis not present

## 2020-03-28 DIAGNOSIS — E1129 Type 2 diabetes mellitus with other diabetic kidney complication: Secondary | ICD-10-CM | POA: Diagnosis not present

## 2020-04-30 DIAGNOSIS — L570 Actinic keratosis: Secondary | ICD-10-CM | POA: Diagnosis not present

## 2020-04-30 DIAGNOSIS — D225 Melanocytic nevi of trunk: Secondary | ICD-10-CM | POA: Diagnosis not present

## 2020-04-30 DIAGNOSIS — D1801 Hemangioma of skin and subcutaneous tissue: Secondary | ICD-10-CM | POA: Diagnosis not present

## 2020-04-30 DIAGNOSIS — Z85828 Personal history of other malignant neoplasm of skin: Secondary | ICD-10-CM | POA: Diagnosis not present

## 2020-04-30 DIAGNOSIS — L720 Epidermal cyst: Secondary | ICD-10-CM | POA: Diagnosis not present

## 2020-04-30 DIAGNOSIS — Z85820 Personal history of malignant melanoma of skin: Secondary | ICD-10-CM | POA: Diagnosis not present

## 2020-04-30 DIAGNOSIS — L814 Other melanin hyperpigmentation: Secondary | ICD-10-CM | POA: Diagnosis not present

## 2020-04-30 DIAGNOSIS — L821 Other seborrheic keratosis: Secondary | ICD-10-CM | POA: Diagnosis not present

## 2020-04-30 NOTE — Progress Notes (Incomplete)
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 male who has been referred to [place] by Dr Cyndia Skeeters, Charlesetta Ivory, MD  for evaluation and management of macrocytic anemia and thrombocytopenia.  Patient is an overall healthy 80 year old male, retired [employer], with a history of hypertension, dyslipidemia, CVA with resolved right-sided weakness at age 62 of unclear etiology.  Patient was that he has been feeling well overall and was recently seen by Dr. Reynaldo Minium his primary care physician about 7 to 10 days prior to admission and is not aware of any abnormal labs at the time.  He does not recollect the exact blood counts at the time.  He does note that he had labs about 7 to 10 days prior to admission as well as 6 months prior to admission and there was no reported concerns on those.  Patient notes that he was cooking Thanksgiving lunch and developed lightheadedness and dizziness with presyncopal symptomatology.  He had to sit down on recliner and the dizziness still persisted for about 3 minutes and was associated with mild chest tightness resulting in a call to EMS who reportedly noted a blood pressure of 70 systolic. He reports that he had a had a good breakfast and was drinking enough water. He notes that prior to this episode he was feeling well and had no real new concerns or other symptoms.  In the emergency room his EKG showed sinus bradycardia with a heart rate of 58 bpm.  His blood pressure improved with fluid resuscitation.  Labs on admission -CBC showed a hemoglobin of 8.2 with an MCV of 122, RDW of 15, platelets of 114k, WBC counts within normal limits at 6.7k with normal differential.  He was noted to have some  mild monocytosis of 1.2k. 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  B12 within normal limits at 673, folate of 41.8, TSH within normal limits at 2.12. Patient denies any history of liver disease. Patient denies significant alcohol use. No other new focal symptomatology.  Absolute reticulocyte count of 61k with increase in immature reticulocyte fraction. Denies any overt bleeding.  Normal bowel movements no hematochezia or melena. LDH was within normal limits at 175 and bilirubin was within normal limits and suggest against overt hemolysis.  Repeat labs today show hemoglobin is at 7.8 with platelet counts of 104k Patient notes no acute symptomatology and no overt dizziness while in bed. No bone pains.  No abnormal bruising. No fevers no chills no night sweats no abnormal weight loss. No other revealing focal symptomatology.  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 The patient's last visit with [place] was on 01/02/2020. The pt reports that he is doing well overall.  The pt reports ***  Lab results today 05/01/2020 of CBC w/diff and CMP is as follows: all values are WNL except for ***  On review of systems, pt reports *** and denies *** and any other symptoms.  MEDICAL HISTORY:  Past Medical History:  Diagnosis Date  . Hyperlipidemia   . Hypertension   . Stroke [facility]) 1970    SURGICAL HISTORY: Past Surgical History:  Procedure Laterality Date  . APPENDECTOMY  1980  .  INTRAVASCULAR ULTRASOUND/IVUS N/A 12/10/2016   Procedure: Intravascular Ultrasound/IVUS;  Surgeon: Nigel Mormon, MD;  Location: [place] [facility];  Service: Cardiovascular;  Laterality: N/A;  . LEFT HEART CATH AND CORONARY ANGIOGRAPHY N/A 12/10/2016   Procedure: LEFT HEART CATH AND CORONARY ANGIOGRAPHY;  Surgeon: Nigel Mormon, MD;  Location: [place] [facility];  Service: Cardiovascular;   Laterality: N/A;    SOCIAL HISTORY: Social History   Socioeconomic History  . Marital status: Single    Spouse name: Not on file  . Number of children: 0  . Years of education: Not on file  . Highest education level: Not on file  Occupational History  . Not on file  Tobacco Use  . Smoking status: Former Smoker    Packs/day: 1.00    Years: 20.00    Pack years: 20.00    Types: Cigarettes    Quit date: 03/23/1978    Years since quitting: 42.1  . Smokeless tobacco: Never Used  Substance and Sexual Activity  . Alcohol use: Yes    Alcohol/week: 0.0 standard drinks    Comment: wine/has not had any in 1 year  . Drug use: No  . Sexual activity: Not on file  Other Topics Concern  . Not on file  Social History Narrative  . Not on file   Social Determinants of Health   Financial Resource Strain: Not on file  Food Insecurity: Not on file  Transportation Needs: Not on file  Physical Activity: Not on file  Stress: Not on file  Social Connections: Not on file  Intimate Partner Violence: Not on file    FAMILY HISTORY: Family History  Problem Relation Age of Onset  . Colon cancer Neg Hx   . Esophageal cancer Neg Hx   . Rectal cancer Neg Hx   . Stomach cancer Neg Hx     ALLERGIES:  has No Known Allergies.  MEDICATIONS:  Current Outpatient Medications  Medication Sig Dispense Refill  . aspirin EC 81 MG tablet Take 81 mg by mouth daily.    Marland Kitchen JARDIANCE 25 MG TABS tablet Take 25 mg by mouth at bedtime.    . metoprolol succinate (TOPROL-XL) 50 MG 24 hr tablet TAKE 1 TABLET BY MOUTH DAILY 90 tablet 2  . Multiple Vitamins-Minerals (CENTRUM SILVER PO) Take 1 tablet by mouth at bedtime.     . rosuvastatin (CRESTOR) 20 MG tablet TAKE 1 TABLET BY MOUTH DAILY 90 tablet 2  . SitaGLIPtin-MetFORMIN HCl (JANUMET XR) [phone] MG TB24 Take 1 tablet by mouth every evening.     No current facility-administered medications for this visit.    REVIEW OF SYSTEMS:   10 Point review of Systems  was done is negative except as noted above.  PHYSICAL EXAMINATION:  There were no vitals filed for this visit. There were no vitals filed for this visit. .There is no height or weight on file to calculate BMI.   *** GENERAL:alert, in no acute distress and comfortable SKIN: no acute rashes, no significant lesions EYES: conjunctiva are pink and non-injected, sclera anicteric OROPHARYNX: MMM, no exudates, no oropharyngeal erythema or ulceration NECK: supple, no JVD LYMPH:  no palpable lymphadenopathy in the cervical, axillary or inguinal regions LUNGS: clear to auscultation b/l with normal respiratory effort HEART: regular rate & rhythm ABDOMEN:  normoactive bowel sounds , non tender, not distended. Extremity: no pedal edema PSYCH: alert & oriented x 3 with fluent speech NEURO: no focal motor/sensory deficits  LABORATORY DATA:  I have reviewed the  data as listed  . CBC Latest Ref Rng & Units 01/02/2020 09/04/2019 05/05/2019  WBC 4.0 - 10.5 K/uL 5.9 6.7 7.3  Hemoglobin 13.0 - 17.0 g/dL 10.0(L) 10.3(L) 11.3(L)  Hematocrit 39.0 - 52.0 % 32.2(L) 32.9(L) 36.3(L)  Platelets 150 - 400 K/uL 159 130(L) 138(L)   . CBC    Component Value Date/Time   WBC 5.9 01/02/2020 1225   RBC 2.94 (L) 01/02/2020 1225   HGB 10.0 (L) 01/02/2020 1225   HGB 8.9 (L) 03/01/2018 1019   HCT 32.2 (L) 01/02/2020 1225   PLT 159 01/02/2020 1225   PLT 150 03/01/2018 1019   MCV 109.5 (H) 01/02/2020 1225   MCH 34.0 01/02/2020 1225   MCHC 31.1 01/02/2020 1225   RDW 17.6 (H) 01/02/2020 1225   LYMPHSABS 0.7 01/02/2020 1225   MONOABS 1.2 (H) 01/02/2020 1225   EOSABS 0.0 01/02/2020 1225   BASOSABS 0.0 01/02/2020 1225    . CMP Latest Ref Rng & Units 01/02/2020 09/04/2019 05/05/2019  Glucose 70 - 99 mg/dL 176(H) 381(H) 231(H)  BUN 8 - 23 mg/dL 27(H) 28(H) 30(H)  Creatinine 0.61 - 1.24 mg/dL 1.23 1.39(H) 1.38(H)  Sodium 135 - 145 mmol/L 139 138 139  Potassium 3.5 - 5.1 mmol/L 4.3 4.7 4.5  Chloride 98 - 111  mmol/L 103 103 102  CO2 22 - 32 mmol/L 28 23 25   Calcium 8.9 - 10.3 mg/dL 9.6 9.8 9.9  Total Protein 6.5 - 8.1 g/dL 6.5 6.9 8.0  Total Bilirubin 0.3 - 1.2 mg/dL 1.3(H) 1.0 0.9  Alkaline Phos 38 - 126 U/L 65 71 74  AST 15 - 41 U/L 13(L) 10(L) 16  ALT 0 - 44 U/L 13 15 19    02/23/18 BM Bx:   02/23/18 BM Flow Cytometry:   02/23/18 Cytogenetics:    RADIOGRAPHIC STUDIES: I have personally reviewed the radiological images as listed and agreed with the findings in the report. No results found.  ASSESSMENT & PLAN:   80 y.o. 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 No overt clinical evidence of acute bleeding.  Fecal occult blood test x1-.  Clinically no reported bleeding. Ferritin is in normal limits. B12 and folate within normal limits.  Normal RDW suggests against overt nutritional deficiency. TSH within normal limits Peripheral blood smear evidence of anisopoikilocytosis. Hgb has improved from 7.8 to 8.9 without transfusion.  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 Now resolved.  PLAN:  -Discussed pt labwork today, 05/01/2020; ***   -No lab or clinical evidence of significant MDS progression at this time.   -Will continue watchful observation, given clinical and lab stability. Will begin supportive therapies as needed. -Will see back ***   FOLLOW UP: ***   The total time spent in the appt was 20 minutes and more than 50% was on counseling and direct patient cares.  All of the patient's questions were answered with apparent satisfaction. The patient  knows to call the clinic with any problems, questions or concerns.    Sullivan Lone MD [place] AAHIVMS [facility] Hematology/Oncology Physician [facility]  (Office):       [phone] (Work  cell):  [phone] (Fax):           [phone]  I, Reinaldo Raddle, am acting as scribe for Dr. Sullivan Lone, MD.

## 2020-05-01 ENCOUNTER — Inpatient Hospital Stay: Payer: Medicare PPO | Attending: Internal Medicine

## 2020-05-01 ENCOUNTER — Other Ambulatory Visit: Payer: Self-pay | Admitting: Hematology

## 2020-05-01 ENCOUNTER — Inpatient Hospital Stay: Payer: Medicare PPO | Admitting: Hematology

## 2020-06-03 ENCOUNTER — Encounter (HOSPITAL_COMMUNITY): Payer: Self-pay

## 2020-06-03 ENCOUNTER — Emergency Department (HOSPITAL_COMMUNITY)
Admission: EM | Admit: 2020-06-03 | Discharge: 2020-06-03 | Disposition: A | Discharge status: Home / Self Care | Payer: Medicare PPO | Attending: Emergency Medicine | Admitting: Emergency Medicine

## 2020-06-03 ENCOUNTER — Emergency Department (HOSPITAL_COMMUNITY): Payer: Medicare PPO

## 2020-06-03 ENCOUNTER — Other Ambulatory Visit: Payer: Self-pay

## 2020-06-03 DIAGNOSIS — Z7982 Long term (current) use of aspirin: Secondary | ICD-10-CM | POA: Diagnosis not present

## 2020-06-03 DIAGNOSIS — Z79899 Other long term (current) drug therapy: Secondary | ICD-10-CM | POA: Diagnosis not present

## 2020-06-03 DIAGNOSIS — K5909 Other constipation: Secondary | ICD-10-CM | POA: Insufficient documentation

## 2020-06-03 DIAGNOSIS — I10 Essential (primary) hypertension: Secondary | ICD-10-CM | POA: Insufficient documentation

## 2020-06-03 DIAGNOSIS — Z7984 Long term (current) use of oral hypoglycemic drugs: Secondary | ICD-10-CM | POA: Insufficient documentation

## 2020-06-03 DIAGNOSIS — R109 Unspecified abdominal pain: Secondary | ICD-10-CM | POA: Diagnosis not present

## 2020-06-03 DIAGNOSIS — R000 Tachycardia, unspecified: Secondary | ICD-10-CM | POA: Diagnosis not present

## 2020-06-03 DIAGNOSIS — E119 Type 2 diabetes mellitus without complications: Secondary | ICD-10-CM | POA: Diagnosis not present

## 2020-06-03 DIAGNOSIS — Z87891 Personal history of nicotine dependence: Secondary | ICD-10-CM | POA: Insufficient documentation

## 2020-06-03 DIAGNOSIS — I25118 Atherosclerotic heart disease of native coronary artery with other forms of angina pectoris: Secondary | ICD-10-CM | POA: Insufficient documentation

## 2020-06-03 DIAGNOSIS — R0602 Shortness of breath: Secondary | ICD-10-CM | POA: Insufficient documentation

## 2020-06-03 DIAGNOSIS — K5900 Constipation, unspecified: Secondary | ICD-10-CM | POA: Diagnosis present

## 2020-06-03 HISTORY — DX: Type 2 diabetes mellitus without complications: E11.9

## 2020-06-03 LAB — CBC WITH DIFFERENTIAL/PLATELET
Abs Immature Granulocytes: 0.35 10*3/uL — ABNORMAL HIGH (ref 0.00–0.07)
Band Neutrophils: 7 %
Basophils Absolute: 0 10*3/uL (ref 0.0–0.1)
Basophils Relative: 0 %
Blasts: 0 %
Eosinophils Absolute: 0 10*3/uL (ref 0.0–0.5)
Eosinophils Relative: 0 %
HCT: 29.2 % — ABNORMAL LOW (ref 39.0–52.0)
Hemoglobin: 8.3 g/dL — ABNORMAL LOW (ref 13.0–17.0)
Lymphocytes Relative: 10 %
Lymphs Abs: 0.9 10*3/uL (ref 0.7–4.0)
MCH: 28.7 pg (ref 26.0–34.0)
MCHC: 28.4 g/dL — ABNORMAL LOW (ref 30.0–36.0)
MCV: 101 fL — ABNORMAL HIGH (ref 80.0–100.0)
Metamyelocytes Relative: 3 %
Monocytes Absolute: 0.5 10*3/uL (ref 0.1–1.0)
Monocytes Relative: 6 %
Myelocytes: 1 %
Neutro Abs: 6 10*3/uL (ref 1.7–7.7)
Neutrophils Relative %: 62 %
Other: 11 %
Platelets: 49 10*3/uL — ABNORMAL LOW (ref 150–400)
Promyelocytes Relative: 0 %
RBC: 2.89 MIL/uL — ABNORMAL LOW (ref 4.22–5.81)
RDW: 19.5 % — ABNORMAL HIGH (ref 11.5–15.5)
WBC: 8.7 10*3/uL (ref 4.0–10.5)
nRBC: 0 /100 WBC
nRBC: 4.5 % — ABNORMAL HIGH (ref 0.0–0.2)

## 2020-06-03 LAB — URINALYSIS, ROUTINE W REFLEX MICROSCOPIC
Bacteria, UA: NONE SEEN
Bilirubin Urine: NEGATIVE
Glucose, UA: >=500 mg/dL — AB
Hgb urine dipstick: NEGATIVE
Ketones, ur: NEGATIVE mg/dL
Leukocytes,Ua: NEGATIVE
Nitrite: NEGATIVE
Protein, ur: NEGATIVE mg/dL
Specific Gravity, Urine: 1.024 (ref 1.005–1.030)
pH: 5 (ref 5.0–8.0)

## 2020-06-03 LAB — CBG MONITORING, ED: Glucose-Capillary: 284 mg/dL — ABNORMAL HIGH (ref 70–99)

## 2020-06-03 LAB — COMPREHENSIVE METABOLIC PANEL
ALT: 14 U/L (ref 0–44)
AST: 14 U/L — ABNORMAL LOW (ref 15–41)
Albumin: 4.1 g/dL (ref 3.5–5.0)
Alkaline Phosphatase: 79 U/L (ref 38–126)
Anion gap: 9 (ref 5–15)
BUN: 37 mg/dL — ABNORMAL HIGH (ref 8–23)
CO2: 22 mmol/L (ref 22–32)
Calcium: 9.5 mg/dL (ref 8.9–10.3)
Chloride: 111 mmol/L (ref 98–111)
Creatinine, Ser: 1.52 mg/dL — ABNORMAL HIGH (ref 0.61–1.24)
GFR, Estimated: 46 mL/min — ABNORMAL LOW (ref >60)
Glucose, Bld: 260 mg/dL — ABNORMAL HIGH (ref 70–99)
Potassium: 4.2 mmol/L (ref 3.5–5.1)
Sodium: 142 mmol/L (ref 135–145)
Total Bilirubin: 1.2 mg/dL (ref 0.3–1.2)
Total Protein: 7.2 g/dL (ref 6.5–8.1)

## 2020-06-03 MED ORDER — SODIUM CHLORIDE 0.9 % IV SOLN: INTRAVENOUS | Status: DC

## 2020-06-03 MED ORDER — ALBUTEROL SULFATE HFA 108 (90 BASE) MCG/ACT IN AERS: 2.0000 | INHALATION_SPRAY | RESPIRATORY_TRACT | Status: DC | PRN

## 2020-06-03 MED ORDER — IOHEXOL 300 MG/ML  SOLN
100.0000 mL | Freq: Once | INTRAMUSCULAR | Status: AC | PRN
  Administered 2020-06-03: 100 mL via INTRAVENOUS

## 2020-06-03 MED ORDER — SORBITOL 70 % SOLN
960.0000 mL | TOPICAL_OIL | Freq: Once | ORAL | Status: AC
  Administered 2020-06-03: 960 mL via RECTAL
  Filled 2020-06-03: qty 473

## 2020-06-03 NOTE — ED Notes (Signed)
Pt to CT

## 2020-06-03 NOTE — ED Notes (Signed)
Large stool mass noted after enema

## 2020-06-03 NOTE — ED Triage Notes (Addendum)
Patient c/o SOB, constipation  And abdominal pain x 2 months  Patient states he can not walk 10 feet without having SOB.  CBG- 284. Patient states he "did not give himself insulin because he was tired"

## 2020-06-03 NOTE — Discharge Instructions (Addendum)
Continue to use your MiraLAX once or twice a day to improve your stooling.  Try to eat foods which have a lot of fiber in them.  Also drink plenty of water every day.  See your doctor for a checkup next week.

## 2020-06-03 NOTE — ED Provider Notes (Signed)
[place] DEPT Provider Note   CSN: [id number] Arrival date & time: 06/03/20  1058     History Chief Complaint  Patient presents with  . Constipation  . Shortness of Breath  . Bloated    Steve Maldonado is a 80 y.o. male.  HPI He presents for ongoing shortness of breath and constipation for several months.  He also is having pain in his left upper quadrant of his abdomen.  He was at home today with several neighbors, and his girlfriend who encouraged him to come to the ED for evaluation.  Has not seen his PCP recently.  He states he has been troubled by urinary incontinence without dysuria, for several months.  He denies fever, chills, cough, chest pain, focal weakness or paresthesia.  He is taking his usual medications as prescribed.  There are no other known modifying factors.    Past Medical History:  Diagnosis Date  . Diabetes mellitus without complication ([place])   . Hyperlipidemia   . Hypertension   . Stroke [facility]) 1970    Patient Active Problem List   Diagnosis Date Noted  . Coronary artery disease of native artery of native heart with stable angina pectoris ([place]) 08/26/2018  . Monocytosis   . Orthostatic hypotension 02/18/2018  . Near syncope 02/17/2018  . Hypotension 02/17/2018  . Diabetes mellitus type 2 in nonobese ([place]) 02/17/2018  . Macrocytic anemia 02/17/2018  . Thrombocytopenia ([place]) 02/17/2018  . History of CVA in adulthood 02/17/2018  . Dyspnea on exertion 12/06/2016  . Upper airway cough syndrome 06/08/2013  . MIXED HYPERLIPIDEMIA 11/15/2008  . Essential hypertension 11/15/2008  . CHEST PAIN 11/15/2008    Past Surgical History:  Procedure Laterality Date  . APPENDECTOMY  1980  . INTRAVASCULAR ULTRASOUND/IVUS N/A 12/10/2016   Procedure: Intravascular Ultrasound/IVUS;  Surgeon: Nigel Mormon, MD;  Location: [place] [facility];  Service: Cardiovascular;  Laterality: N/A;  . LEFT HEART CATH AND CORONARY ANGIOGRAPHY  N/A 12/10/2016   Procedure: LEFT HEART CATH AND CORONARY ANGIOGRAPHY;  Surgeon: Nigel Mormon, MD;  Location: [place] [facility];  Service: Cardiovascular;  Laterality: N/A;       Family History  Problem Relation Age of Onset  . Colon cancer Neg Hx   . Esophageal cancer Neg Hx   . Rectal cancer Neg Hx   . Stomach cancer Neg Hx     Social History   Tobacco Use  . Smoking status: Former Smoker    Packs/day: 1.00    Years: 20.00    Pack years: 20.00    Types: Cigarettes    Quit date: 03/23/1978    Years since quitting: 42.2  . Smokeless tobacco: Never Used  Vaping Use  . Vaping Use: Never used  Substance Use Topics  . Alcohol use: Not Currently    Alcohol/week: 0.0 standard drinks  . Drug use: No    Home Medications Prior to Admission medications   Medication Sig Start Date End Date Taking? Authorizing Provider  aspirin EC 81 MG tablet Take 81 mg by mouth daily.    [provider]  JARDIANCE 25 MG TABS tablet Take 25 mg by mouth at bedtime. 02/15/18   [provider]  metoprolol succinate (TOPROL-XL) 50 MG 24 hr tablet TAKE 1 TABLET BY MOUTH DAILY 11/16/19   Patwardhan, Reynold Bowen, MD  Multiple Vitamins-Minerals (CENTRUM SILVER PO) Take 1 tablet by mouth at bedtime.     [provider]  rosuvastatin (CRESTOR) 20 MG tablet  TAKE 1 TABLET BY MOUTH DAILY 02/20/20   Patwardhan, Reynold Bowen, MD  SitaGLIPtin-MetFORMIN HCl (JANUMET XR) [phone] MG TB24 Take 1 tablet by mouth every evening.    [provider]    Allergies    Patient has no known allergies.  Review of Systems   Review of Systems  All other systems reviewed and are negative.   Physical Exam Updated Vital Signs BP 112/68   Pulse 86   Temp 98.5 F (36.9 C)   Resp (!) 24   Ht 6\' 3"  (1.905 m)   Wt 83.9 kg   SpO2 95%   BMI 23.12 kg/m   Physical Exam Vitals and nursing note reviewed.  Constitutional:      General: He is not in acute distress.    Appearance: He is  well-developed. He is not ill-appearing, toxic-appearing or diaphoretic.     Comments: Elderly, frail  HENT:     Head: Normocephalic and atraumatic.     Right Ear: External ear normal.     Left Ear: External ear normal.  Eyes:     Conjunctiva/sclera: Conjunctivae normal.     Pupils: Pupils are equal, round, and reactive to light.  Neck:     Trachea: Phonation normal.  Cardiovascular:     Rate and Rhythm: Normal rate and regular rhythm.     Heart sounds: Normal heart sounds.  Pulmonary:     Effort: Pulmonary effort is normal. No respiratory distress.     Breath sounds: Normal breath sounds. No stridor.  Abdominal:     General: There is distension.     Palpations: Abdomen is soft. There is no mass.     Tenderness: There is no abdominal tenderness.     Hernia: No hernia is present.  Musculoskeletal:        General: Normal range of motion.     Cervical back: Normal range of motion and neck supple.  Skin:    General: Skin is warm and dry.  Neurological:     Mental Status: He is alert and oriented to person, place, and time.     Cranial Nerves: No cranial nerve deficit.     Sensory: No sensory deficit.     Motor: No abnormal muscle tone.     Coordination: Coordination normal.  Psychiatric:        Mood and Affect: Mood normal.        Behavior: Behavior normal.        Thought Content: Thought content normal.        Judgment: Judgment normal.     ED Results / Procedures / Treatments   Labs (all labs ordered are listed, but only abnormal results are displayed) Labs Reviewed  COMPREHENSIVE METABOLIC PANEL - Abnormal; Notable for the following components:      Result Value   Glucose, Bld 260 (*)    BUN 37 (*)    Creatinine, Ser 1.52 (*)    AST 14 (*)    GFR, Estimated 46 (*)    All other components within normal limits  CBC WITH DIFFERENTIAL/PLATELET - Abnormal; Notable for the following components:   RBC 2.89 (*)    Hemoglobin 8.3 (*)    HCT 29.2 (*)    MCV 101.0 (*)     MCHC 28.4 (*)    RDW 19.5 (*)    Platelets 49 (*)    nRBC 4.5 (*)    All other components within normal limits  URINALYSIS, ROUTINE W REFLEX MICROSCOPIC - Abnormal; Notable  for the following components:   Glucose, UA >=500 (*)    All other components within normal limits  CBG MONITORING, ED - Abnormal; Notable for the following components:   Glucose-Capillary 284 (*)    All other components within normal limits    EKG EKG Interpretation  Date/Time:  Monday June 03 2020 11:22:00 EDT Ventricular Rate:  101 PR Interval:    QRS Duration: 143 QT Interval:  402 QTC Calculation: 522 R Axis:   -51 Text Interpretation: Sinus tachycardia Right bundle branch block Abnormal inferior Q waves Since last tracing rate faster and Right bundle branch block is new Otherwise no significant change Confirmed by Daleen Bo [phone]) on 06/03/2020 11:38:42 AM   Radiology DG Chest 2 View  Result Date: 06/03/2020 CLINICAL DATA:  Shortness of breath. EXAM: CHEST - 2 VIEW COMPARISON:  02/17/2018 FINDINGS: Both lungs are clear. Heart and mediastinum are within normal limits. No large pleural effusions. Negative for pneumothorax. Trachea is midline. IMPRESSION: No active cardiopulmonary disease. Electronically Signed   By: Markus Daft M.D.   On: 06/03/2020 11:44   CT Abdomen Pelvis W Contrast  Result Date: 06/03/2020 CLINICAL DATA:  Abdominal pain, constipation. EXAM: CT ABDOMEN AND PELVIS WITH CONTRAST TECHNIQUE: Multidetector CT imaging of the abdomen and pelvis was performed using the standard protocol following bolus administration of intravenous contrast. CONTRAST:  1103mL OMNIPAQUE IOHEXOL 300 MG/ML  SOLN COMPARISON:  None. FINDINGS: Lower chest: Minimal bibasilar subsegmental atelectasis is noted. Hepatobiliary: No focal liver abnormality is seen. No gallstones, gallbladder wall thickening, or biliary dilatation. Pancreas: Unremarkable. No pancreatic ductal dilatation or surrounding inflammatory changes.  Spleen: Calcified splenic granulomata are noted. Moderate splenomegaly is noted. Adrenals/Urinary Tract: Adrenal glands and kidneys are unremarkable. No hydronephrosis or renal obstruction is noted. No renal or ureteral calculi are noted. At least 2 small bladder diverticula are noted posteriorly and superiorly. Stomach/Bowel: Stomach appears normal. There is no evidence of bowel obstruction or inflammation. Status post appendectomy. Vascular/Lymphatic: Aortic atherosclerosis. No enlarged abdominal or pelvic lymph nodes. Reproductive: Mild prostatic enlargement is noted. Other: No abdominal wall hernia or abnormality. No abdominopelvic ascites. Musculoskeletal: No acute or significant osseous findings. IMPRESSION: 1. Moderate splenomegaly. 2. At least 2 small bladder diverticula are noted posteriorly and superiorly. 3. Mild prostatic enlargement. 4. Aortic atherosclerosis. Aortic Atherosclerosis (ICD10-I70.0). Electronically Signed   By: Marijo Conception M.D.   On: 06/03/2020 14:32    Procedures Procedures   Medications Ordered in ED Medications  albuterol (VENTOLIN HFA) 108 (90 Base) MCG/ACT inhaler 2 puff (has no administration in time range)  0.9 %  sodium chloride infusion ( Intravenous Stopped 06/03/20 1552)  iohexol (OMNIPAQUE) 300 MG/ML solution 100 mL (100 mLs Intravenous Contrast Given 06/03/20 1414)  sorbitol, milk of mag, mineral oil, glycerin (SMOG) enema (960 mLs Rectal Given 06/03/20 1540)    ED Course  I have reviewed the triage vital signs and the nursing notes.  Pertinent labs & imaging results that were available during my care of the patient were reviewed by me and considered in my medical decision making (see chart for details).  Clinical Course as of 06/05/20 2215  Wed Jun 05, 2020  2213 Basophils Absolute: 0.0 [EW]    Clinical Course User Index [EW] Daleen Bo, MD   MDM Rules/Calculators/A&P                           Patient Vitals for the past 24 hrs:  BP Temp  Temp src Pulse Resp SpO2 Height Weight  06/03/20 1634 112/68 98.5 F (36.9 C) -- 86 (!) 24 95 % -- --  06/03/20 1500 112/66 -- -- 82 18 96 % -- --  06/03/20 1430 95/68 -- -- 92 (!) 23 92 % -- --  06/03/20 1400 (!) 107/59 -- -- 81 (!) 24 96 % -- --  06/03/20 1330 117/65 -- -- 84 (!) 21 94 % -- --  06/03/20 1220 (!) 113/59 -- -- 82 (!) 23 95 % -- --  06/03/20 1110 -- -- -- -- -- -- 6\' 3"  (1.905 m) 83.9 kg  06/03/20 1109 (!) 134/113 98.6 F (37 C) Oral (!) 118 20 96 % -- --    At time of discharge- reevaluation with update and discussion. After initial assessment and treatment, an updated evaluation reveals comfortable after large bowel movement in the ED, findings discussed and questions answered. Daleen Bo   Medical Decision Making:  This patient is presenting for evaluation of malaise with various symptoms, which does require a range of treatment options, and is a complaint that involves a moderate risk of morbidity and mortality. The differential diagnoses include occult infection, metabolic disorder, intravascular volume disorder, intra-abdominal process. I decided to review old records, and in summary elderly male with nonspecific ongoing symptoms, requiring evaluation in the ED..  I did not require additional historical information from anyone.  Clinical Laboratory Tests Ordered, included CBC, Metabolic panel and Urinalysis. Review indicates normal except hemoglobin low, MCV high, increase absolute immature white cells, glucose high, BUN high, creatinine high, AST low, GFR low, glucose and urine. Radiologic Tests Ordered, included CT abdomen pelvis, x-ray chest.  I independently Visualized: Radiographic images, which show no acute abnormality    Critical Interventions-clinical evaluation, laboratory testing, radiologic imaging, enema, observation reassessment  After These Interventions, the Patient was reevaluated and was found stable for discharge with sensation of discomfort  after bowel movement, following enema.  Doubt acute intra-abdominal infection, metabolic disorder or serious bacterial infection.  CRITICAL CARE-no Performed by: Daleen Bo  Nursing Notes Reviewed/ Care Coordinated Applicable Imaging Reviewed Interpretation of Laboratory Data incorporated into ED treatment  The patient appears reasonably screened and/or stabilized for discharge and I doubt any other medical condition or other [facility] requiring further screening, evaluation, or treatment in the ED at this time prior to discharge.  Plan: Home Medications-continue usual medicines, including MiraLAX; Home Treatments-high-fiber diet and plenty of fluid; return here if the recommended treatment, does not improve the symptoms; Recommended follow up-PCP, as needed     Final Clinical Impression(s) / ED Diagnoses Final diagnoses:  Other constipation    Rx / DC Orders ED Discharge Orders    None       Daleen Bo, MD 06/05/20 2215

## 2020-06-04 LAB — PATHOLOGIST SMEAR REVIEW

## 2020-06-08 ENCOUNTER — Encounter (HOSPITAL_COMMUNITY): Payer: Self-pay

## 2020-06-08 ENCOUNTER — Emergency Department (HOSPITAL_COMMUNITY): Payer: Medicare PPO

## 2020-06-08 ENCOUNTER — Other Ambulatory Visit: Payer: Self-pay

## 2020-06-08 ENCOUNTER — Inpatient Hospital Stay (HOSPITAL_COMMUNITY)
Admission: EM | Admit: 2020-06-08 | Discharge: 2020-06-21 | DRG: 834 | Disposition: E | Payer: Medicare PPO | Attending: Internal Medicine | Admitting: Internal Medicine

## 2020-06-08 DIAGNOSIS — Z20822 Contact with and (suspected) exposure to covid-19: Secondary | ICD-10-CM | POA: Diagnosis present

## 2020-06-08 DIAGNOSIS — Z7189 Other specified counseling: Secondary | ICD-10-CM

## 2020-06-08 DIAGNOSIS — J9601 Acute respiratory failure with hypoxia: Secondary | ICD-10-CM | POA: Diagnosis not present

## 2020-06-08 DIAGNOSIS — H9190 Unspecified hearing loss, unspecified ear: Secondary | ICD-10-CM | POA: Diagnosis present

## 2020-06-08 DIAGNOSIS — N1831 Chronic kidney disease, stage 3a: Secondary | ICD-10-CM | POA: Diagnosis present

## 2020-06-08 DIAGNOSIS — R918 Other nonspecific abnormal finding of lung field: Secondary | ICD-10-CM | POA: Diagnosis not present

## 2020-06-08 DIAGNOSIS — R0602 Shortness of breath: Secondary | ICD-10-CM

## 2020-06-08 DIAGNOSIS — E872 Acidosis: Secondary | ICD-10-CM | POA: Diagnosis present

## 2020-06-08 DIAGNOSIS — R9431 Abnormal electrocardiogram [ECG] [EKG]: Secondary | ICD-10-CM | POA: Diagnosis present

## 2020-06-08 DIAGNOSIS — Z6821 Body mass index (BMI) 21.0-21.9, adult: Secondary | ICD-10-CM

## 2020-06-08 DIAGNOSIS — I2510 Atherosclerotic heart disease of native coronary artery without angina pectoris: Secondary | ICD-10-CM | POA: Diagnosis present

## 2020-06-08 DIAGNOSIS — Z8673 Personal history of transient ischemic attack (TIA), and cerebral infarction without residual deficits: Secondary | ICD-10-CM | POA: Diagnosis not present

## 2020-06-08 DIAGNOSIS — D689 Coagulation defect, unspecified: Secondary | ICD-10-CM | POA: Diagnosis not present

## 2020-06-08 DIAGNOSIS — I129 Hypertensive chronic kidney disease with stage 1 through stage 4 chronic kidney disease, or unspecified chronic kidney disease: Secondary | ICD-10-CM | POA: Diagnosis present

## 2020-06-08 DIAGNOSIS — D469 Myelodysplastic syndrome, unspecified: Secondary | ICD-10-CM | POA: Diagnosis not present

## 2020-06-08 DIAGNOSIS — N189 Chronic kidney disease, unspecified: Secondary | ICD-10-CM | POA: Diagnosis not present

## 2020-06-08 DIAGNOSIS — R634 Abnormal weight loss: Secondary | ICD-10-CM | POA: Diagnosis present

## 2020-06-08 DIAGNOSIS — E1122 Type 2 diabetes mellitus with diabetic chronic kidney disease: Secondary | ICD-10-CM | POA: Diagnosis present

## 2020-06-08 DIAGNOSIS — C9200 Acute myeloblastic leukemia, not having achieved remission: Principal | ICD-10-CM

## 2020-06-08 DIAGNOSIS — R000 Tachycardia, unspecified: Secondary | ICD-10-CM | POA: Diagnosis not present

## 2020-06-08 DIAGNOSIS — D61818 Other pancytopenia: Secondary | ICD-10-CM | POA: Diagnosis present

## 2020-06-08 DIAGNOSIS — I25118 Atherosclerotic heart disease of native coronary artery with other forms of angina pectoris: Secondary | ICD-10-CM | POA: Diagnosis not present

## 2020-06-08 DIAGNOSIS — G319 Degenerative disease of nervous system, unspecified: Secondary | ICD-10-CM | POA: Diagnosis not present

## 2020-06-08 DIAGNOSIS — R29818 Other symptoms and signs involving the nervous system: Secondary | ICD-10-CM | POA: Diagnosis not present

## 2020-06-08 DIAGNOSIS — E1165 Type 2 diabetes mellitus with hyperglycemia: Secondary | ICD-10-CM | POA: Diagnosis present

## 2020-06-08 DIAGNOSIS — E119 Type 2 diabetes mellitus without complications: Secondary | ICD-10-CM | POA: Diagnosis not present

## 2020-06-08 DIAGNOSIS — E870 Hyperosmolality and hypernatremia: Secondary | ICD-10-CM | POA: Diagnosis present

## 2020-06-08 DIAGNOSIS — D649 Anemia, unspecified: Principal | ICD-10-CM | POA: Diagnosis present

## 2020-06-08 DIAGNOSIS — R17 Unspecified jaundice: Secondary | ICD-10-CM | POA: Diagnosis not present

## 2020-06-08 DIAGNOSIS — D539 Nutritional anemia, unspecified: Secondary | ICD-10-CM | POA: Diagnosis present

## 2020-06-08 DIAGNOSIS — R0902 Hypoxemia: Secondary | ICD-10-CM | POA: Diagnosis not present

## 2020-06-08 DIAGNOSIS — R5381 Other malaise: Secondary | ICD-10-CM | POA: Diagnosis present

## 2020-06-08 DIAGNOSIS — I491 Atrial premature depolarization: Secondary | ICD-10-CM | POA: Diagnosis not present

## 2020-06-08 DIAGNOSIS — D631 Anemia in chronic kidney disease: Secondary | ICD-10-CM | POA: Diagnosis present

## 2020-06-08 DIAGNOSIS — E782 Mixed hyperlipidemia: Secondary | ICD-10-CM | POA: Diagnosis present

## 2020-06-08 DIAGNOSIS — N179 Acute kidney failure, unspecified: Secondary | ICD-10-CM | POA: Diagnosis present

## 2020-06-08 DIAGNOSIS — R791 Abnormal coagulation profile: Secondary | ICD-10-CM | POA: Diagnosis present

## 2020-06-08 DIAGNOSIS — K5900 Constipation, unspecified: Secondary | ICD-10-CM | POA: Diagnosis present

## 2020-06-08 DIAGNOSIS — Z87891 Personal history of nicotine dependence: Secondary | ICD-10-CM

## 2020-06-08 DIAGNOSIS — J189 Pneumonia, unspecified organism: Secondary | ICD-10-CM | POA: Diagnosis present

## 2020-06-08 DIAGNOSIS — D696 Thrombocytopenia, unspecified: Secondary | ICD-10-CM

## 2020-06-08 DIAGNOSIS — Z79899 Other long term (current) drug therapy: Secondary | ICD-10-CM

## 2020-06-08 DIAGNOSIS — Z515 Encounter for palliative care: Secondary | ICD-10-CM | POA: Diagnosis not present

## 2020-06-08 DIAGNOSIS — Z66 Do not resuscitate: Secondary | ICD-10-CM | POA: Diagnosis not present

## 2020-06-08 DIAGNOSIS — C9202 Acute myeloblastic leukemia, in relapse: Secondary | ICD-10-CM | POA: Diagnosis not present

## 2020-06-08 DIAGNOSIS — R404 Transient alteration of awareness: Secondary | ICD-10-CM | POA: Diagnosis not present

## 2020-06-08 DIAGNOSIS — I10 Essential (primary) hypertension: Secondary | ICD-10-CM | POA: Diagnosis not present

## 2020-06-08 DIAGNOSIS — I6782 Cerebral ischemia: Secondary | ICD-10-CM | POA: Diagnosis not present

## 2020-06-08 DIAGNOSIS — Z7984 Long term (current) use of oral hypoglycemic drugs: Secondary | ICD-10-CM

## 2020-06-08 DIAGNOSIS — R0600 Dyspnea, unspecified: Secondary | ICD-10-CM | POA: Diagnosis not present

## 2020-06-08 DIAGNOSIS — R9389 Abnormal findings on diagnostic imaging of other specified body structures: Secondary | ICD-10-CM | POA: Diagnosis not present

## 2020-06-08 DIAGNOSIS — J90 Pleural effusion, not elsewhere classified: Secondary | ICD-10-CM | POA: Diagnosis not present

## 2020-06-08 DIAGNOSIS — J984 Other disorders of lung: Secondary | ICD-10-CM | POA: Diagnosis not present

## 2020-06-08 DIAGNOSIS — R0689 Other abnormalities of breathing: Secondary | ICD-10-CM | POA: Diagnosis not present

## 2020-06-08 LAB — URINALYSIS, ROUTINE W REFLEX MICROSCOPIC
Bacteria, UA: NONE SEEN
Bilirubin Urine: NEGATIVE
Glucose, UA: >=500 mg/dL — AB
Hgb urine dipstick: NEGATIVE
Ketones, ur: 20 mg/dL — AB
Leukocytes,Ua: NEGATIVE
Nitrite: NEGATIVE
Protein, ur: 30 mg/dL — AB
Specific Gravity, Urine: 1.018 (ref 1.005–1.030)
pH: 8 (ref 5.0–8.0)

## 2020-06-08 LAB — RETICULOCYTES
Immature Retic Fract: 23.4 % — ABNORMAL HIGH (ref 2.3–15.9)
RBC.: 2.13 MIL/uL — ABNORMAL LOW (ref 4.22–5.81)
Retic Count, Absolute: 13.4 10*3/uL — ABNORMAL LOW (ref 19.0–186.0)
Retic Ct Pct: 0.6 % (ref 0.4–3.1)

## 2020-06-08 LAB — COMPREHENSIVE METABOLIC PANEL
ALT: 20 U/L (ref 0–44)
AST: 23 U/L (ref 15–41)
Albumin: 3.4 g/dL — ABNORMAL LOW (ref 3.5–5.0)
Alkaline Phosphatase: 78 U/L (ref 38–126)
Anion gap: 13 (ref 5–15)
BUN: 32 mg/dL — ABNORMAL HIGH (ref 8–23)
CO2: 24 mmol/L (ref 22–32)
Calcium: 8 mg/dL — ABNORMAL LOW (ref 8.9–10.3)
Chloride: 112 mmol/L — ABNORMAL HIGH (ref 98–111)
Creatinine, Ser: 1.55 mg/dL — ABNORMAL HIGH (ref 0.61–1.24)
GFR, Estimated: 45 mL/min — ABNORMAL LOW (ref >60)
Glucose, Bld: 210 mg/dL — ABNORMAL HIGH (ref 70–99)
Potassium: 3.4 mmol/L — ABNORMAL LOW (ref 3.5–5.1)
Sodium: 149 mmol/L — ABNORMAL HIGH (ref 135–145)
Total Bilirubin: 1.5 mg/dL — ABNORMAL HIGH (ref 0.3–1.2)
Total Protein: 6 g/dL — ABNORMAL LOW (ref 6.5–8.1)

## 2020-06-08 LAB — CBC
HCT: 26.1 % — ABNORMAL LOW (ref 39.0–52.0)
Hemoglobin: 7.4 g/dL — ABNORMAL LOW (ref 13.0–17.0)
MCH: 28.9 pg (ref 26.0–34.0)
MCHC: 28.4 g/dL — ABNORMAL LOW (ref 30.0–36.0)
MCV: 102 fL — ABNORMAL HIGH (ref 80.0–100.0)
Platelets: 31 10*3/uL — ABNORMAL LOW (ref 150–400)
RBC: 2.56 MIL/uL — ABNORMAL LOW (ref 4.22–5.81)
RDW: 19.2 % — ABNORMAL HIGH (ref 11.5–15.5)
WBC: 10.8 10*3/uL — ABNORMAL HIGH (ref 4.0–10.5)
nRBC: 1.2 % — ABNORMAL HIGH (ref 0.0–0.2)

## 2020-06-08 LAB — RESP PANEL BY RT-PCR (FLU A&B, COVID) ARPGX2
Influenza A by PCR: NEGATIVE
Influenza B by PCR: NEGATIVE
SARS Coronavirus 2 by RT PCR: NEGATIVE

## 2020-06-08 LAB — RAPID URINE DRUG SCREEN, HOSP PERFORMED
Amphetamines: NOT DETECTED
Barbiturates: NOT DETECTED
Benzodiazepines: NOT DETECTED
Cocaine: NOT DETECTED
Opiates: NOT DETECTED
Tetrahydrocannabinol: NOT DETECTED

## 2020-06-08 LAB — APTT: aPTT: 33 seconds (ref 24–36)

## 2020-06-08 LAB — PROTIME-INR
INR: 1.5 — ABNORMAL HIGH (ref 0.8–1.2)
Prothrombin Time: 17.4 seconds — ABNORMAL HIGH (ref 11.4–15.2)

## 2020-06-08 LAB — ETHANOL: Alcohol, Ethyl (B): <10 mg/dL (ref <10)

## 2020-06-08 LAB — FOLATE: Folate: 6.3 ng/mL (ref >5.9)

## 2020-06-08 LAB — TSH: TSH: 2.157 u[IU]/mL (ref 0.350–4.500)

## 2020-06-08 LAB — POC OCCULT BLOOD, ED: Fecal Occult Bld: NEGATIVE

## 2020-06-08 MED ORDER — POTASSIUM CHLORIDE IN NACL 20-0.45 MEQ/L-% IV SOLN
INTRAVENOUS | Status: DC
  Filled 2020-06-08: qty 1000

## 2020-06-08 MED ORDER — INSULIN ASPART 100 UNIT/ML ~~LOC~~ SOLN
0.0000 [IU] | Freq: Every day | SUBCUTANEOUS | Status: DC
  Filled 2020-06-08: qty 0.05

## 2020-06-08 MED ORDER — POTASSIUM CHLORIDE CRYS ER 10 MEQ PO TBCR
10.0000 meq | EXTENDED_RELEASE_TABLET | Freq: Once | ORAL | Status: AC
  Administered 2020-06-09: 10 meq via ORAL
  Filled 2020-06-08: qty 1

## 2020-06-08 MED ORDER — ACETAMINOPHEN 325 MG PO TABS: 650.0000 mg | ORAL_TABLET | Freq: Four times a day (QID) | ORAL | Status: DC | PRN

## 2020-06-08 MED ORDER — ROSUVASTATIN CALCIUM 20 MG PO TABS
20.0000 mg | ORAL_TABLET | Freq: Every evening | ORAL | Status: DC
  Administered 2020-06-09 – 2020-06-13 (×5): 20 mg via ORAL
  Filled 2020-06-08 (×4): qty 2
  Filled 2020-06-08: qty 1

## 2020-06-08 MED ORDER — METOPROLOL SUCCINATE ER 50 MG PO TB24: 50.0000 mg | ORAL_TABLET | Freq: Every evening | ORAL | Status: DC

## 2020-06-08 MED ORDER — INSULIN GLARGINE 100 UNIT/ML ~~LOC~~ SOLN
10.0000 [IU] | Freq: Every day | SUBCUTANEOUS | Status: DC
  Administered 2020-06-09 – 2020-06-14 (×6): 10 [IU] via SUBCUTANEOUS
  Filled 2020-06-08 (×6): qty 0.1

## 2020-06-08 MED ORDER — SODIUM CHLORIDE 0.9 % IV BOLUS
500.0000 mL | Freq: Once | INTRAVENOUS | Status: AC
  Administered 2020-06-08: 500 mL via INTRAVENOUS

## 2020-06-08 MED ORDER — INSULIN ASPART 100 UNIT/ML ~~LOC~~ SOLN
0.0000 [IU] | Freq: Three times a day (TID) | SUBCUTANEOUS | Status: DC
  Administered 2020-06-09: 3 [IU] via SUBCUTANEOUS
  Administered 2020-06-09: 2 [IU] via SUBCUTANEOUS
  Administered 2020-06-09 – 2020-06-10 (×2): 3 [IU] via SUBCUTANEOUS
  Administered 2020-06-10: 5 [IU] via SUBCUTANEOUS
  Administered 2020-06-10 – 2020-06-11 (×2): 2 [IU] via SUBCUTANEOUS
  Administered 2020-06-11: 5 [IU] via SUBCUTANEOUS
  Administered 2020-06-11 – 2020-06-12 (×2): 2 [IU] via SUBCUTANEOUS
  Administered 2020-06-12 – 2020-06-13 (×3): 3 [IU] via SUBCUTANEOUS
  Administered 2020-06-13 – 2020-06-14 (×2): 5 [IU] via SUBCUTANEOUS
  Administered 2020-06-14: 3 [IU] via SUBCUTANEOUS
  Filled 2020-06-08: qty 0.09

## 2020-06-08 MED ORDER — LACTATED RINGERS IV BOLUS
1000.0000 mL | Freq: Once | INTRAVENOUS | Status: AC
  Administered 2020-06-08: 1000 mL via INTRAVENOUS

## 2020-06-08 MED ORDER — SODIUM CHLORIDE 0.9 % IV SOLN: INTRAVENOUS | Status: DC | PRN

## 2020-06-08 MED ORDER — ACETAMINOPHEN 650 MG RE SUPP: 650.0000 mg | Freq: Four times a day (QID) | RECTAL | Status: DC | PRN

## 2020-06-08 MED ORDER — POLYETHYLENE GLYCOL 3350 17 G PO PACK
17.0000 g | PACK | Freq: Every day | ORAL | Status: DC | PRN
  Administered 2020-06-09: 17 g via ORAL
  Filled 2020-06-08: qty 1

## 2020-06-08 MED ORDER — MAGNESIUM SULFATE IN D5W 1-5 GM/100ML-% IV SOLN
1.0000 g | Freq: Once | INTRAVENOUS | Status: AC
  Administered 2020-06-08: 1 g via INTRAVENOUS
  Filled 2020-06-08: qty 100

## 2020-06-08 NOTE — ED Notes (Signed)
Patient able to stand with assistance. Unable to ambulate. Reports SOB and feeling weak. MD made aware.

## 2020-06-08 NOTE — H&P (Signed)
History and Physical    Steve Maldonado DVV:[id number] DOB: 26-Dec-1940 DOA: 06/17/2020  PCP: Burnard Bunting, MD   Patient coming from: Home   Chief Complaint: Weakness   HPI: Steve Maldonado is a 80 y.o. male with medical history significant for CAD, hypertension, history of CVA, type 2 diabetes mellitus, mild renal insufficiency, and MDS, now presenting to the emergency department for evaluation of weakness.  Reports that he has been feeling generally weak and easily fatigued for a month or more but has worsened acutely over the past couple days.  Patient notes that when he woke this morning, felt too weak to stand.  He lives alone and is normally independent with his ADLs.  He denies any headache, change in vision or hearing, focal numbness or weakness, muscle aches.  He denies any fevers, chills, chest pain, vomiting, or diarrhea.  Appetite has been poor for weeks to months.  ED Course: Upon arrival to the ED, patient is found to be afebrile, saturating mid 90s on room air, with stable blood pressure.  EKG features sinus rhythm with RBBB and QTc interval 531 ms.  Chest x-ray negative for acute cardiopulmonary disease.  Head CT negative for acute intracranial abnormality.  Panel with glucose 210, sodium 149, and creatinine 1.55.  CBC with mild leukocytosis, macrocytic anemia with hemoglobin 7.4, and platelets 31,000.  INR is 1.5. TSH normal. Patient was given a liter of LR in the ED.  Covid screening test not yet resulted.  Review of Systems:  All other systems reviewed and apart from HPI, are negative.  Past Medical History:  Diagnosis Date  . Diabetes mellitus without complication ([place])   . Hyperlipidemia   . Hypertension   . Stroke [facility]) 1970    Past Surgical History:  Procedure Laterality Date  . APPENDECTOMY  1980  . INTRAVASCULAR ULTRASOUND/IVUS N/A 12/10/2016   Procedure: Intravascular Ultrasound/IVUS;  Surgeon: Nigel Mormon, MD;  Location: [facility];  Service:  Cardiovascular;  Laterality: N/A;  . LEFT HEART CATH AND CORONARY ANGIOGRAPHY N/A 12/10/2016   Procedure: LEFT HEART CATH AND CORONARY ANGIOGRAPHY;  Surgeon: Nigel Mormon, MD;  Location: [facility];  Service: Cardiovascular;  Laterality: N/A;    Social History:   reports that he quit smoking about 42 years ago. His smoking use included cigarettes. He has a 20.00 pack-year smoking history. He has never used smokeless tobacco. He reports previous alcohol use. He reports that he does not use drugs.  No Known Allergies  Family History  Problem Relation Age of Onset  . Colon cancer Neg Hx   . Esophageal cancer Neg Hx   . Rectal cancer Neg Hx   . Stomach cancer Neg Hx      Prior to Admission medications   Medication Sig Start Date End Date Taking? Authorizing Provider  alfuzosin (UROXATRAL) 10 MG 24 hr tablet Take 10 mg by mouth every evening. 05/09/20  Yes [provider]  empagliflozin (JARDIANCE) 25 MG TABS tablet Take 25 mg by mouth every evening.   Yes [provider]  insulin degludec (TRESIBA FLEXTOUCH) 100 UNIT/ML FlexTouch Pen Inject 20 Units into the skin every morning.   Yes [provider]  metoprolol succinate (TOPROL-XL) 50 MG 24 hr tablet TAKE 1 TABLET BY MOUTH DAILY Patient taking differently: Take 50 mg by mouth every evening. 11/16/19  Yes Patwardhan, Manish J, MD  nitroGLYCERIN (NITROSTAT) 0.4 MG SL tablet Place 0.4 mg under the tongue every 5 (five) minutes  as needed for chest pain. 01/04/20  Yes [provider]  rosuvastatin (CRESTOR) 20 MG tablet TAKE 1 TABLET BY MOUTH DAILY Patient taking differently: Take 20 mg by mouth every evening. 02/20/20  Yes Patwardhan, Manish J, MD  SitaGLIPtin-MetFORMIN HCl [phone] MG TB24 Take 1 tablet by mouth every evening.   Yes [provider]    Physical Exam: Vitals:   06/17/2020 2100 06/13/2020 2115 05/28/2020 2130 05/22/2020 2230  BP: (!) 118/44   (!) 106/59  Pulse: 88 92 100 88   Resp: (!) 21   20  Temp:      TempSrc:      SpO2: 92% 93% 91% 93%     Constitutional: NAD, calm  Eyes: PERTLA, lids and conjunctivae normal ENMT: Mucous membranes are moist. Posterior pharynx clear of any exudate or lesions.   Neck: normal, supple, no masses, no thyromegaly Respiratory:  no wheezing, no crackles. No accessory muscle use.  Cardiovascular: S1 & S2 heard, regular rate and rhythm. No extremity edema.  Abdomen: No distension, no tenderness, soft. Bowel sounds active.  Musculoskeletal: no clubbing / cyanosis. No joint deformity upper and lower extremities.   Skin: no significant rashes, lesions, ulcers. Warm, dry, well-perfused. Neurologic: CN 2-12 grossly intact. Sensation intact. Strength 5/5 in all 4 limbs.  Psychiatric: Alert and oriented to person, place, and situation. Pleasant and cooperative.    Labs and Imaging on Admission: I have personally reviewed following labs and imaging studies  CBC: Recent Labs  Lab 06/03/20 1221 05/28/2020 1824  WBC 8.7 10.8*  NEUTROABS 6.0 4.1  HGB 8.3* 7.4*  HCT 29.2* 26.1*  MCV 101.0* 102.0*  PLT 49* 31*   Basic Metabolic Panel: Recent Labs  Lab 06/03/20 1221 06/04/2020 1824  NA 142 149*  K 4.2 3.4*  CL 111 112*  CO2 22 24  GLUCOSE 260* 210*  BUN 37* 32*  CREATININE 1.52* 1.55*  CALCIUM 9.5 8.0*   GFR: Estimated Creatinine Clearance: 45.1 mL/min (A) (by C-G formula based on SCr of 1.55 mg/dL (H)). Liver Function Tests: Recent Labs  Lab 06/03/20 1221 06/09/2020 1824  AST 14* 23  ALT 14 20  ALKPHOS 79 78  BILITOT 1.2 1.5*  PROT 7.2 6.0*  ALBUMIN 4.1 3.4*   No results for input(s): LIPASE, AMYLASE in the last 168 hours. No results for input(s): AMMONIA in the last 168 hours. Coagulation Profile: Recent Labs  Lab 05/27/2020 1824  INR 1.5*   Cardiac Enzymes: No results for input(s): CKTOTAL, CKMB, CKMBINDEX, TROPONINI in the last 168 hours. BNP (last 3 results) No results for input(s): PROBNP in the last  8760 hours. HbA1C: No results for input(s): HGBA1C in the last 72 hours. CBG: Recent Labs  Lab 06/03/20 1110  GLUCAP 284*   Lipid Profile: No results for input(s): CHOL, HDL, LDLCALC, TRIG, CHOLHDL, LDLDIRECT in the last 72 hours. Thyroid Function Tests: Recent Labs    05/30/2020 1826  TSH 2.157   Anemia Panel: Recent Labs    06/14/2020 2206  RETICCTPCT 0.6   Urine analysis:    Component Value Date/Time   COLORURINE YELLOW 06/18/2020 1934   APPEARANCEUR CLEAR 06/14/2020 1934   LABSPEC 1.018 06/05/2020 1934   PHURINE 8.0 06/05/2020 1934   GLUCOSEU >=500 (A) 06/04/2020 1934   HGBUR NEGATIVE 06/17/2020 1934   BILIRUBINUR NEGATIVE 06/12/2020 1934   KETONESUR 20 (A) 05/29/2020 1934   PROTEINUR 30 (A) 06/04/2020 1934   NITRITE NEGATIVE 06/12/2020 1934   LEUKOCYTESUR NEGATIVE 06/09/2020 1934   Sepsis Labs: @  LABRCNTIP(procalcitonin:4,lacticidven:4) )No results found for this or any previous visit (from the past 240 hour(s)).   Radiological Exams on Admission: CT HEAD WO CONTRAST  Result Date: 05/29/2020 CLINICAL DATA:  80 year old male with neurologic deficit. EXAM: CT HEAD WITHOUT CONTRAST TECHNIQUE: Contiguous axial images were obtained from the base of the skull through the vertex without intravenous contrast. COMPARISON:  None. FINDINGS: Brain: Mild age-related atrophy and moderate chronic microvascular ischemic changes. Left periventricular and white matter corona radiata hypodensity, chronic. There is no acute intracranial hemorrhage. No mass effect or midline shift. No extra-axial fluid collection. Vascular: No hyperdense vessel or unexpected calcification. Skull: Normal. Negative for fracture or focal lesion. Sinuses/Orbits: No acute finding. Other: None IMPRESSION: 1. No acute intracranial pathology. 2. Age-related atrophy and chronic microvascular ischemic changes. Electronically Signed   By: Anner Crete M.D.   On: 06/16/2020 20:02   DG Chest Portable 1  View  Result Date: 06/19/2020 CLINICAL DATA:  80 year old male with shortness of breath. EXAM: PORTABLE CHEST 1 VIEW COMPARISON:  Chest radiograph dated 06/03/2020. FINDINGS: No focal consolidation, pleural effusion, or pneumothorax. The cardiac silhouette is within limits. No acute osseous pathology. IMPRESSION: No active disease. Electronically Signed   By: Anner Crete M.D.   On: 06/09/2020 21:56    EKG: Independently reviewed. Sinus rhythm, RBBB, QTc 531 ms.   Assessment/Plan   1. General weakness  - Presents with progressive general weakness, worsening of weeks to months and now unable to ambulate  - No focal deficit noted and no acute findings on head CT; TSH is normal  - Possibly related to worsening anemia  - Check CK, continue IVF hydration, hold beta-blocker, consult PT   2. Anemia; thrombocytopenia  - Hgb is 7.4 (8.3 five days ago) and platelets 31k (49k five days ago) on admission  - No apparent bleeding or infection  - Follows with hematology for MDS but had not required treatment   - Type and screen, check anemia panel, repeat CBC in am    3. Coagulopathy  - INR is 1.5, previously normal  - No known liver disease, no bleeding noted  - Check aPTT     4. Hypernatremia  - Serum sodium 151 (when correcting for hyperglycemia) in setting of hypovolemia  - He was given a liter of LR in ED, will continue IVF hydration with 0.45% saline and repeat chem panel in am    5. Insulin-dependent DM  - Continue CBGs and insulin   6. CAD  - No anginal complaints, continue statin    7. CKD IIIa  - SCr is 1.55 in ED, similar to a few days ago but previously in 1.2 range  - Renally-dose medications, monitor    8. Prolonged QT interval  - QTc is 531 ms in ED  - Replace potassium, check magnesium level, minimize QT-prolonging medications    DVT prophylaxis: SCDs  Code Status: Full  Level of Care: Level of care: Telemetry Family Communication: none present  Disposition Plan:   Patient is from: Home  Anticipated d/c is to: TBD Anticipated d/c date is: Possibly as early as 3/20 Patient currently: Pending PT eval, additional blood work  Consults called: none  Admission status: Observation     Vianne Bulls, MD [facility]  06/10/2020, 11:15 PM

## 2020-06-08 NOTE — ED Triage Notes (Signed)
Pt BIB EMS from home. Pt crawled out of bed and to the kitchen to call his neighbor due to being so weak. Pt was waiting outside on the curb for EMS. Pt was A&O x2 when EMS arrived.   CBG 215 HR 110 BP 90/68 RR 10   18G RAC 55mL NS

## 2020-06-08 NOTE — ED Provider Notes (Signed)
[facility] DEPT Provider Note   CSN: [id number] Arrival date & time: 06/16/2020  1759     History Chief Complaint  Patient presents with  . Weakness    Steve Maldonado is a 80 y.o. male.  HPI   Patient presents to the ED for evaluation of weakness.  Patient states he has been having these issues for the past year.  Patient states he has seen his heart doctor as well as his primary care doctor.  He is not sure why he continues to feel weak.  He has noticed that he has more trouble mowing the lawn.  Today he felt even weaker and felt like he had to crawl around the house.  Patient had to call out to his neighbor for help.  Patient states he has had some issues with constipation recently but is not having any abdominal pain.  Is not have any chest pain or shortness of breath.  No headache.  The weakness is all over and not specifically in one side or the other.  He is not any trouble with his speech or his vision.  He does feel thirsty  Past Medical History:  Diagnosis Date  . Diabetes mellitus without complication ([place])   . Hyperlipidemia   . Hypertension   . Stroke [facility]) 1970    Patient Active Problem List   Diagnosis Date Noted  . Coronary artery disease of native artery of native heart with stable angina pectoris ([place]) 08/26/2018  . Monocytosis   . Orthostatic hypotension 02/18/2018  . Near syncope 02/17/2018  . Hypotension 02/17/2018  . Diabetes mellitus type 2 in nonobese ([place]) 02/17/2018  . Macrocytic anemia 02/17/2018  . Thrombocytopenia ([place]) 02/17/2018  . History of CVA in adulthood 02/17/2018  . Dyspnea on exertion 12/06/2016  . Upper airway cough syndrome 06/08/2013  . MIXED HYPERLIPIDEMIA 11/15/2008  . Essential hypertension 11/15/2008  . CHEST PAIN 11/15/2008    Past Surgical History:  Procedure Laterality Date  . APPENDECTOMY  1980  . INTRAVASCULAR ULTRASOUND/IVUS N/A 12/10/2016   Procedure: Intravascular Ultrasound/IVUS;   Surgeon: Nigel Mormon, MD;  Location: [facility];  Service: Cardiovascular;  Laterality: N/A;  . LEFT HEART CATH AND CORONARY ANGIOGRAPHY N/A 12/10/2016   Procedure: LEFT HEART CATH AND CORONARY ANGIOGRAPHY;  Surgeon: Nigel Mormon, MD;  Location: [facility];  Service: Cardiovascular;  Laterality: N/A;       Family History  Problem Relation Age of Onset  . Colon cancer Neg Hx   . Esophageal cancer Neg Hx   . Rectal cancer Neg Hx   . Stomach cancer Neg Hx     Social History   Tobacco Use  . Smoking status: Former Smoker    Packs/day: 1.00    Years: 20.00    Pack years: 20.00    Types: Cigarettes    Quit date: 03/23/1978    Years since quitting: 42.2  . Smokeless tobacco: Never Used  Vaping Use  . Vaping Use: Never used  Substance Use Topics  . Alcohol use: Not Currently    Alcohol/week: 0.0 standard drinks  . Drug use: No    Home Medications Prior to Admission medications   Medication Sig Start Date End Date Taking? Authorizing Provider  alfuzosin (UROXATRAL) 10 MG 24 hr tablet Take 10 mg by mouth every evening. 05/09/20  Yes [provider]  empagliflozin (JARDIANCE) 25 MG TABS tablet Take 25 mg by mouth every evening.   Yes [provider]  insulin degludec (TRESIBA FLEXTOUCH) 100 UNIT/ML FlexTouch Pen Inject 20 Units into the skin every morning.   Yes [provider]  metoprolol succinate (TOPROL-XL) 50 MG 24 hr tablet TAKE 1 TABLET BY MOUTH DAILY Patient taking differently: Take 50 mg by mouth every evening. 11/16/19  Yes Patwardhan, Manish J, MD  nitroGLYCERIN (NITROSTAT) 0.4 MG SL tablet Place 0.4 mg under the tongue every 5 (five) minutes as needed for chest pain. 01/04/20  Yes [provider]  rosuvastatin (CRESTOR) 20 MG tablet TAKE 1 TABLET BY MOUTH DAILY Patient taking differently: Take 20 mg by mouth every evening. 02/20/20  Yes Patwardhan, Manish J, MD  SitaGLIPtin-MetFORMIN HCl [phone] MG TB24  Take 1 tablet by mouth every evening.   Yes [provider]    Allergies    Patient has no known allergies.  Review of Systems   Review of Systems  All other systems reviewed and are negative.   Physical Exam Updated Vital Signs BP (!) 118/44   Pulse 100   Temp 98.1 F (36.7 C) (Oral)   Resp (!) 21   SpO2 91%   Physical Exam Vitals and nursing note reviewed.  Constitutional:      General: He is not in acute distress.    Appearance: He is well-developed.  HENT:     Head: Normocephalic and atraumatic.     Right Ear: External ear normal.     Left Ear: External ear normal.  Eyes:     General: No scleral icterus.       Right eye: No discharge.        Left eye: No discharge.     Conjunctiva/sclera: Conjunctivae normal.  Neck:     Trachea: No tracheal deviation.  Cardiovascular:     Rate and Rhythm: Normal rate and regular rhythm.  Pulmonary:     Effort: Pulmonary effort is normal. No respiratory distress.     Breath sounds: Normal breath sounds. No stridor. No wheezing or rales.  Abdominal:     General: Bowel sounds are normal. There is no distension.     Palpations: Abdomen is soft.     Tenderness: There is no abdominal tenderness. There is no guarding or rebound.  Musculoskeletal:        General: No tenderness.     Cervical back: Neck supple.  Skin:    General: Skin is warm and dry.     Findings: No rash.  Neurological:     Mental Status: He is alert and oriented to person, place, and time.     Cranial Nerves: No cranial nerve deficit (No facial droop, extraocular movements intact, tongue midline ).     Sensory: No sensory deficit.     Motor: No abnormal muscle tone or seizure activity.     Coordination: Coordination normal.     Comments: No pronator drift bilateral upper extrem, able to hold both legs off bed for 5 seconds, sensation intact in all extremities, no visual field cuts, no left or right sided neglect, normal finger-nose exam bilaterally, no  nystagmus noted      ED Results / Procedures / Treatments   Labs (all labs ordered are listed, but only abnormal results are displayed) Labs Reviewed  PROTIME-INR - Abnormal; Notable for the following components:      Result Value   Prothrombin Time 17.4 (*)    INR 1.5 (*)    All other components within normal limits  CBC - Abnormal; Notable for the following components:  WBC 10.8 (*)    RBC 2.56 (*)    Hemoglobin 7.4 (*)    HCT 26.1 (*)    MCV 102.0 (*)    MCHC 28.4 (*)    RDW 19.2 (*)    Platelets 31 (*)    nRBC 1.2 (*)    All other components within normal limits  DIFFERENTIAL - Abnormal; Notable for the following components:   Monocytes Absolute 2.4 (*)    Abs Immature Granulocytes 1.09 (*)    All other components within normal limits  COMPREHENSIVE METABOLIC PANEL - Abnormal; Notable for the following components:   Sodium 149 (*)    Potassium 3.4 (*)    Chloride 112 (*)    Glucose, Bld 210 (*)    BUN 32 (*)    Creatinine, Ser 1.55 (*)    Calcium 8.0 (*)    Total Protein 6.0 (*)    Albumin 3.4 (*)    Total Bilirubin 1.5 (*)    GFR, Estimated 45 (*)    All other components within normal limits  URINALYSIS, ROUTINE W REFLEX MICROSCOPIC - Abnormal; Notable for the following components:   Glucose, UA >=500 (*)    Ketones, ur 20 (*)    Protein, ur 30 (*)    All other components within normal limits  RESP PANEL BY RT-PCR (FLU A&B, COVID) ARPGX2  ETHANOL  APTT  RAPID URINE DRUG SCREEN, HOSP PERFORMED  TSH  POC OCCULT BLOOD, ED    EKG EKG Interpretation  Date/Time:  Saturday June 08 2020 18:39:05 EDT Ventricular Rate:  99 PR Interval:    QRS Duration: 139 QT Interval:  413 QTC Calculation: 531 R Axis:   -70 Text Interpretation: Sinus rhythm Right bundle branch block Inferior infarct, old No significant change since last tracing Confirmed by Dorie Rank [phone]) on 06/14/2020 6:45:23 PM   Radiology CT HEAD WO CONTRAST  Result Date: 06/09/2020 CLINICAL  DATA:  80 year old male with neurologic deficit. EXAM: CT HEAD WITHOUT CONTRAST TECHNIQUE: Contiguous axial images were obtained from the base of the skull through the vertex without intravenous contrast. COMPARISON:  None. FINDINGS: Brain: Mild age-related atrophy and moderate chronic microvascular ischemic changes. Left periventricular and white matter corona radiata hypodensity, chronic. There is no acute intracranial hemorrhage. No mass effect or midline shift. No extra-axial fluid collection. Vascular: No hyperdense vessel or unexpected calcification. Skull: Normal. Negative for fracture or focal lesion. Sinuses/Orbits: No acute finding. Other: None IMPRESSION: 1. No acute intracranial pathology. 2. Age-related atrophy and chronic microvascular ischemic changes. Electronically Signed   By: Anner Crete M.D.   On: 06/03/2020 20:02    Procedures .Critical Care Performed by: Dorie Rank, MD Authorized by: Dorie Rank, MD   Critical care provider statement:    Critical care time (minutes):  30   Critical care was time spent personally by me on the following activities:  Discussions with consultants, evaluation of patient's response to treatment, examination of patient, ordering and performing treatments and interventions, ordering and review of laboratory studies, ordering and review of radiographic studies, pulse oximetry, re-evaluation of patient's condition, obtaining history from patient or surrogate and review of old charts     Medications Ordered in ED Medications  lactated ringers bolus 1,000 mL (1,000 mLs Intravenous New Bag/Given 05/23/2020 2129)    ED Course  I have reviewed the triage vital signs and the nursing notes.  Pertinent labs & imaging results that were available during my care of the patient were reviewed by me and considered  in my medical decision making (see chart for details).  Clinical Course as of 06/07/2020 2142  Sat Jun 08, 2020  2057 Urinalysis without signs of  infection. [JK]  2057 TSH is normal.  UDS is negative. [JK]  3888 Metabolic panel does show evidence of hyperglycemia, elevated creatinine and bilirubin [JK]  2058 CT scan without acute findings [JK]  2116 Patient attempted to ambulate.  Felt very weak.  Unable to do so. [JK]  2117 Patient's labs are notable for progressive anemia as well as thrombocytopenia [JK]  2117 Patient's Hemoccult is negative.  No sign of acute GI bleeding [JK]    Clinical Course User Index [JK] Dorie Rank, MD   MDM Rules/Calculators/A&P                          Patient presented to the ED for evaluation of progressive weakness.  Patient had good muscle strength but as soon as he tried to stand he started feel more short of breath and weak.  Patient's initial laboratory are notable for slightly increasing renal insufficiency.  He also has hyperglycemia and slight increase in his bilirubin.  Patient's hemoglobin continues to decrease however.  Months ago his hemoglobin was 10 but 5 days ago was 8.3.  It has declined even further down to 7.4.  He also had new onset thrombocytopenia 5 days ago.  It has worsened today.  No evidence of blood in his stool.  I suspect this could be a primary hematologic issue.  I will consult with the medical service for admission and further evaluation. Final Clinical Impression(s) / ED Diagnoses Final diagnoses:  Anemia, unspecified type  Thrombocytopenia ([place])      Dorie Rank, MD 06/09/2020 2146

## 2020-06-09 DIAGNOSIS — D649 Anemia, unspecified: Secondary | ICD-10-CM | POA: Diagnosis not present

## 2020-06-09 DIAGNOSIS — N1831 Chronic kidney disease, stage 3a: Secondary | ICD-10-CM | POA: Diagnosis not present

## 2020-06-09 DIAGNOSIS — R5381 Other malaise: Secondary | ICD-10-CM | POA: Diagnosis not present

## 2020-06-09 DIAGNOSIS — E119 Type 2 diabetes mellitus without complications: Secondary | ICD-10-CM | POA: Diagnosis not present

## 2020-06-09 LAB — COMPREHENSIVE METABOLIC PANEL
ALT: 20 U/L (ref 0–44)
AST: 20 U/L (ref 15–41)
Albumin: 3.3 g/dL — ABNORMAL LOW (ref 3.5–5.0)
Alkaline Phosphatase: 88 U/L (ref 38–126)
Anion gap: 11 (ref 5–15)
BUN: 34 mg/dL — ABNORMAL HIGH (ref 8–23)
CO2: 24 mmol/L (ref 22–32)
Calcium: 7.9 mg/dL — ABNORMAL LOW (ref 8.9–10.3)
Chloride: 110 mmol/L (ref 98–111)
Creatinine, Ser: 1.69 mg/dL — ABNORMAL HIGH (ref 0.61–1.24)
GFR, Estimated: 41 mL/min — ABNORMAL LOW (ref >60)
Glucose, Bld: 242 mg/dL — ABNORMAL HIGH (ref 70–99)
Potassium: 3.8 mmol/L (ref 3.5–5.1)
Sodium: 145 mmol/L (ref 135–145)
Total Bilirubin: 1.4 mg/dL — ABNORMAL HIGH (ref 0.3–1.2)
Total Protein: 5.8 g/dL — ABNORMAL LOW (ref 6.5–8.1)

## 2020-06-09 LAB — CBC
HCT: 25.6 % — ABNORMAL LOW (ref 39.0–52.0)
Hemoglobin: 7.3 g/dL — ABNORMAL LOW (ref 13.0–17.0)
MCH: 29.2 pg (ref 26.0–34.0)
MCHC: 28.5 g/dL — ABNORMAL LOW (ref 30.0–36.0)
MCV: 102.4 fL — ABNORMAL HIGH (ref 80.0–100.0)
Platelets: 35 10*3/uL — ABNORMAL LOW (ref 150–400)
RBC: 2.5 MIL/uL — ABNORMAL LOW (ref 4.22–5.81)
RDW: 19.5 % — ABNORMAL HIGH (ref 11.5–15.5)
WBC: 13 10*3/uL — ABNORMAL HIGH (ref 4.0–10.5)
nRBC: 1.2 % — ABNORMAL HIGH (ref 0.0–0.2)

## 2020-06-09 LAB — IRON AND TIBC
Iron: 55 ug/dL (ref 45–182)
Saturation Ratios: 27 % (ref 17.9–39.5)
TIBC: 207 ug/dL — ABNORMAL LOW (ref 250–450)
UIBC: 152 ug/dL

## 2020-06-09 LAB — MAGNESIUM: Magnesium: 2.6 mg/dL — ABNORMAL HIGH (ref 1.7–2.4)

## 2020-06-09 LAB — GLUCOSE, CAPILLARY
Glucose-Capillary: 105 mg/dL — ABNORMAL HIGH (ref 70–99)
Glucose-Capillary: 204 mg/dL — ABNORMAL HIGH (ref 70–99)
Glucose-Capillary: 216 mg/dL — ABNORMAL HIGH (ref 70–99)
Glucose-Capillary: 182 mg/dL — ABNORMAL HIGH (ref 70–99)
Glucose-Capillary: 187 mg/dL — ABNORMAL HIGH (ref 70–99)

## 2020-06-09 LAB — VITAMIN B12: Vitamin B-12: 1840 pg/mL — ABNORMAL HIGH (ref 180–914)

## 2020-06-09 LAB — PREPARE RBC (CROSSMATCH)

## 2020-06-09 LAB — FERRITIN: Ferritin: 309 ng/mL (ref 24–336)

## 2020-06-09 LAB — APTT: aPTT: 36 seconds (ref 24–36)

## 2020-06-09 LAB — CK: Total CK: 52 U/L (ref 49–397)

## 2020-06-09 MED ORDER — PHYTONADIONE 5 MG PO TABS
5.0000 mg | ORAL_TABLET | Freq: Every day | ORAL | Status: AC
  Administered 2020-06-09 – 2020-06-11 (×3): 5 mg via ORAL
  Filled 2020-06-09 (×3): qty 1

## 2020-06-09 MED ORDER — FUROSEMIDE 10 MG/ML IJ SOLN
20.0000 mg | Freq: Once | INTRAMUSCULAR | Status: AC
  Administered 2020-06-09: 20 mg via INTRAVENOUS
  Filled 2020-06-09: qty 2

## 2020-06-09 MED ORDER — SODIUM CHLORIDE 0.9% IV SOLUTION: Freq: Once | INTRAVENOUS | Status: DC

## 2020-06-09 MED ORDER — MELATONIN 3 MG PO TABS
3.0000 mg | ORAL_TABLET | Freq: Every day | ORAL | Status: DC
  Administered 2020-06-09 – 2020-06-13 (×5): 3 mg via ORAL
  Filled 2020-06-09 (×5): qty 1

## 2020-06-09 MED ORDER — FUROSEMIDE 10 MG/ML IJ SOLN: 20.0000 mg | Freq: Once | INTRAMUSCULAR | Status: DC

## 2020-06-09 NOTE — Plan of Care (Signed)
  Problem: Education: Goal: Knowledge of General Education information will improve Description Including pain rating scale, medication(s)/side effects and non-pharmacologic comfort measures Outcome: Progressing   

## 2020-06-09 NOTE — Progress Notes (Addendum)
[facility] PROGRESS NOTE   Steve Maldonado JYN:[id number] DOB: 04-17-1940 DOA: 06/05/2020  PCP: Burnard Bunting, MD  Brief History/Interval Summary: 80 y.o. male with medical history significant for CAD, hypertension, history of CVA, type 2 diabetes mellitus, mild renal insufficiency, and MDS, presented to the emergency department for evaluation of weakness.    Noted to be profoundly anemic.  Patient with dyspnea on exertion.    Consultants: Hematology notified via epic  Procedures: None at this time  Antibiotics: Anti-infectives (From admission, onward)   None      Subjective/Interval History: Patient continues to complain of weakness, fatigue and shortness of breath mainly with exertion.  Denies any chest pain.  No cough.  No nausea or vomiting.  He lives by himself.    Assessment/Plan:  Symptomatic anemia with profound generalized weakness/fatigue Patient symptoms appear to be mostly due to his significant anemia.  He also has dyspnea on exertion.  Lungs are otherwise clear on examination.  He may benefit from blood transfusion.  2 units will be ordered.  Patient lives by himself.  Will need to be seen by PT OT.  He is at high risk for falls or injuries and could be unsafe to go home home unsupervised.  CK level was normal.  FOBT was negative.  History of myelodysplastic syndrome/anemia and thrombocytopenia Followed by Dr. Irene Limbo.  Has not required transfusions previously but appears to be much more symptomatic now.  No bleeding noted.  Will be transfused 2 units of blood as discussed above.  Monitor platelet counts.  Anemia panel did not show any clear-cut deficiencies. Old lab results reviewed. His anemia and thrombocytopenia have worsened since October 2021 (Hgb was 10 and Platelets 159k at that time). Will d/w Dr. Irene Limbo in AM.  Coagulopathy INR was noted to be elevated.  PTT is normal.  Could be due to nutritional deficiencies.  No history of liver disease. Vit  K.  Hypernatremia Came in with sodium of 151.  Was hydrated.  Improved this morning.  Insulin-dependent diabetes mellitus type 2 Monitor CBGs.  SSI.  Noted to be on Lantus which is being continued.  Check HbA1c.  History of coronary artery disease Stable.  Noted to be on statin.  Chronic kidney disease stage IIIa Renal function close to baseline.  Monitor urine output.  Avoid nephrotoxic agents.  Prolonged QT interval QTC 531 ms.  Monitor electrolytes.  Magnesium 2.6.  Potassium 3.8.   DVT Prophylaxis: SCDs only for now Code Status: Full code Family Communication: Discussed with patient.  He reports no family members Disposition Plan: We will need PT and OT evaluation.  Currently he is very weak and fatigued.  Lives by himself.  Unsafe discharge planning at this time.  Status is: Observation  The patient will require care spanning > 2 midnights and should be moved to inpatient because: Unsafe d/c plan, IV treatments appropriate due to intensity of illness or inability to take PO and Inpatient level of care appropriate due to severity of illness   Case referred to [facility] who recommends leaving OBS for now.  Dispo: The patient is from: Home              Anticipated d/c is to: To be determined              Patient currently is not medically stable to d/c.   Difficult to place patient No    Medications:  Scheduled: . sodium chloride   Intravenous Once  . furosemide  20 mg Intravenous Once  . insulin aspart  0-5 Units Subcutaneous QHS  . insulin aspart  0-9 Units Subcutaneous TID WC  . insulin glargine  10 Units Subcutaneous Daily  . rosuvastatin  20 mg Oral QPM   Continuous: . sodium chloride 10 mL/hr at 06/02/2020 2345   DPO:EUMPNT chloride, acetaminophen **OR** acetaminophen, polyethylene glycol   Objective:  Vital Signs  Vitals:   06/09/20 0113 06/09/20 0133 06/09/20 0326 06/09/20 0544  BP: (!) 110/58  116/62 (!) 118/52  Pulse: 87  (!) 101 99  Resp: 18  20 20    Temp: 98.7 F (37.1 C)  98.1 F (36.7 C) 99.1 F (37.3 C)  TempSrc: Oral  Oral Oral  SpO2: 97%  100% 100%  Weight:  77.3 kg    Height:  6\' 3"  (1.905 m)      Intake/Output Summary (Last 24 hours) at 06/09/2020 1041 Last data filed at 06/09/2020 0600 Gross per 24 hour  Intake 975.6 ml  Output 325 ml  Net 650.6 ml   Filed Weights   06/09/20 0133  Weight: 77.3 kg    General appearance: Awake alert.  In no distress Resp: Clear to auscultation bilaterally.  Normal effort Cardio: S1-S2 is normal regular.  No S3-S4.  No rubs murmurs or bruit GI: Abdomen is soft.  Nontender nondistended.  Bowel sounds are present normal.  No masses organomegaly Extremities: No edema.  Full range of motion of lower extremities. Neurologic: Alert and oriented x3.  No focal neurological deficits.    Lab Results:  Data Reviewed: I have personally reviewed following labs and imaging studies  CBC: Recent Labs  Lab 06/03/20 1221 06/13/2020 1824 06/09/20 0539  WBC 8.7 10.8* 13.0*  NEUTROABS 6.0 4.1  --   HGB 8.3* 7.4* 7.3*  HCT 29.2* 26.1* 25.6*  MCV 101.0* 102.0* 102.4*  PLT 49* 31* 35*    Basic Metabolic Panel: Recent Labs  Lab 06/03/20 1221 06/10/2020 1824 06/09/20 0539  NA 142 149* 145  K 4.2 3.4* 3.8  CL 111 112* 110  CO2 22 24 24   GLUCOSE 260* 210* 242*  BUN 37* 32* 34*  CREATININE 1.52* 1.55* 1.69*  CALCIUM 9.5 8.0* 7.9*  MG  --   --  2.6*    GFR: Estimated Creatinine Clearance: 38.1 mL/min (A) (by C-G formula based on SCr of 1.69 mg/dL (H)).  Liver Function Tests: Recent Labs  Lab 06/03/20 1221 06/03/2020 1824 06/09/20 0539  AST 14* 23 20  ALT 14 20 20   ALKPHOS 79 78 88  BILITOT 1.2 1.5* 1.4*  PROT 7.2 6.0* 5.8*  ALBUMIN 4.1 3.4* 3.3*     Coagulation Profile: Recent Labs  Lab 05/22/2020 1824  INR 1.5*    Cardiac Enzymes: Recent Labs  Lab 06/09/20 0539  CKTOTAL 52    CBG: Recent Labs  Lab 06/03/20 1110 06/09/20 0126 06/09/20 0802  GLUCAP 284* 105*  204*    Thyroid Function Tests: Recent Labs    06/02/2020 1826  TSH 2.157    Anemia Panel: Recent Labs    05/31/2020 2206  VITAMINB12 1,840*  FOLATE 6.3  FERRITIN 309  TIBC 207*  IRON 55  RETICCTPCT 0.6    Recent Results (from the past 240 hour(s))  Resp Panel by RT-PCR (Flu A&B, Covid) Nasopharyngeal Swab     Status: None   Collection Time: 05/25/2020 10:27 PM   Specimen: Nasopharyngeal Swab; Nasopharyngeal(NP) swabs in vial transport medium  Result Value Ref Range Status   SARS Coronavirus 2 by  RT PCR NEGATIVE NEGATIVE Final    Comment: (NOTE) SARS-CoV-2 target nucleic acids are NOT DETECTED.  The SARS-CoV-2 RNA is generally detectable in upper respiratory specimens during the acute phase of infection. The lowest concentration of SARS-CoV-2 viral copies this assay can detect is 138 copies/mL. A negative result does not preclude SARS-Cov-2 infection and should not be used as the sole basis for treatment or other patient management decisions. A negative result may occur with  improper specimen collection/handling, submission of specimen other than nasopharyngeal swab, presence of viral mutation(s) within the areas targeted by this assay, and inadequate number of viral copies(<138 copies/mL). A negative result must be combined with clinical observations, patient history, and epidemiological information. The expected result is Negative.  Fact Sheet for Patients:  EntrepreneurPulse.com.au  Fact Sheet for Healthcare Providers:  IncredibleEmployment.be  This test is no t yet approved or cleared by the [place] FDA and  has been authorized for detection and/or diagnosis of SARS-CoV-2 by FDA under an Emergency Use Authorization (EUA). This EUA will remain  in effect (meaning this test can be used) for the duration of the COVID-19 declaration under Section 564(b)(1) of the Act, 21 U.S.C.section 360bbb-3(b)(1), unless the authorization  is terminated  or revoked sooner.       Influenza A by PCR NEGATIVE NEGATIVE Final   Influenza B by PCR NEGATIVE NEGATIVE Final    Comment: (NOTE) The Xpert Xpress SARS-CoV-2/FLU/RSV plus assay is intended as an aid in the diagnosis of influenza from Nasopharyngeal swab specimens and should not be used as a sole basis for treatment. Nasal washings and aspirates are unacceptable for Xpert Xpress SARS-CoV-2/FLU/RSV testing.  Fact Sheet for Patients: EntrepreneurPulse.com.au  Fact Sheet for Healthcare Providers: IncredibleEmployment.be  This test is not yet approved or cleared by the [place] FDA and has been authorized for detection and/or diagnosis of SARS-CoV-2 by FDA under an Emergency Use Authorization (EUA). This EUA will remain in effect (meaning this test can be used) for the duration of the COVID-19 declaration under Section 564(b)(1) of the Act, 21 U.S.C. section 360bbb-3(b)(1), unless the authorization is terminated or revoked.  Performed at [facility], [place] [address] [place], [place] [address]       Radiology Studies: CT HEAD WO CONTRAST  Result Date: 06/10/2020 CLINICAL DATA:  80 year old male with neurologic deficit. EXAM: CT HEAD WITHOUT CONTRAST TECHNIQUE: Contiguous axial images were obtained from the base of the skull through the vertex without intravenous contrast. COMPARISON:  None. FINDINGS: Brain: Mild age-related atrophy and moderate chronic microvascular ischemic changes. Left periventricular and white matter corona radiata hypodensity, chronic. There is no acute intracranial hemorrhage. No mass effect or midline shift. No extra-axial fluid collection. Vascular: No hyperdense vessel or unexpected calcification. Skull: Normal. Negative for fracture or focal lesion. Sinuses/Orbits: No acute finding. Other: None IMPRESSION: 1. No acute intracranial pathology. 2. Age-related atrophy and chronic  microvascular ischemic changes. Electronically Signed   By: Anner Crete M.D.   On: 05/22/2020 20:02   DG Chest Portable 1 View  Result Date: 05/21/2020 CLINICAL DATA:  80 year old male with shortness of breath. EXAM: PORTABLE CHEST 1 VIEW COMPARISON:  Chest radiograph dated 06/03/2020. FINDINGS: No focal consolidation, pleural effusion, or pneumothorax. The cardiac silhouette is within limits. No acute osseous pathology. IMPRESSION: No active disease. Electronically Signed   By: Anner Crete M.D.   On: 06/14/2020 21:56       LOS: 0 days   [facility] Pager on [email or web address]  06/09/2020, 10:41 AM

## 2020-06-09 NOTE — Plan of Care (Signed)

## 2020-06-09 NOTE — Progress Notes (Signed)
PT Cancellation Note  Patient Details Name: Steve Maldonado MRN: [id number] DOB: 1940/05/26   Cancelled Treatment:     PT order received but evaluation deferred - pt with Hgb of 7.3 and 2 unit RBC transfusion being initiated.  Will follow.   Jeanne Terrance 06/09/2020, 11:49 AM

## 2020-06-09 NOTE — ED Notes (Signed)
ED TO INPATIENT HANDOFF REPORT  Name/Age/Gender Steve Maldonado 80 y.o. male  Code Status    Code Status Orders  (From admission, onward)         Start     Ordered   06/01/2020 2310  Full code  Continuous        06/06/2020 2314        Code Status History    Date Active Date Inactive Code Status Order ID Comments User Context   02/17/2018 2253 02/19/2018 2224 Full Code [id number]  Rise Patience, MD Inpatient   12/10/2016 3267 12/10/2016 1624 Full Code [id number]  Nigel Mormon, MD Inpatient   Advance Care Planning Activity    Advance Directive Documentation   Flowsheet Row Most Recent Value  Type of Advance Directive Healthcare Power of Attorney, Living will  Pre-existing out of facility DNR order (yellow form or pink MOST form) --  "MOST" Form in Place? --      Home/SNF/Other Home  Chief Complaint Anemia [D64.9]  Level of Care/Admitting Diagnosis ED Disposition    ED Disposition Condition [place]: [place] [100102]  Level of Care: Telemetry [5]  Admit to tele based on following criteria: Monitor QTC interval  Covid Evaluation: Asymptomatic Screening Protocol (No Symptoms)  Diagnosis: Anemia [338250]  Admitting Physician: Vianne Bulls [[id number]]  Attending Physician: Vianne Bulls [[id number]]       Medical History Past Medical History:  Diagnosis Date  . Diabetes mellitus without complication ([place])   . Hyperlipidemia   . Hypertension   . Stroke [facility]) 1970    Allergies No Known Allergies  IV Location/Drains/Wounds Patient Lines/Drains/Airways Status    Active Line/Drains/Airways    Name Placement date Placement time Site Days   Peripheral IV 06/18/2020 Right Antecubital 06/06/2020  --  Antecubital  1   Sheath 12/10/16 Right Arterial;Radial 12/10/16  0746  Arterial;Radial  2409          Labs/Imaging Results for orders placed or performed during the hospital encounter of 06/18/2020 (from the past  48 hour(s))  Ethanol     Status: None   Collection Time: 06/06/2020  6:24 PM  Result Value Ref Range   Alcohol, Ethyl (B) <10 <10 mg/dL    Comment: (NOTE) Lowest detectable limit for serum alcohol is 10 mg/dL.  For medical purposes only. Performed at [facility], [place] [address], [place], [place] [address]   Protime-INR     Status: Abnormal   Collection Time: 06/05/2020  6:24 PM  Result Value Ref Range   Prothrombin Time 17.4 (H) 11.4 - 15.2 seconds   INR 1.5 (H) 0.8 - 1.2    Comment: (NOTE) INR goal varies based on device and disease states. Performed at [facility], [place] [address], [place], [place] [address]   APTT     Status: None   Collection Time: 05/22/2020  6:24 PM  Result Value Ref Range   aPTT 33 24 - 36 seconds    Comment: Performed at [facility], [place] [address], [place], [place] [address]  CBC     Status: Abnormal   Collection Time: 06/14/2020  6:24 PM  Result Value Ref Range   WBC 10.8 (H) 4.0 - 10.5 K/uL   RBC 2.56 (L) 4.22 - 5.81 MIL/uL   Hemoglobin 7.4 (L) 13.0 - 17.0 g/dL   HCT 26.1 (L) 39.0 - 52.0 %   MCV 102.0 (H) 80.0 - 100.0  fL   MCH 28.9 26.0 - 34.0 pg   MCHC 28.4 (L) 30.0 - 36.0 g/dL   RDW 19.2 (H) 11.5 - 15.5 %   Platelets 31 (L) 150 - 400 K/uL    Comment: SPECIMEN CHECKED FOR CLOTS Immature Platelet Fraction may be clinically indicated, consider ordering this additional test [id number] REPEATED TO VERIFY PLATELET COUNT CONFIRMED BY SMEAR    nRBC 1.2 (H) 0.0 - 0.2 %    Comment: Performed at [facility], [place] [address] [place], [place] [address]  Differential     Status: Abnormal   Collection Time: 05/21/2020  6:24 PM  Result Value Ref Range   Neutrophils Relative % 37 %   Neutro Abs 4.1 1.7 - 7.7 K/uL   Lymphocytes Relative 29 %   Lymphs Abs 3.1 0.7 - 4.0 K/uL   Monocytes Relative 23 %   Monocytes Absolute 2.4 (H) 0.1 - 1.0 K/uL   Eosinophils Relative 0 %    Eosinophils Absolute 0.0 0.0 - 0.5 K/uL   Basophils Relative 1 %   Basophils Absolute 0.1 0.0 - 0.1 K/uL   WBC Morphology      MODERATE LEFT SHIFT (>5% METAS AND MYELOS,OCC PRO NOTED)   Immature Granulocytes 10 %   Abs Immature Granulocytes 1.09 (H) 0.00 - 0.07 K/uL   Schistocytes PRESENT    Tear Drop Cells PRESENT    Giant PLTs PRESENT     Comment: Performed at [facility], [place] [address] [place], [place] [address]  Comprehensive metabolic panel     Status: Abnormal   Collection Time: 06/19/2020  6:24 PM  Result Value Ref Range   Sodium 149 (H) 135 - 145 mmol/L   Potassium 3.4 (L) 3.5 - 5.1 mmol/L   Chloride 112 (H) 98 - 111 mmol/L   CO2 24 22 - 32 mmol/L   Glucose, Bld 210 (H) 70 - 99 mg/dL    Comment: Glucose reference range applies only to samples taken after fasting for at least 8 hours.   BUN 32 (H) 8 - 23 mg/dL   Creatinine, Ser 1.55 (H) 0.61 - 1.24 mg/dL   Calcium 8.0 (L) 8.9 - 10.3 mg/dL   Total Protein 6.0 (L) 6.5 - 8.1 g/dL   Albumin 3.4 (L) 3.5 - 5.0 g/dL   AST 23 15 - 41 U/L   ALT 20 0 - 44 U/L   Alkaline Phosphatase 78 38 - 126 U/L   Total Bilirubin 1.5 (H) 0.3 - 1.2 mg/dL   GFR, Estimated 45 (L) >60 mL/min    Comment: (NOTE) Calculated using the CKD-EPI Creatinine Equation (2021)    Anion gap 13 5 - 15    Comment: Performed at [facility], [place] [address] [place], [place] [address]  TSH     Status: None   Collection Time: 05/31/2020  6:26 PM  Result Value Ref Range   TSH 2.157 0.350 - 4.500 uIU/mL    Comment: Performed by a 3rd Generation assay with a functional sensitivity of <=0.01 uIU/mL. Performed at [facility], [place] [address] [place], [place] [address]   Urine rapid drug screen (hosp performed)not at [facility]     Status: None   Collection Time: 06/06/2020  7:34 PM  Result Value Ref Range   Opiates NONE DETECTED NONE DETECTED   Cocaine NONE DETECTED NONE DETECTED   Benzodiazepines NONE DETECTED NONE  DETECTED   Amphetamines NONE DETECTED NONE DETECTED   Tetrahydrocannabinol NONE DETECTED NONE DETECTED  Barbiturates NONE DETECTED NONE DETECTED    Comment: (NOTE) DRUG SCREEN FOR MEDICAL PURPOSES ONLY.  IF CONFIRMATION IS NEEDED FOR ANY PURPOSE, NOTIFY LAB WITHIN 5 DAYS.  LOWEST DETECTABLE LIMITS FOR URINE DRUG SCREEN Drug Class                     Cutoff (ng/mL) Amphetamine and metabolites    1000 Barbiturate and metabolites    200 Benzodiazepine                 212 Tricyclics and metabolites     300 Opiates and metabolites        300 Cocaine and metabolites        300 THC                            50 Performed at [facility], [place] [address], [place], [place] [address]   Urinalysis, Routine w reflex microscopic     Status: Abnormal   Collection Time: 06/19/2020  7:34 PM  Result Value Ref Range   Color, Urine YELLOW YELLOW   APPearance CLEAR CLEAR   Specific Gravity, Urine 1.018 1.005 - 1.030   pH 8.0 5.0 - 8.0   Glucose, UA >=500 (A) NEGATIVE mg/dL   Hgb urine dipstick NEGATIVE NEGATIVE   Bilirubin Urine NEGATIVE NEGATIVE   Ketones, ur 20 (A) NEGATIVE mg/dL   Protein, ur 30 (A) NEGATIVE mg/dL   Nitrite NEGATIVE NEGATIVE   Leukocytes,Ua NEGATIVE NEGATIVE   RBC / HPF 0-5 0 - 5 RBC/hpf   WBC, UA 0-5 0 - 5 WBC/hpf   Bacteria, UA NONE SEEN NONE SEEN   Squamous Epithelial / LPF 0-5 0 - 5    Comment: Performed at [facility], [place] [address], [place], [place] [address]  POC occult blood, ED     Status: None   Collection Time: 06/05/2020  9:33 PM  Result Value Ref Range   Fecal Occult Bld NEGATIVE NEGATIVE  Type and screen [place]     Status: None (Preliminary result)   Collection Time: 06/01/2020 10:06 PM  Result Value Ref Range   ABO/RH(D) PENDING    Antibody Screen      NEG Performed at [facility], [place] [address] [place], [place] [address]    Sample Expiration PENDING   Folate      Status: None   Collection Time: 05/29/2020 10:06 PM  Result Value Ref Range   Folate 6.3 >5.9 ng/mL    Comment: Performed at [facility], [place] [address] [place], [place] [address]  Iron and TIBC     Status: Abnormal   Collection Time: 05/21/2020 10:06 PM  Result Value Ref Range   Iron 55 45 - 182 ug/dL   TIBC 207 (L) 250 - 450 ug/dL   Saturation Ratios 27 17.9 - 39.5 %   UIBC 152 ug/dL    Comment: Performed at [facility], [place] [address] [place], [place] [address]  Ferritin     Status: None   Collection Time: 06/01/2020 10:06 PM  Result Value Ref Range   Ferritin 309 24 - 336 ng/mL    Comment: Performed at [facility], [place] [address], [place], [place] [address]  Reticulocytes     Status: Abnormal   Collection Time: 06/20/2020 10:06 PM  Result Value Ref Range   Retic Ct Pct 0.6 0.4 -  3.1 %   RBC. 2.13 (L) 4.22 - 5.81 MIL/uL   Retic Count, Absolute 13.4 (L) 19.0 - 186.0 K/uL   Immature Retic Fract 23.4 (H) 2.3 - 15.9 %    Comment: Performed at [facility], [place] [address] [place], [place] [address]  Resp Panel by RT-PCR (Flu A&B, Covid) Nasopharyngeal Swab     Status: None   Collection Time: 06/19/2020 10:27 PM   Specimen: Nasopharyngeal Swab; Nasopharyngeal(NP) swabs in vial transport medium  Result Value Ref Range   SARS Coronavirus 2 by RT PCR NEGATIVE NEGATIVE    Comment: (NOTE) SARS-CoV-2 target nucleic acids are NOT DETECTED.  The SARS-CoV-2 RNA is generally detectable in upper respiratory specimens during the acute phase of infection. The lowest concentration of SARS-CoV-2 viral copies this assay can detect is 138 copies/mL. A negative result does not preclude SARS-Cov-2 infection and should not be used as the sole basis for treatment or other patient management decisions. A negative result may occur with  improper specimen collection/handling, submission of specimen other than nasopharyngeal swab,  presence of viral mutation(s) within the areas targeted by this assay, and inadequate number of viral copies(<138 copies/mL). A negative result must be combined with clinical observations, patient history, and epidemiological information. The expected result is Negative.  Fact Sheet for Patients:  EntrepreneurPulse.com.au  Fact Sheet for Healthcare Providers:  IncredibleEmployment.be  This test is no t yet approved or cleared by the [place] FDA and  has been authorized for detection and/or diagnosis of SARS-CoV-2 by FDA under an Emergency Use Authorization (EUA). This EUA will remain  in effect (meaning this test can be used) for the duration of the COVID-19 declaration under Section 564(b)(1) of the Act, 21 U.S.C.section 360bbb-3(b)(1), unless the authorization is terminated  or revoked sooner.       Influenza A by PCR NEGATIVE NEGATIVE   Influenza B by PCR NEGATIVE NEGATIVE    Comment: (NOTE) The Xpert Xpress SARS-CoV-2/FLU/RSV plus assay is intended as an aid in the diagnosis of influenza from Nasopharyngeal swab specimens and should not be used as a sole basis for treatment. Nasal washings and aspirates are unacceptable for Xpert Xpress SARS-CoV-2/FLU/RSV testing.  Fact Sheet for Patients: EntrepreneurPulse.com.au  Fact Sheet for Healthcare Providers: IncredibleEmployment.be  This test is not yet approved or cleared by the [place] FDA and has been authorized for detection and/or diagnosis of SARS-CoV-2 by FDA under an Emergency Use Authorization (EUA). This EUA will remain in effect (meaning this test can be used) for the duration of the COVID-19 declaration under Section 564(b)(1) of the Act, 21 U.S.C. section 360bbb-3(b)(1), unless the authorization is terminated or revoked.  Performed at [facility], [place] [address] [place] [address], [place], [place] [address]    CT HEAD WO  CONTRAST  Result Date: 06/19/2020 CLINICAL DATA:  80 year old male with neurologic deficit. EXAM: CT HEAD WITHOUT CONTRAST TECHNIQUE: Contiguous axial images were obtained from the base of the skull through the vertex without intravenous contrast. COMPARISON:  None. FINDINGS: Brain: Mild age-related atrophy and moderate chronic microvascular ischemic changes. Left periventricular and white matter corona radiata hypodensity, chronic. There is no acute intracranial hemorrhage. No mass effect or midline shift. No extra-axial fluid collection. Vascular: No hyperdense vessel or unexpected calcification. Skull: Normal. Negative for fracture or focal lesion. Sinuses/Orbits: No acute finding. Other: None IMPRESSION: 1. No acute intracranial pathology. 2. Age-related atrophy and chronic microvascular ischemic changes. Electronically Signed   By: Anner Crete M.D.   On: 06/17/2020  20:02   DG Chest Portable 1 View  Result Date: 05/31/2020 CLINICAL DATA:  80 year old male with shortness of breath. EXAM: PORTABLE CHEST 1 VIEW COMPARISON:  Chest radiograph dated 06/03/2020. FINDINGS: No focal consolidation, pleural effusion, or pneumothorax. The cardiac silhouette is within limits. No acute osseous pathology. IMPRESSION: No active disease. Electronically Signed   By: Anner Crete M.D.   On: 06/14/2020 21:56    Pending Labs Unresulted Labs (From admission, onward)          Start     Ordered   06/09/20 0500  Hemoglobin A1c  Tomorrow morning,   R       Comments: To assess prior glycemic control    05/23/2020 2314   06/09/20 0500  Comprehensive metabolic panel  Tomorrow morning,   R        06/17/2020 2314   06/09/20 0500  CBC  Tomorrow morning,   R        06/14/2020 2314   06/07/2020 2314  APTT  Once,   STAT        05/23/2020 2314   05/26/2020 2312  Magnesium  Once,   STAT        06/16/2020 2314   05/24/2020 2312  CK  Once,   STAT        05/27/2020 2314   05/22/2020 2146  Vitamin B12  (Anemia Panel (PNL))  ONCE -  STAT,   STAT        06/04/2020 2146          Vitals/Pain Today's Vitals   06/18/2020 2230 06/02/2020 2300 06/12/2020 2330 06/10/2020 2335  BP: (!) 106/59 (!) 102/54 (!) 81/69 (!) 89/66  Pulse: 88 85 89 95  Resp: 20     Temp:      TempSrc:      SpO2: 93% 91% 91% 93%  PainSc:        Isolation Precautions No active isolations  Medications Medications  rosuvastatin (CRESTOR) tablet 20 mg (has no administration in time range)  insulin glargine (LANTUS) injection 10 Units (has no administration in time range)  insulin aspart (novoLOG) injection 0-9 Units (has no administration in time range)  insulin aspart (novoLOG) injection 0-5 Units (has no administration in time range)  0.45 % NaCl with KCl 20 mEq / L infusion (has no administration in time range)  acetaminophen (TYLENOL) tablet 650 mg (has no administration in time range)    Or  acetaminophen (TYLENOL) suppository 650 mg (has no administration in time range)  polyethylene glycol (MIRALAX / GLYCOLAX) packet 17 g (has no administration in time range)  magnesium sulfate IVPB 1 g 100 mL (1 g Intravenous New Bag/Given 05/26/2020 2347)  potassium chloride (KLOR-CON) CR tablet 10 mEq (has no administration in time range)  0.9 %  sodium chloride infusion ( Intravenous New Bag/Given 06/07/2020 2345)  lactated ringers bolus 1,000 mL (0 mLs Intravenous Stopped 06/14/2020 2254)  sodium chloride 0.9 % bolus 500 mL (500 mLs Intravenous New Bag/Given 05/30/2020 2347)    Mobility walks

## 2020-06-09 NOTE — Progress Notes (Signed)
OT Cancellation Note  Patient Details Name: Steve Maldonado MRN: [id number] DOB: Jan 07, 1941   Cancelled Treatment:    Reason Eval/Treat Not Completed: Medical issues which prohibited therapy: Pt is eating lunch and just starting 2nd unit of PRBCs.  Will attempt OT Evaluation tomorrow as able.   Julien Girt 06/09/2020, 1:46 PM

## 2020-06-10 DIAGNOSIS — C9202 Acute myeloblastic leukemia, in relapse: Secondary | ICD-10-CM | POA: Diagnosis not present

## 2020-06-10 DIAGNOSIS — D649 Anemia, unspecified: Secondary | ICD-10-CM

## 2020-06-10 DIAGNOSIS — Z66 Do not resuscitate: Secondary | ICD-10-CM | POA: Diagnosis not present

## 2020-06-10 DIAGNOSIS — H9190 Unspecified hearing loss, unspecified ear: Secondary | ICD-10-CM | POA: Diagnosis present

## 2020-06-10 DIAGNOSIS — D631 Anemia in chronic kidney disease: Secondary | ICD-10-CM | POA: Diagnosis present

## 2020-06-10 DIAGNOSIS — R9389 Abnormal findings on diagnostic imaging of other specified body structures: Secondary | ICD-10-CM | POA: Diagnosis not present

## 2020-06-10 DIAGNOSIS — R0600 Dyspnea, unspecified: Secondary | ICD-10-CM | POA: Diagnosis not present

## 2020-06-10 DIAGNOSIS — R791 Abnormal coagulation profile: Secondary | ICD-10-CM | POA: Diagnosis present

## 2020-06-10 DIAGNOSIS — N179 Acute kidney failure, unspecified: Secondary | ICD-10-CM | POA: Diagnosis present

## 2020-06-10 DIAGNOSIS — R0602 Shortness of breath: Secondary | ICD-10-CM | POA: Diagnosis not present

## 2020-06-10 DIAGNOSIS — D61818 Other pancytopenia: Secondary | ICD-10-CM

## 2020-06-10 DIAGNOSIS — C9200 Acute myeloblastic leukemia, not having achieved remission: Secondary | ICD-10-CM | POA: Diagnosis present

## 2020-06-10 DIAGNOSIS — I2510 Atherosclerotic heart disease of native coronary artery without angina pectoris: Secondary | ICD-10-CM | POA: Diagnosis present

## 2020-06-10 DIAGNOSIS — Z515 Encounter for palliative care: Secondary | ICD-10-CM | POA: Diagnosis not present

## 2020-06-10 DIAGNOSIS — D696 Thrombocytopenia, unspecified: Secondary | ICD-10-CM | POA: Diagnosis not present

## 2020-06-10 DIAGNOSIS — D689 Coagulation defect, unspecified: Secondary | ICD-10-CM | POA: Diagnosis not present

## 2020-06-10 DIAGNOSIS — R17 Unspecified jaundice: Secondary | ICD-10-CM | POA: Diagnosis not present

## 2020-06-10 DIAGNOSIS — R5381 Other malaise: Secondary | ICD-10-CM | POA: Diagnosis present

## 2020-06-10 DIAGNOSIS — R634 Abnormal weight loss: Secondary | ICD-10-CM | POA: Diagnosis present

## 2020-06-10 DIAGNOSIS — J9601 Acute respiratory failure with hypoxia: Secondary | ICD-10-CM | POA: Diagnosis not present

## 2020-06-10 DIAGNOSIS — Z20822 Contact with and (suspected) exposure to covid-19: Secondary | ICD-10-CM | POA: Diagnosis present

## 2020-06-10 DIAGNOSIS — N189 Chronic kidney disease, unspecified: Secondary | ICD-10-CM

## 2020-06-10 DIAGNOSIS — I129 Hypertensive chronic kidney disease with stage 1 through stage 4 chronic kidney disease, or unspecified chronic kidney disease: Secondary | ICD-10-CM | POA: Diagnosis present

## 2020-06-10 DIAGNOSIS — N1831 Chronic kidney disease, stage 3a: Secondary | ICD-10-CM | POA: Diagnosis not present

## 2020-06-10 DIAGNOSIS — E872 Acidosis: Secondary | ICD-10-CM | POA: Diagnosis present

## 2020-06-10 DIAGNOSIS — D539 Nutritional anemia, unspecified: Secondary | ICD-10-CM | POA: Diagnosis present

## 2020-06-10 DIAGNOSIS — K5900 Constipation, unspecified: Secondary | ICD-10-CM | POA: Diagnosis present

## 2020-06-10 DIAGNOSIS — E1122 Type 2 diabetes mellitus with diabetic chronic kidney disease: Secondary | ICD-10-CM | POA: Diagnosis present

## 2020-06-10 DIAGNOSIS — E870 Hyperosmolality and hypernatremia: Secondary | ICD-10-CM | POA: Diagnosis present

## 2020-06-10 DIAGNOSIS — J189 Pneumonia, unspecified organism: Secondary | ICD-10-CM | POA: Diagnosis present

## 2020-06-10 DIAGNOSIS — E1165 Type 2 diabetes mellitus with hyperglycemia: Secondary | ICD-10-CM | POA: Diagnosis present

## 2020-06-10 DIAGNOSIS — Z7189 Other specified counseling: Secondary | ICD-10-CM | POA: Diagnosis not present

## 2020-06-10 LAB — BPAM RBC
Blood Product Expiration Date: 202204142359
Blood Product Expiration Date: 202204152359
ISSUE DATE / TIME: 202203201041
ISSUE DATE / TIME: 202203201328
Unit Type and Rh: 5100
Unit Type and Rh: 5100

## 2020-06-10 LAB — GLUCOSE, CAPILLARY
Glucose-Capillary: 152 mg/dL — ABNORMAL HIGH (ref 70–99)
Glucose-Capillary: 287 mg/dL — ABNORMAL HIGH (ref 70–99)
Glucose-Capillary: 228 mg/dL — ABNORMAL HIGH (ref 70–99)
Glucose-Capillary: 197 mg/dL — ABNORMAL HIGH (ref 70–99)

## 2020-06-10 LAB — DIFFERENTIAL
Abs Immature Granulocytes: 0.54 10*3/uL — ABNORMAL HIGH (ref 0.00–0.07)
Band Neutrophils: 2 %
Basophils Absolute: 0 10*3/uL (ref 0.0–0.1)
Basophils Relative: 0 %
Blasts: 28 %
Eosinophils Absolute: 0 10*3/uL (ref 0.0–0.5)
Eosinophils Relative: 0 %
Immature Granulocytes: 10 %
Lymphocytes Relative: 8 %
Lymphs Abs: 0.9 10*3/uL (ref 0.7–4.0)
Monocytes Absolute: 0.5 10*3/uL (ref 0.1–1.0)
Monocytes Relative: 5 %
Myelocytes: 5 %
Neutro Abs: 5.8 10*3/uL (ref 1.7–7.7)
Neutrophils Relative %: 52 %
nRBC: 2 /100 WBC — ABNORMAL HIGH

## 2020-06-10 LAB — TYPE AND SCREEN
ABO/RH(D): O POS
Antibody Screen: NEGATIVE
Unit division: 0
Unit division: 0

## 2020-06-10 LAB — COMPREHENSIVE METABOLIC PANEL
ALT: 21 U/L (ref 0–44)
AST: 23 U/L (ref 15–41)
Albumin: 3 g/dL — ABNORMAL LOW (ref 3.5–5.0)
Alkaline Phosphatase: 96 U/L (ref 38–126)
Anion gap: 7 (ref 5–15)
BUN: 36 mg/dL — ABNORMAL HIGH (ref 8–23)
CO2: 23 mmol/L (ref 22–32)
Calcium: 8 mg/dL — ABNORMAL LOW (ref 8.9–10.3)
Chloride: 109 mmol/L (ref 98–111)
Creatinine, Ser: 1.8 mg/dL — ABNORMAL HIGH (ref 0.61–1.24)
GFR, Estimated: 38 mL/min — ABNORMAL LOW (ref >60)
Glucose, Bld: 174 mg/dL — ABNORMAL HIGH (ref 70–99)
Potassium: 4.4 mmol/L (ref 3.5–5.1)
Sodium: 139 mmol/L (ref 135–145)
Total Bilirubin: 1.3 mg/dL — ABNORMAL HIGH (ref 0.3–1.2)
Total Protein: 5.4 g/dL — ABNORMAL LOW (ref 6.5–8.1)

## 2020-06-10 LAB — CBC
HCT: 28.2 % — ABNORMAL LOW (ref 39.0–52.0)
Hemoglobin: 8.6 g/dL — ABNORMAL LOW (ref 13.0–17.0)
MCH: 30.2 pg (ref 26.0–34.0)
MCHC: 30.5 g/dL (ref 30.0–36.0)
MCV: 98.9 fL (ref 80.0–100.0)
Platelets: 25 10*3/uL — CL (ref 150–400)
RBC: 2.85 MIL/uL — ABNORMAL LOW (ref 4.22–5.81)
RDW: 18.6 % — ABNORMAL HIGH (ref 11.5–15.5)
WBC: 13.4 10*3/uL — ABNORMAL HIGH (ref 4.0–10.5)
nRBC: 1.2 % — ABNORMAL HIGH (ref 0.0–0.2)

## 2020-06-10 LAB — PATHOLOGIST SMEAR REVIEW

## 2020-06-10 LAB — HEMOGLOBIN A1C
Hgb A1c MFr Bld: 6.3 % — ABNORMAL HIGH (ref 4.8–5.6)
Mean Plasma Glucose: 134 mg/dL

## 2020-06-10 MED ORDER — SODIUM CHLORIDE 0.45 % IV SOLN: INTRAVENOUS | Status: AC

## 2020-06-10 MED ORDER — SENNOSIDES-DOCUSATE SODIUM 8.6-50 MG PO TABS
2.0000 | ORAL_TABLET | Freq: Every day | ORAL | Status: DC
  Administered 2020-06-10 – 2020-06-11 (×2): 2 via ORAL
  Filled 2020-06-10 (×2): qty 2

## 2020-06-10 MED ORDER — POLYETHYLENE GLYCOL 3350 17 G PO PACK
17.0000 g | PACK | Freq: Two times a day (BID) | ORAL | Status: DC
  Administered 2020-06-10 – 2020-06-14 (×8): 17 g via ORAL
  Filled 2020-06-10 (×9): qty 1

## 2020-06-10 NOTE — NC FL2 (Signed)
[place] LEVEL OF CARE SCREENING TOOL     IDENTIFICATION  Patient Name: Steve Maldonado Birthdate: 10/02/1940 Sex: male Admission Date (Current Location): 05/26/2020  [facility] and [place] Number:  Herbalist and Address:  [facility],  [place] [address], [place]      Provider Number: [id number]  Attending Physician Name and Address:  Bonnielee Haff, MD  Relative Name and Phone Number:       Current Level of Care: Hospital Recommended Level of Care: [place] Prior Approval Number:    Date Approved/Denied:   PASRR Number: [id number]  Discharge Plan: SNF    Current Diagnoses: Patient Active Problem List   Diagnosis Date Noted  . Symptomatic anemia 06/10/2020  . Anemia 05/30/2020  . Chronic kidney disease, stage 3a ([place]) 06/01/2020  . Prolonged QT interval 06/07/2020  . Hypernatremia 05/30/2020  . Physical debility 06/06/2020  . Coagulopathy ([place]) 06/02/2020  . Coronary artery disease of native artery of native heart with stable angina pectoris ([place]) 08/26/2018  . Monocytosis   . Orthostatic hypotension 02/18/2018  . Near syncope 02/17/2018  . Hypotension 02/17/2018  . Diabetes mellitus type 2 in nonobese ([place]) 02/17/2018  . Macrocytic anemia 02/17/2018  . Thrombocytopenia ([place]) 02/17/2018  . History of CVA in adulthood 02/17/2018  . Dyspnea on exertion 12/06/2016  . Upper airway cough syndrome 06/08/2013  . MIXED HYPERLIPIDEMIA 11/15/2008  . Essential hypertension 11/15/2008  . CHEST PAIN 11/15/2008    Orientation RESPIRATION BLADDER Height & Weight     Self,Time,Situation,Place  Normal Continent Weight: 77.6 kg Height:  6\' 3"  (190.5 cm)  BEHAVIORAL SYMPTOMS/MOOD NEUROLOGICAL BOWEL NUTRITION STATUS      Continent Diet  AMBULATORY STATUS COMMUNICATION OF NEEDS Skin   Extensive Assist Verbally Normal                       Personal Care Assistance Level of Assistance  Bathing,Dressing,Total  care Bathing Assistance: Maximum assistance   Dressing Assistance: Maximum assistance Total Care Assistance: Maximum assistance   Functional Limitations Info             SPECIAL CARE FACTORS FREQUENCY  PT (By licensed PT),OT (By licensed OT)     PT Frequency: 5x weekly OT Frequency: 5x weekly            Contractures Contractures Info: Not present    Additional Factors Info  Code Status,Allergies,Insulin Sliding Scale Code Status Info: Full Allergies Info: NKDA   Insulin Sliding Scale Info: Novolog slidding scale, Lantus 10 units sq       Current Medications (06/10/2020):  This is the current hospital active medication list Current Facility-Administered Medications  Medication Dose Route Frequency Provider Last Rate Last Admin  . 0.45 % sodium chloride infusion   Intravenous Continuous Bonnielee Haff, MD 100 mL/hr at 06/10/20 0909 New Bag at 06/10/20 0909  . 0.9 %  sodium chloride infusion (Manually program via Guardrails IV Fluids)   Intravenous Once Bonnielee Haff, MD      . 0.9 %  sodium chloride infusion   Intravenous PRN Opyd, Ilene Qua, MD 10 mL/hr at 06/09/2020 2345 New Bag at 06/09/2020 2345  . acetaminophen (TYLENOL) tablet 650 mg  650 mg Oral Q6H PRN Opyd, Ilene Qua, MD       Or  . acetaminophen (TYLENOL) suppository 650 mg  650 mg Rectal Q6H PRN Opyd, Ilene Qua, MD      . insulin aspart (novoLOG) injection 0-5  Units  0-5 Units Subcutaneous QHS Opyd, Timothy S, MD      . insulin aspart (novoLOG) injection 0-9 Units  0-9 Units Subcutaneous TID WC Opyd, Ilene Qua, MD   5 Units at 06/10/20 1155  . insulin glargine (LANTUS) injection 10 Units  10 Units Subcutaneous Daily Opyd, Ilene Qua, MD   10 Units at 06/10/20 0906  . melatonin tablet 3 mg  3 mg Oral QHS Bonnielee Haff, MD   3 mg at 06/09/20 2226  . phytonadione (VITAMIN K) tablet 5 mg  5 mg Oral Daily Bonnielee Haff, MD   5 mg at 06/10/20 0907  . polyethylene glycol (MIRALAX / GLYCOLAX) packet 17 g  17 g Oral  BID Bonnielee Haff, MD   17 g at 06/10/20 1156  . rosuvastatin (CRESTOR) tablet 20 mg  20 mg Oral QPM Opyd, Ilene Qua, MD   20 mg at 06/09/20 1700  . senna-docusate (Senokot-S) tablet 2 tablet  2 tablet Oral QHS Bonnielee Haff, MD         Discharge Medications: Please see discharge summary for a list of discharge medications.  Relevant Imaging Results:  Relevant Lab Results:   Additional Information SSN [id number]  Joaquin Courts, RN

## 2020-06-10 NOTE — Evaluation (Addendum)
Occupational Therapy Evaluation Patient Details Name: Steve Maldonado MRN: [id number] DOB: 07/24/1940 Today's Date: 06/10/2020    History of Present Illness 80 y.o. male with medical history significant for CAD, hypertension, history of CVA, type 2 diabetes mellitus, mild renal insufficiency, and MDS, presented to the emergency department 3/19/22for evaluation of weakness, inability to walk.    Noted to be profoundly anemic.  Received 2 units. Patient with dyspnea on exertion.   Was in ED  06/03/20 for SOB, constipation. weakness.   Clinical Impression   PTA patient was living alone in a private residence with bedroom/bathroom on 2nd floor and was ambulating without AD. Patient currently functioning below baseline requiring Mod A overall for ADLs and +2 assist for functional mobility and ADL transfers. Patient limited by deficits listed below 2/2 diagnosis above and would benefit from continued acute OT services in prep for safe d/c to next level of care. Recommendation for SNF rehab given patient CLOF.     Follow Up Recommendations  SNF;Supervision/Assistance - 24 hour    Equipment Recommendations  Other (comment) (TBD)    Recommendations for Other Services       Precautions / Restrictions Precautions Precautions: Fall Restrictions Weight Bearing Restrictions: No      Mobility Bed Mobility Overal bed mobility: Needs Assistance Bed Mobility: Sit to Supine     Supine to sit: Min assist Sit to supine: Min assist   General bed mobility comments: Min A for controlled descent of trunk. Able to advance BLE from EOB to bed surface with increased time/effort. Requires frequent rest breaks 2/2 decreased activity tolerance.    Transfers Overall transfer level: Needs assistance Equipment used: 1 person hand held assist Transfers: Sit to/from [facility] Sit to Stand: Mod assist Stand pivot transfers: Mod assist       General transfer comment: Mod A for sit to stand  from low recliner with increased time/effort. Patient spontaneously returned to sitting 2/2 weakness. Modified squat-pivot from recliner to EOB with Mod A.    Balance Overall balance assessment: Needs assistance Sitting-balance support: Feet supported;Bilateral upper extremity supported Sitting balance-Leahy Scale: Fair Sitting balance - Comments: Fatigues quickly. Requires frequent rest breaks.   Standing balance support: During functional activity;Bilateral upper extremity supported Standing balance-Leahy Scale: Poor Standing balance comment: Reliant on BUE support.                           ADL either performed or assessed with clinical judgement   ADL Overall ADL's : Needs assistance/impaired Eating/Feeding: Modified independent;Sitting   Grooming: Set up;Sitting Grooming Details (indicate cue type and reason): Able to wash face seated in recliner with set-up assist. Upper Body Bathing: Set up;Sitting Upper Body Bathing Details (indicate cue type and reason): Set-up assist in recliner. Requires rest breaks 2/2 fatigue. Lower Body Bathing: Moderate assistance;Sit to/from stand Lower Body Bathing Details (indicate cue type and reason): Able to wash front perineal area seated in recliner with set-up assist. Would likely require Mod A to wash buttocks in standing. Upper Body Dressing : Set up;Sitting Upper Body Dressing Details (indicate cue type and reason): Donned anterior hospital gown.                   General ADL Comments: Patient limited by generalized weakness, DOE, and increased need for external assist with LB ADLs and ADL transfers.     Vision Baseline Vision/History: Wears glasses Patient Visual Report: No change from baseline  Perception     Praxis      Pertinent Vitals/Pain Pain Assessment: Faces Faces Pain Scale: Hurts a little bit Pain Location: Generalized discomfort Pain Intervention(s): Limited activity within patient's  tolerance;Monitored during session;Repositioned     Hand Dominance Right   Extremity/Trunk Assessment Upper Extremity Assessment Upper Extremity Assessment: Generalized weakness   Lower Extremity Assessment Lower Extremity Assessment: Generalized weakness   Cervical / Trunk Assessment Cervical / Trunk Assessment: Normal   Communication Communication Communication: HOH   Cognition Arousal/Alertness: Awake/alert Behavior During Therapy: WFL for tasks assessed/performed Overall Cognitive Status: Within Functional Limits for tasks assessed                                     General Comments       Exercises     Shoulder Instructions      Home Living Family/patient expects to be discharged to:: Private residence Living Arrangements: Alone   Type of Home: House Home Access: Stairs to enter Technical brewer of Steps: 2 Entrance Stairs-Rails: None Home Layout: Two level;1/2 bath on main level;Bed/bath upstairs Alternate Level Stairs-Number of Steps: ~ 10 Alternate Level Stairs-Rails: Right           Home Equipment: None          Prior Functioning/Environment Level of Independence: Independent        Comments: Recently has not been able to mobilize, crawled to seek help from neighbors. At baseline patient I with ADLs/IADLs. Drives and manages meds independently.        OT Problem List: Decreased strength;Decreased activity tolerance;Impaired balance (sitting and/or standing);Decreased safety awareness;Decreased knowledge of use of DME or AE      OT Treatment/Interventions: Self-care/ADL training;Therapeutic exercise;Energy conservation;DME and/or AE instruction;Therapeutic activities;Patient/family education;Balance training    OT Goals(Current goals can be found in the care plan section) Acute Rehab OT Goals Patient Stated Goal: To get stronger OT Goal Formulation: With patient Time For Goal Achievement: 06/24/20 Potential to Achieve  Goals: Good ADL Goals Pt Will Perform Grooming: with modified independence;standing Pt Will Perform Upper Body Dressing: with modified independence;sitting Pt Will Perform Lower Body Dressing: with modified independence;sit to/from stand Pt Will Transfer to Toilet: with modified independence;ambulating Pt Will Perform Toileting - Clothing Manipulation and hygiene: with modified independence;sit to/from stand Additional ADL Goal #1: Patient will recall and demo 3 energy conservation techniques in prep for ADLs.  OT Frequency: Min 2X/week   Barriers to D/C: Decreased caregiver support  Lives alone       Co-evaluation              AM-PAC OT "6 Clicks" Daily Activity     Outcome Measure Help from another person eating meals?: None Help from another person taking care of personal grooming?: A Little Help from another person toileting, which includes using toliet, bedpan, or urinal?: A Lot Help from another person bathing (including washing, rinsing, drying)?: A Lot Help from another person to put on and taking off regular upper body clothing?: A Little Help from another person to put on and taking off regular lower body clothing?: A Lot 6 Click Score: 16   End of Session Equipment Utilized During Treatment: Gait belt;Oxygen Nurse Communication: Mobility status  Activity Tolerance: Patient limited by fatigue Patient left: in bed;with call bell/phone within reach;with bed alarm set  OT Visit Diagnosis: Unsteadiness on feet (R26.81);Other abnormalities of gait and mobility (R26.89);Muscle weakness (generalized) (  M62.81)                Time: 9417-4081 OT Time Calculation (min): 16 min Charges:  OT General Charges $OT Visit: 1 Visit OT Evaluation $OT Eval Moderate Complexity: 1 Mod  Destanae H. OTR/L Supplemental OT, Department of rehab services [phone]  Destanae R H. 06/10/2020, 10:34 AM

## 2020-06-10 NOTE — Progress Notes (Addendum)
[facility] PROGRESS NOTE   Steve Maldonado BDZ:[id number] DOB: [birth year] DOA: 05/25/2020  PCP: Burnard Bunting, MD  Brief History/Interval Summary: 80 y.o. male with medical history significant for CAD, hypertension, history of CVA, type 2 diabetes mellitus, mild renal insufficiency, and MDS, presented to the emergency department for evaluation of weakness.    Noted to be profoundly anemic.  Patient with dyspnea on exertion.    Consultants: Hematology notified via epic  Procedures: None at this time  Antibiotics: Anti-infectives (From admission, onward)   None      Subjective/Interval History: Patient does not note any significant improvement after his blood transfusion.  Continues to have generalized weakness and fatigue.  Denies any chest pain or shortness of breath.  Is been a few days since he has had his bowel movement.  He mentioned that he required an enema few days ago.     Assessment/Plan:  Symptomatic anemia with profound generalized weakness/fatigue Patient has generalized weakness thought to be due to symptomatic anemia as well as progression of his myelodysplastic syndrome.  Patient was noted to have low-grade fever last night which could have been due to his blood transfusion.  No overt infections identified.  Patient was transfused 2 units of blood yesterday.  PT and OT evaluation.  CK level was normal.  FOBT was negative.  Patient has not noticed any overt bleeding.  SNF is recommended.  Patient lives by himself and is at very high risk for falls based on history as well as examination.  Unsafe for discharge home at this time.  History of myelodysplastic syndrome/anemia and thrombocytopenia Followed by Dr. Irene Limbo.  Has not required transfusions previously but appears to be much more symptomatic now.  No bleeding noted.  Patient was transfused 2 units of PRBC yesterday.  CBC is pending from this morning.  Anemia panel did not show any clear-cut deficiencies. Old  lab results reviewed. His anemia and thrombocytopenia have worsened since October 2021 (Hgb was 10 and Platelets 159k at that time).  Discussed with Dr. Barron Alvine who will evaluate patient.  He mentioned that patient may need to undergo repeat bone marrow to make sure there is no transformation into a different kind of hematological problem. 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  Will communicate results to Dr. Irene Limbo.  Coagulopathy INR was noted to be elevated.  PTT is normal.  Could be due to nutritional deficiencies.  No history of liver disease. Vit K has been ordered.  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  Came in with creatinine of 1.5-1.6.  Has worsened in the last 48 hours.  BUN is also climbing.  We will give him gentle IV hydration.  Monitor urine output.  Avoid nephrotoxic agents.  Recheck labs tomorrow.    Hypernatremia Came in with sodium of 151.  Was hydrated.  Sodium level has improved.  Insulin-dependent diabetes mellitus type 2 HbA1c 6.3.  Patient remains on Lantus and SSI.  CBGs are reasonably well controlled.    History of coronary artery disease Stable.  Noted to be on statin.  Prolonged QT interval QTC 531 ms.  Monitor electrolytes.  Magnesium 2.6.  Potassium 3.8.   DVT Prophylaxis: SCDs only for now Code Status: Full code Family Communication: Discussed with patient.  He reports no family members Disposition Plan: Patient remains weak  and fatigued.  Seen by PT who recommending SNF.  Patient has been self and is unsafe for discharge home in his current condition.  He may also need further hematological work-up in the hospital.    Status is: Observation  The patient will require care spanning > 2 midnights and should be moved to inpatient because: Unsafe d/c plan, IV treatments  appropriate due to intensity of illness or inability to take PO and Inpatient level of care appropriate due to severity of illness   Case [facility] to [facility] on 3/20 who recommends leaving OBS for now.  Dispo: The patient is from: Home              Anticipated d/c is to: To be determined              Patient currently is not medically stable to d/c.   Difficult to place patient No    Medications:  Scheduled: . sodium chloride   Intravenous Once  . insulin aspart  0-5 Units Subcutaneous QHS  . insulin aspart  0-9 Units Subcutaneous TID WC  . insulin glargine  10 Units Subcutaneous Daily  . melatonin  3 mg Oral QHS  . phytonadione  5 mg Oral Daily  . rosuvastatin  20 mg Oral QPM   Continuous: . sodium chloride 100 mL/hr at 06/10/20 0909  . sodium chloride 10 mL/hr at 06/20/2020 2345   VOH:YWVPXT chloride, acetaminophen **OR** acetaminophen, polyethylene glycol   Objective:  Vital Signs  Vitals:   06/09/20 1629 06/09/20 2044 06/10/20 0500 06/10/20 0541  BP: 120/66 111/63  115/67  Pulse: 89 88  80  Resp: (!) 22 20  18   Temp: 99.2 F (37.3 C) (!) 100.7 F (38.2 C)  98.9 F (37.2 C)  TempSrc: Oral Oral  Oral  SpO2: 100% 96%  92%  Weight:   77.6 kg   Height:        Intake/Output Summary (Last 24 hours) at 06/10/2020 0947 Last data filed at 06/10/2020 0600 Gross per 24 hour  Intake 1542 ml  Output 3650 ml  Net -2108 ml   Filed Weights   06/09/20 0133 06/10/20 0500  Weight: 77.3 kg 77.6 kg    General appearance: Awake alert.  In no distress.  Looks fatigued. Resp: Clear to auscultation bilaterally.  Normal effort Cardio: S1-S2 is normal regular.  No S3-S4.  No rubs murmurs or bruit GI: Abdomen is soft.  Nontender nondistended.  Bowel sounds are present normal.  No masses organomegaly Extremities: No edema.  Able to move his extremities.  Physical deconditioning is noted.. Neurologic: Alert and oriented x3.  No focal neurological deficits.     Lab Results:  Data  Reviewed: I have personally reviewed following labs and imaging studies  CBC: Recent Labs  Lab 06/03/20 1221 05/23/2020 1824 06/09/20 0539  WBC 8.7 10.8* 13.0*  NEUTROABS 6.0 4.1  --   HGB 8.3* 7.4* 7.3*  HCT 29.2* 26.1* 25.6*  MCV 101.0* 102.0* 102.4*  PLT 49* 31* 35*    Basic Metabolic Panel: Recent Labs  Lab 06/03/20 1221 05/22/2020 1824 06/09/20 0539 06/10/20 0457  NA 142 149* 145 139  K 4.2 3.4* 3.8 4.4  CL 111 112* 110 109  CO2 22 24 24 23   GLUCOSE 260* 210* 242* 174*  BUN 37* 32* 34* 36*  CREATININE 1.52* 1.55* 1.69* 1.80*  CALCIUM 9.5 8.0* 7.9* 8.0*  MG  --   --  2.6*  --     GFR: Estimated Creatinine  Clearance: 35.9 mL/min (A) (by C-G formula based on SCr of 1.8 mg/dL (H)).  Liver Function Tests: Recent Labs  Lab 06/03/20 1221 06/11/2020 1824 06/09/20 0539 06/10/20 0457  AST 14* 23 20 23   ALT 14 20 20 21   ALKPHOS 79 78 88 96  BILITOT 1.2 1.5* 1.4* 1.3*  PROT 7.2 6.0* 5.8* 5.4*  ALBUMIN 4.1 3.4* 3.3* 3.0*     Coagulation Profile: Recent Labs  Lab 06/14/2020 1824  INR 1.5*    Cardiac Enzymes: Recent Labs  Lab 06/09/20 0539  CKTOTAL 52    CBG: Recent Labs  Lab 06/09/20 0802 06/09/20 1239 06/09/20 1625 06/09/20 2149 06/10/20 0725  GLUCAP 204* 216* 182* 187* 152*    Thyroid Function Tests: Recent Labs    05/24/2020 1826  TSH 2.157    Anemia Panel: Recent Labs    06/07/2020 2206  VITAMINB12 1,840*  FOLATE 6.3  FERRITIN 309  TIBC 207*  IRON 55  RETICCTPCT 0.6    Recent Results (from the past 240 hour(s))  Resp Panel by RT-PCR (Flu A&B, Covid) Nasopharyngeal Swab     Status: None   Collection Time: 06/07/2020 10:27 PM   Specimen: Nasopharyngeal Swab; Nasopharyngeal(NP) swabs in vial transport medium  Result Value Ref Range Status   SARS Coronavirus 2 by RT PCR NEGATIVE NEGATIVE Final    Comment: (NOTE) SARS-CoV-2 target nucleic acids are NOT DETECTED.  The SARS-CoV-2 RNA is generally detectable in upper  respiratory specimens during the acute phase of infection. The lowest concentration of SARS-CoV-2 viral copies this assay can detect is 138 copies/mL. A negative result does not preclude SARS-Cov-2 infection and should not be used as the sole basis for treatment or other patient management decisions. A negative result may occur with  improper specimen collection/handling, submission of specimen other than nasopharyngeal swab, presence of viral mutation(s) within the areas targeted by this assay, and inadequate number of viral copies(<138 copies/mL). A negative result must be combined with clinical observations, patient history, and epidemiological information. The expected result is Negative.  Fact Sheet for Patients:  EntrepreneurPulse.com.au  Fact Sheet for Healthcare Providers:  IncredibleEmployment.be  This test is no t yet approved or cleared by the [place] FDA and  has been authorized for detection and/or diagnosis of SARS-CoV-2 by FDA under an Emergency Use Authorization (EUA). This EUA will remain  in effect (meaning this test can be used) for the duration of the COVID-19 declaration under Section 564(b)(1) of the Act, 21 U.S.C.section 360bbb-3(b)(1), unless the authorization is terminated  or revoked sooner.       Influenza A by PCR NEGATIVE NEGATIVE Final   Influenza B by PCR NEGATIVE NEGATIVE Final    Comment: (NOTE) The Xpert Xpress SARS-CoV-2/FLU/RSV plus assay is intended as an aid in the diagnosis of influenza from Nasopharyngeal swab specimens and should not be used as a sole basis for treatment. Nasal washings and aspirates are unacceptable for Xpert Xpress SARS-CoV-2/FLU/RSV testing.  Fact Sheet for Patients: EntrepreneurPulse.com.au  Fact Sheet for Healthcare Providers: IncredibleEmployment.be  This test is not yet approved or cleared by the [place] FDA and has been  authorized for detection and/or diagnosis of SARS-CoV-2 by FDA under an Emergency Use Authorization (EUA). This EUA will remain in effect (meaning this test can be used) for the duration of the COVID-19 declaration under Section 564(b)(1) of the Act, 21 U.S.C. section 360bbb-3(b)(1), unless the authorization is terminated or revoked.  Performed at [facility], [address] [place], [place]  [address]       Radiology Studies: CT HEAD WO CONTRAST  Result Date: 05/28/2020 CLINICAL DATA:  80 year old male with neurologic deficit. EXAM: CT HEAD WITHOUT CONTRAST TECHNIQUE: Contiguous axial images were obtained from the base of the skull through the vertex without intravenous contrast. COMPARISON:  None. FINDINGS: Brain: Mild age-related atrophy and moderate chronic microvascular ischemic changes. Left periventricular and white matter corona radiata hypodensity, chronic. There is no acute intracranial hemorrhage. No mass effect or midline shift. No extra-axial fluid collection. Vascular: No hyperdense vessel or unexpected calcification. Skull: Normal. Negative for fracture or focal lesion. Sinuses/Orbits: No acute finding. Other: None IMPRESSION: 1. No acute intracranial pathology. 2. Age-related atrophy and chronic microvascular ischemic changes. Electronically Signed   By: Anner Crete M.D.   On: 06/16/2020 20:02   DG Chest Portable 1 View  Result Date: 05/28/2020 CLINICAL DATA:  80 year old male with shortness of breath. EXAM: PORTABLE CHEST 1 VIEW COMPARISON:  Chest radiograph dated 06/03/2020. FINDINGS: No focal consolidation, pleural effusion, or pneumothorax. The cardiac silhouette is within limits. No acute osseous pathology. IMPRESSION: No active disease. Electronically Signed   By: Anner Crete M.D.   On: 06/01/2020 21:56       LOS: 0 days   [facility] Pager on [email or web address]  06/10/2020, 9:47 AM

## 2020-06-10 NOTE — TOC Initial Note (Signed)
Transition of Care [facility]) - Initial/Assessment Note    Patient Details  Name: Steve Maldonado MRN: [id number] Date of Birth: 10-Jan-1941  Transition of [facility]) CM/SW Contact:    Joaquin Courts, RN Phone Number: 06/10/2020, 3:15 PM  Clinical Narrative:                 CM spoke with patient at bedside re recommendation for SNF for short term rehab.  Patient is in agreement with this, states he is very weak.  CM discussed placement process with patient.  FL2 faxed out to area facilities.   Expected Discharge Plan: Skilled Nursing Facility Barriers to Discharge: Continued Medical Work up   Patient Goals and CMS Choice Patient states their goals for this hospitalization and ongoing recovery are:: to go to rehab CMS Medicare.gov Compare Post Acute Care list provided to:: Patient Choice offered to / list presented to : Patient  Expected Discharge Plan and Services Expected Discharge Plan: [place]   Discharge Planning Services: CM Consult Post Acute Care Choice: [place] Living arrangements for the past 2 months: Single Family Home                                      Prior Living Arrangements/Services Living arrangements for the past 2 months: Single Family Home Lives with:: Self Patient language and need for interpreter reviewed:: Yes Do you feel safe going back to the place where you live?: Yes      Need for Family Participation in Patient Care: No (Comment)     Criminal Activity/Legal Involvement Pertinent to Current Situation/Hospitalization: No - Comment as needed  Activities of Daily Living Home Assistive Devices/Equipment: CBG Meter ADL Screening (condition at time of admission) Patient's cognitive ability adequate to safely complete daily activities?: Yes Is the patient deaf or have difficulty hearing?: No Does the patient have difficulty seeing, even when wearing glasses/contacts?: No Does the patient have difficulty  concentrating, remembering, or making decisions?: No Patient able to express need for assistance with ADLs?: Yes Does the patient have difficulty dressing or bathing?: No Independently performs ADLs?: Yes (appropriate for developmental age) Does the patient have difficulty walking or climbing stairs?: Yes Weakness of Legs: Both Weakness of Arms/Hands: Both  Permission Sought/Granted                  Emotional Assessment Appearance:: Appears stated age     Orientation: : Oriented to Self,Oriented to Place,Oriented to  Time,Oriented to Situation   Psych Involvement: No (comment)  Admission diagnosis:  Anemia [D64.9] Thrombocytopenia ([place]) [D69.6] Anemia, unspecified type [D64.9] Symptomatic anemia [D64.9] Patient Active Problem List   Diagnosis Date Noted  . Symptomatic anemia 06/10/2020  . Anemia 05/31/2020  . Chronic kidney disease, stage 3a ([place]) 05/29/2020  . Prolonged QT interval 05/27/2020  . Hypernatremia 06/14/2020  . Physical debility 06/12/2020  . Coagulopathy ([place]) 05/30/2020  . Coronary artery disease of native artery of native heart with stable angina pectoris ([place]) 08/26/2018  . Monocytosis   . Orthostatic hypotension 02/18/2018  . Near syncope 02/17/2018  . Hypotension 02/17/2018  . Diabetes mellitus type 2 in nonobese ([place]) 02/17/2018  . Macrocytic anemia 02/17/2018  . Thrombocytopenia ([place]) 02/17/2018  . History of CVA in adulthood 02/17/2018  . Dyspnea on exertion 12/06/2016  . Upper airway cough syndrome 06/08/2013  . MIXED HYPERLIPIDEMIA 11/15/2008  . Essential hypertension 11/15/2008  .  CHEST PAIN 11/15/2008   PCP:  Burnard Bunting, MD Pharmacy:   [facility] [address] [place] - [place] AT [place] [address] [place] [address] Phone: [phone] Fax: [phone]     Social Determinants of Health (SDOH) Interventions    Readmission Risk Interventions No flowsheet  data found.

## 2020-06-10 NOTE — Progress Notes (Addendum)
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  He has been on observation.  Presented to the hospital with weakness and dyspnea.  CBC showed a WBC of 10.8, hemoglobin 7.4, MCV 102, platelets 31,000.  He has received 2 units PRBC so far this admission with improvement of his hemoglobin up to 8.6 this morning. Platelets are down to 25,000 this morning.  Pathologist review of peripheral blood smear shows circulating blasts.  He currently denies bleeding.  Weakness and dyspnea are minimally better.  He denies any recent fevers, chills, night sweats.  Appetite is fair but he states that the food is very good at the hospital.  He is not having any chest pain today.  REVIEW OF SYSTEMS:   Constitutional: Reports generalized weakness.  Denies fevers, chills.  Eyes: Denies blurriness of vision Ears, nose, mouth, throat, and face: Denies mucositis or sore throat Respiratory: Reports dyspnea with exertion Cardiovascular: Denies palpitation, chest discomfort Gastrointestinal:  Denies nausea, heartburn or change in bowel habits Skin: Denies abnormal skin rashes Lymphatics: Denies new lymphadenopathy or easy bruising Neurological:Denies numbness, tingling or new weaknesses Behavioral/Psych: Mood is stable, no new changes  Extremities: No lower extremity edema All other systems were reviewed with the patient and are negative.  I have reviewed the past medical history, past surgical history, social history and family history with the patient and they are unchanged from previous note.   PHYSICAL EXAMINATION: ECOG PERFORMANCE STATUS: 2 - Symptomatic, <50% confined to bed  Vitals:   06/10/20 0541 06/10/20 1214  BP: 115/67 116/68  Pulse: 80 87  Resp: 18 18  Temp: 98.9 F (37.2 C) 99 F (37.2 C)  SpO2: 92% 96%   Filed Weights   06/09/20 0133 06/10/20 0500  Weight: 77.3 kg 77.6 kg     Intake/Output from previous day: 03/20 0701 - 03/21 0700 In: 2042 [P.O.:1420; Blood:622] Out: 5520 [Urine:3650]  GENERAL: Awake and alert, appears fatigued SKIN: skin color, texture, turgor are normal, no rashes or significant lesions EYES: Conjunctival pallor LYMPH:  no palpable lymphadenopathy in the cervical, axillary or inguinal LUNGS: clear to auscultation and percussion with normal breathing effort HEART: regular rate & rhythm and no murmurs and no lower extremity edema ABDOMEN:abdomen soft, non-tender and normal bowel sounds, no HSM NEURO: alert & oriented x 3 with fluent speech, no focal motor/sensory deficits  LABORATORY DATA:  I have reviewed the data as listed CMP Latest Ref Rng & Units 06/10/2020 06/09/2020 06/06/2020  Glucose 70 - 99 mg/dL 174(H) 242(H) 210(H)  BUN 8 - 23 mg/dL 36(H) 34(H) 32(H)  Creatinine 0.61 - 1.24 mg/dL 1.80(H) 1.69(H) 1.55(H)  Sodium 135 - 145 mmol/L 139 145 149(H)  Potassium 3.5 - 5.1 mmol/L 4.4 3.8 3.4(L)  Chloride 98 - 111 mmol/L 109 110 112(H)  CO2 22 - 32 mmol/L _0 Calcium 8.9 - 10.3 mg/dL 8.0(L) 7.9(L) 8.0(L)  Total Protein 6.5 - 8.1 g/dL 5.4(L) 5.8(L) 6.0(L)  Total Bilirubin 0.3 - 1.2 mg/dL 1.3(H) 1.4(H) 1.5(H)  Alkaline Phos 38 - 126 U/L 96 88 78  AST 15 - 41 U/L _1 ALT 0 - 44 U/L _2 Lab Results  Component Value Date   WBC 13.4 (H) 06/10/2020   HGB 8.6 (L) 06/10/2020   HCT 28.2 (L) 06/10/2020   MCV 98.9 06/10/2020   PLT 25 (LL) 06/10/2020   NEUTROABS 5.8 06/07/2020  DG Chest 2 View  Result Date: 06/03/2020 CLINICAL DATA:  Shortness of breath. EXAM: CHEST - 2 VIEW COMPARISON:  02/17/2018 FINDINGS: Both lungs are clear. Heart and mediastinum are within normal limits. No large pleural effusions. Negative for pneumothorax. Trachea is midline. IMPRESSION: No active cardiopulmonary disease. Electronically Signed   By: Markus Daft M.D.   On: 06/03/2020 11:44   CT HEAD WO CONTRAST  Result Date:  06/11/2020 CLINICAL DATA:  80 year old male with neurologic deficit. EXAM: CT HEAD WITHOUT CONTRAST TECHNIQUE: Contiguous axial images were obtained from the base of the skull through the vertex without intravenous contrast. COMPARISON:  None. FINDINGS: Brain: Mild age-related atrophy and moderate chronic microvascular ischemic changes. Left periventricular and white matter corona radiata hypodensity, chronic. There is no acute intracranial hemorrhage. No mass effect or midline shift. No extra-axial fluid collection. Vascular: No hyperdense vessel or unexpected calcification. Skull: Normal. Negative for fracture or focal lesion. Sinuses/Orbits: No acute finding. Other: None IMPRESSION: 1. No acute intracranial pathology. 2. Age-related atrophy and chronic microvascular ischemic changes. Electronically Signed   By: Anner Crete M.D.   On: 06/04/2020 20:02   CT Abdomen Pelvis W Contrast  Result Date: 06/03/2020 CLINICAL DATA:  Abdominal pain, constipation. EXAM: CT ABDOMEN AND PELVIS WITH CONTRAST TECHNIQUE: Multidetector CT imaging of the abdomen and pelvis was performed using the standard protocol following bolus administration of intravenous contrast. CONTRAST:  174m OMNIPAQUE IOHEXOL 300 MG/ML  SOLN COMPARISON:  None. FINDINGS: Lower chest: Minimal bibasilar subsegmental atelectasis is noted. Hepatobiliary: No focal liver abnormality is seen. No gallstones, gallbladder wall thickening, or biliary dilatation. Pancreas: Unremarkable. No pancreatic ductal dilatation or surrounding inflammatory changes. Spleen: Calcified splenic granulomata are noted. Moderate splenomegaly is noted. Adrenals/Urinary Tract: Adrenal glands and kidneys are unremarkable. No hydronephrosis or renal obstruction is noted. No renal or ureteral calculi are noted. At least 2 small bladder diverticula are noted posteriorly and superiorly. Stomach/Bowel: Stomach appears normal. There is no evidence of bowel obstruction or inflammation.  Status post appendectomy. Vascular/Lymphatic: Aortic atherosclerosis. No enlarged abdominal or pelvic lymph nodes. Reproductive: Mild prostatic enlargement is noted. Other: No abdominal wall hernia or abnormality. No abdominopelvic ascites. Musculoskeletal: No acute or significant osseous findings. IMPRESSION: 1. Moderate splenomegaly. 2. At least 2 small bladder diverticula are noted posteriorly and superiorly. 3. Mild prostatic enlargement. 4. Aortic atherosclerosis. Aortic Atherosclerosis (ICD10-I70.0). Electronically Signed   By: JMarijo ConceptionM.D.   On: 06/03/2020 14:32   DG Chest Portable 1 View  Result Date: 06/07/2020 CLINICAL DATA:  80year old male with shortness of breath. EXAM: PORTABLE CHEST 1 VIEW COMPARISON:  Chest radiograph dated 06/03/2020. FINDINGS: No focal consolidation, pleural effusion, or pneumothorax. The cardiac silhouette is within limits. No acute osseous pathology. IMPRESSION: No active disease. Electronically Signed   By: AAnner CreteM.D.   On: 06/07/2020 21:56    ASSESSMENT AND PLAN: 80y.o. 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  Transfuse platelets  for platelet count less than 10,000 or active bleeding. -Noted to have elevated circulating blasts on peripheral blood smear concerning for possible  transformation to AML. -Recommend bone marrow biopsy for further evaluation.  Discussed the risk/benefits of a bone marrow biopsy with the patient and he agrees to proceed.  Orders have been placed for IR to perform a bone marrow biopsy hopefully tomorrow.   LOS: 0 days   Mikey Bussing, DNP, AGPCNP-BC, AOCNP 06/10/20   ADDENDUM  .Patient was Personally and independently interviewed, examined and relevant elements of the history of present illness were reviewed in details and an assessment and plan was created. All elements of the patient's history of present illness , assessment and plan were discussed in details with Mikey Bussing, DNP, AGPCNP-BC, AOCNP. The above documentation reflects our combined findings assessment and plan.  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 CT-guided bone marrow aspiration and biopsy hopefully on 3/22 Appreciate excellent hospital medicine cares by Dr. Maryland Pink. Transfuse PRBC as needed for hemoglobin less than 8 Transfuse PLT for platelet count less than 10k or if bleeding.  I had a detailed goals of care discussion with the patient and his close friend and confidant Dr. Cleon Gustin at bedside.  I discussed my concerns for transformation to acute myeloid leukemia.  Lives alone and has no immediate family.  Has a niece who is a close friend Dr. Milbert Coulter has been in touch with.  We discussed that if this is an acute myeloid leukemia he would not be a very good candidate for acute induction chemotherapy or transplant.  We would have to consider possible hypomethylating agent plus or minus venetoclax and significant burden of supportive transfusions OR best supportive cares through hospice. We will follow up on bone marrow biopsy results and continue to follow patient while in hospital.  Sullivan Lone MD MS

## 2020-06-10 NOTE — Evaluation (Addendum)
Physical Therapy Evaluation Patient Details Name: Steve Maldonado MRN: [id number] DOB: 1940-12-15 Today's Date: 06/10/2020   History of Present Illness  80 y.o. male with medical history significant for CAD, hypertension, history of CVA, type 2 diabetes mellitus, mild renal insufficiency, and MDS, presented to the emergency department 3/19/22for evaluation of weakness, inability to walk.    Noted to be profoundly anemic.  Received 2 units. Patient with dyspnea on exertion.   Was in ED  06/03/20 for SOB, constipation. weakness.  Clinical Impression  The pa patient is very weak, required 2 persons to take a few steps to recliner, noted knees potential to buckle. Decreased ability to weight shift and take a step. Decreased control of descent.  Patient lives alone, has not  Been able to ambulate for past several days.  Pt admitted with above diagnosis.   Pt currently with functional limitations due to the deficits listed below (see PT Problem List). Pt will benefit from skilled PT to increase their independence and safety with mobility to allow discharge to the venue listed below.    BP: supine 111/53, sitting 119/93, after transfer 110/83.   HR 80's, SPO2 2 L 94%    Follow Up Recommendations SNF- patient will require 24/7 assistance.    Equipment Recommendations  Rolling walker with 5" wheels    Recommendations for Other Services       Precautions / Restrictions Precautions Precautions: Fall      Mobility  Bed Mobility Overal bed mobility: Needs Assistance Bed Mobility: Supine to Sit     Supine to sit: Min assist     General bed mobility comments: assist with  trunk to sit upright    Transfers Overall transfer level: Needs assistance Equipment used: Rolling walker (2 wheeled) Transfers: Sit to/from [facility] Sit to Stand: +2 physical assistance;+2 safety/equipment;Mod assist Stand pivot transfers: +2 physical assistance;+2 safety/equipment;Max assist        General transfer comment: Mod assist to stand from raised bed. Attempted to take a  step, knees buckled , patient dropped to bed. Stood again and required 2 person max to support and Rw. Taking very small steps, decreased ability to take steps  to  back up. Cues to reach to recliner once close enough. Decreased control of descent to recliner.  Ambulation/Gait                Stairs            Wheelchair Mobility    Modified Rankin (Stroke Patients Only)       Balance Overall balance assessment: Needs assistance Sitting-balance support: Feet supported;Bilateral upper extremity supported Sitting balance-Leahy Scale: Fair     Standing balance support: During functional activity;Bilateral upper extremity supported Standing balance-Leahy Scale: Poor Standing balance comment: requires external support and RW                             Pertinent Vitals/Pain Pain Assessment: No/denies pain    Home Living Family/patient expects to be discharged to:: Private residence Living Arrangements: Alone   Type of Home: House Home Access: Stairs to enter Entrance Stairs-Rails: None Entrance Stairs-Number of Steps: 2 Home Layout: Two level;1/2 bath on main level;Bed/bath upstairs Home Equipment: None      Prior Function Level of Independence: Independent         Comments: recently has not been able to mobilize,     Hand Dominance   Dominant Hand: Right  Extremity/Trunk Assessment   Upper Extremity Assessment Upper Extremity Assessment: Generalized weakness    Lower Extremity Assessment Lower Extremity Assessment: Generalized weakness    Cervical / Trunk Assessment Cervical / Trunk Assessment: Normal  Communication   Communication: HOH  Cognition Arousal/Alertness: Awake/alert Behavior During Therapy: WFL for tasks assessed/performed Overall Cognitive Status: Within Functional Limits for tasks assessed                                         General Comments      Exercises     Assessment/Plan    PT Assessment Patient needs continued PT services  PT Problem List Decreased strength;Decreased mobility;Decreased knowledge of precautions;Decreased activity tolerance;Decreased balance;Decreased knowledge of use of DME       PT Treatment Interventions DME instruction;Therapeutic activities;Gait training;Therapeutic exercise;Patient/family education;Functional mobility training    PT Goals (Current goals can be found in the Care Plan section)  Acute Rehab PT Goals Patient Stated Goal: to get stronger PT Goal Formulation: With patient Time For Goal Achievement: 06/24/20 Potential to Achieve Goals: Fair    Frequency Min 2X/week   Barriers to discharge Decreased caregiver support;Inaccessible home environment      Co-evaluation               AM-PAC PT "6 Clicks" Mobility  Outcome Measure Help needed turning from your back to your side while in a flat bed without using bedrails?: A Little Help needed moving from lying on your back to sitting on the side of a flat bed without using bedrails?: A Little Help needed moving to and from a bed to a chair (including a wheelchair)?: A Lot Help needed standing up from a chair using your arms (e.g., wheelchair or bedside chair)?: A Lot Help needed to walk in hospital room?: Total Help needed climbing 3-5 steps with a railing? : Total 6 Click Score: 12    End of Session Equipment Utilized During Treatment: Gait belt;Oxygen Activity Tolerance: Patient limited by fatigue Patient left: in chair;with call bell/phone within reach;with chair alarm set Nurse Communication: Mobility status PT Visit Diagnosis: Unsteadiness on feet (R26.81);Difficulty in walking, not elsewhere classified (R26.2)    Time: 7001-7494 PT Time Calculation (min) (ACUTE ONLY): 26 min   Charges:   PT Evaluation $PT Eval Low Complexity: 1 Low PT Treatments $Therapeutic Activity: 8-22  mins        Tresa Endo PT Acute Rehabilitation Services Pager [phone] Office [phone]   Claretha Cooper 06/10/2020, 9:00 AM

## 2020-06-10 NOTE — Plan of Care (Signed)

## 2020-06-11 DIAGNOSIS — D61818 Other pancytopenia: Secondary | ICD-10-CM

## 2020-06-11 DIAGNOSIS — D649 Anemia, unspecified: Secondary | ICD-10-CM | POA: Diagnosis not present

## 2020-06-11 DIAGNOSIS — C9200 Acute myeloblastic leukemia, not having achieved remission: Secondary | ICD-10-CM | POA: Diagnosis not present

## 2020-06-11 DIAGNOSIS — N1831 Chronic kidney disease, stage 3a: Secondary | ICD-10-CM | POA: Diagnosis not present

## 2020-06-11 DIAGNOSIS — D696 Thrombocytopenia, unspecified: Secondary | ICD-10-CM | POA: Diagnosis not present

## 2020-06-11 LAB — CBC WITH DIFFERENTIAL/PLATELET
Abs Immature Granulocytes: 0.3 10*3/uL — ABNORMAL HIGH (ref 0.00–0.07)
Basophils Absolute: 0 10*3/uL (ref 0.0–0.1)
Basophils Relative: 0 %
Blasts: 30 %
Eosinophils Absolute: 0 10*3/uL (ref 0.0–0.5)
Eosinophils Relative: 0 %
HCT: 27 % — ABNORMAL LOW (ref 39.0–52.0)
Hemoglobin: 8.3 g/dL — ABNORMAL LOW (ref 13.0–17.0)
Lymphocytes Relative: 12 %
Lymphs Abs: 1.9 10*3/uL (ref 0.7–4.0)
MCH: 30.1 pg (ref 26.0–34.0)
MCHC: 30.7 g/dL (ref 30.0–36.0)
MCV: 97.8 fL (ref 80.0–100.0)
Monocytes Absolute: 0.8 10*3/uL (ref 0.1–1.0)
Monocytes Relative: 5 %
Myelocytes: 2 %
Neutro Abs: 8.1 10*3/uL — ABNORMAL HIGH (ref 1.7–7.7)
Neutrophils Relative %: 51 %
Platelets: 16 10*3/uL — CL (ref 150–400)
RBC: 2.76 MIL/uL — ABNORMAL LOW (ref 4.22–5.81)
RDW: 18.3 % — ABNORMAL HIGH (ref 11.5–15.5)
WBC: 15.8 10*3/uL — ABNORMAL HIGH (ref 4.0–10.5)
nRBC: 0.7 % — ABNORMAL HIGH (ref 0.0–0.2)

## 2020-06-11 LAB — GLUCOSE, CAPILLARY
Glucose-Capillary: 159 mg/dL — ABNORMAL HIGH (ref 70–99)
Glucose-Capillary: 260 mg/dL — ABNORMAL HIGH (ref 70–99)
Glucose-Capillary: 182 mg/dL — ABNORMAL HIGH (ref 70–99)
Glucose-Capillary: 200 mg/dL — ABNORMAL HIGH (ref 70–99)

## 2020-06-11 LAB — COMPREHENSIVE METABOLIC PANEL
ALT: 27 U/L (ref 0–44)
AST: 26 U/L (ref 15–41)
Albumin: 3.1 g/dL — ABNORMAL LOW (ref 3.5–5.0)
Alkaline Phosphatase: 126 U/L (ref 38–126)
Anion gap: 9 (ref 5–15)
BUN: 28 mg/dL — ABNORMAL HIGH (ref 8–23)
CO2: 20 mmol/L — ABNORMAL LOW (ref 22–32)
Calcium: 8.5 mg/dL — ABNORMAL LOW (ref 8.9–10.3)
Chloride: 111 mmol/L (ref 98–111)
Creatinine, Ser: 1.5 mg/dL — ABNORMAL HIGH (ref 0.61–1.24)
GFR, Estimated: 47 mL/min — ABNORMAL LOW (ref >60)
Glucose, Bld: 182 mg/dL — ABNORMAL HIGH (ref 70–99)
Potassium: 4.5 mmol/L (ref 3.5–5.1)
Sodium: 140 mmol/L (ref 135–145)
Total Bilirubin: 1.3 mg/dL — ABNORMAL HIGH (ref 0.3–1.2)
Total Protein: 5.8 g/dL — ABNORMAL LOW (ref 6.5–8.1)

## 2020-06-11 LAB — HEMOGLOBIN A1C
Hgb A1c MFr Bld: 5.9 % — ABNORMAL HIGH (ref 4.8–5.6)
Mean Plasma Glucose: 123 mg/dL

## 2020-06-11 MED ORDER — ALUM & MAG HYDROXIDE-SIMETH 200-200-20 MG/5ML PO SUSP
15.0000 mL | Freq: Four times a day (QID) | ORAL | Status: DC | PRN
  Administered 2020-06-11 – 2020-06-13 (×2): 15 mL via ORAL
  Filled 2020-06-11 (×2): qty 30

## 2020-06-11 MED ORDER — BISACODYL 10 MG RE SUPP
10.0000 mg | Freq: Every day | RECTAL | Status: DC | PRN
  Administered 2020-06-11: 10 mg via RECTAL
  Filled 2020-06-11: qty 1

## 2020-06-11 MED ORDER — ENSURE ENLIVE PO LIQD
237.0000 mL | ORAL | Status: DC
  Administered 2020-06-11 – 2020-06-13 (×3): 237 mL via ORAL

## 2020-06-11 MED ORDER — SODIUM CHLORIDE 0.45 % IV SOLN: INTRAVENOUS | Status: AC

## 2020-06-11 MED ORDER — FLEET ENEMA 7-19 GM/118ML RE ENEM: 1.0000 | ENEMA | Freq: Every day | RECTAL | Status: DC | PRN

## 2020-06-11 MED ORDER — PROSOURCE PLUS PO LIQD
30.0000 mL | Freq: Every day | ORAL | Status: DC
  Administered 2020-06-12 – 2020-06-14 (×3): 30 mL via ORAL
  Filled 2020-06-11 (×3): qty 30

## 2020-06-11 NOTE — Plan of Care (Signed)

## 2020-06-11 NOTE — Progress Notes (Signed)
Initial Nutrition Assessment  DOCUMENTATION CODES:   Not applicable  INTERVENTION:  - will order Ensure Enlive po once/day, each supplement provides 350 kcal and 20 grams of protein. - will order 30 ml Prosource Plus once/day, each supplement provides 100 kcal and 15 grams protein.    NUTRITION DIAGNOSIS:   Increased nutrient needs related to acute illness as evidenced by estimated needs.  GOAL:   Patient will meet greater than or equal to 90% of their needs  MONITOR:   PO intake,Supplement acceptance,Labs,Weight trends  REASON FOR ASSESSMENT:   Malnutrition Screening Tool  ASSESSMENT:   80 y.o. male with medical history of CAD, HTN, CVA, type 2 DM, mild renal insufficiency, and MDS. He presented to the ED due to weakness and fatigue x1 month. On the day of presentation, he was too weak to stand.  Recent meal intakes: 3/20- 100% of all meals (total of 1593 kcal and 98 grams protein) 3/21- 100% of breakfast and lunch (total of 1093 kcal and 61 grams protein) 3/22- 100% of breakfast (578 kcal and 35 grams protein)   Patient reports for the past ~1 month he has had a poor appetite. He lives alone and does the grocery shopping and meal preparation. Eating alone can be difficult and lead to a decreased interest.   Since admission, he has had a good appetite and has been eating at meals/larger portions than he was PTA.   Weight today is 174 lb and weight had been stable (181-186 lb) from 06/01/18-06/03/20. This indicates 10 lb weight loss (5% body weight) in the past week. Will continue to monitor closely.   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   Medications reviewed; sliding scale novolog, 10 units lantus/day, 3 mg melatonin/day, 5 mg oral vitamin K x1 dose/day (3/20-3/22), 17 g miralax BID, 2 tablets senokot/day.  IVF; 1/2 NS @ 50 ml/hr     NUTRITION - FOCUSED PHYSICAL EXAM:  completed; no muscle depletions, no fat depletions.   Diet Order:   Diet Order            Diet NPO time specified Except for: Sips with Meds  Diet effective midnight           Diet heart healthy/carb modified Room service appropriate? Yes; Fluid consistency: Thin  Diet effective now                 EDUCATION NEEDS:   No education needs have been identified at this time  Skin:  Skin Assessment: Reviewed RN Assessment  Last BM:  3/20  Height:   Ht Readings from Last 1 Encounters:  06/09/20 6' 3"  (1.905 m)    Weight:   Wt Readings from Last 1 Encounters:  06/11/20 78.9 kg     Estimated Nutritional Needs:  Kcal:  2100-2350 kcal Protein:  105-115 grams Fluid:  >/= 2.3 L/day      Jarome Matin, MS, RD, LDN, CNSC Inpatient Clinical Dietitian RD pager # available in [facility]  After hours/weekend pager # available in [facility]

## 2020-06-11 NOTE — TOC Progression Note (Signed)
Transition of Care [facility]) - Progression Note    Patient Details  Name: ARRIAN MANSON MRN: [id number] Date of Birth: [birth year]  [facility]) CM/SW Contact  Joaquin Courts, RN Phone Number: 06/11/2020, 11:42 AM  Clinical Narrative:    CM spoke with patient and provided bed offers with CMS ratings.  Patient reports he will review offers and make a decision.  TOC will follow up for final choice.   Expected Discharge Plan: [place] Barriers to Discharge: Continued Medical Work up  Expected Discharge Plan and Services Expected Discharge Plan: [place]   Discharge Planning Services: CM Consult Post Acute Care Choice: [place] Living arrangements for the past 2 months: Single Family Home                                       Social Determinants of Health (SDOH) Interventions    Readmission Risk Interventions No flowsheet data found.

## 2020-06-11 NOTE — Progress Notes (Addendum)
[facility] PROGRESS NOTE   Steve Maldonado RSW:[id number] DOB: Dec 23, 1940 DOA: 06/17/2020  PCP: Burnard Bunting, MD  Brief History/Interval Summary: 80 y.o. male with medical history significant for CAD, hypertension, history of CVA, type 2 diabetes mellitus, mild renal insufficiency, and MDS, presented to the emergency department for evaluation of weakness.    Noted to be profoundly anemic.  Patient with dyspnea on exertion.    Consultants: Hematology notified via epic  Procedures: None at this time  Antibiotics: Anti-infectives (From admission, onward)   None      Subjective/Interval History: Patient continues to feel fatigued.  Continues to mention difficulty breathing even with minimal exertion and with talking.  No nausea vomiting.  Reports good appetite.  No cough.      Assessment/Plan:  Acute myeloid leukemia/history of myelodysplastic syndrome Patient has been previously followed by Dr. Irene Limbo for his MDS.  His counts were stable as of October 2021.  He did not require any blood transfusions.  Came in with generalized weakness and fatigue and was noted to be significantly more anemic and thrombocytopenic than baseline.  He was transfused 2 units of blood for symptomatic anemia. Peripheral smear does suggest more than 20% blasts raising concern for transformation to acute myeloid leukemia.  Dr. Irene Limbo was subsequently consulted.  Plan is for a bone marrow biopsy.  Dr. Irene Limbo has discussed goals of care with patient.  Continue to monitor for now.  Leukocytosis is due to AML.  Symptomatic anemia with profound generalized weakness/fatigue Patient symptoms were initially thought to be due to his significant anemia.  He was transfused 2 units of PRBC.  Has not noticed much improvement in his symptoms.  See above discussion as well.  No overt bleeding noted.  Anemia panel did not show any clear-cut deficiencies.  Hemoglobin noted to be stable this morning. Continues to be quite  weak and fatigued.  Unsafe to go back home since he does live by himself.  Will likely need skilled nursing facility for rehab depending on how he does here in the hospital.  Thrombocytopenia Most likely a result of his AML.  Counts noted to be even lower today.  No bleeding noted.  Recommendation is for transfusion if it drops below 10,000 or if he has bleeding episodes.  Coagulopathy INR was noted to be elevated.  PTT is normal.  Could be due to nutritional deficiencies.  No history of liver disease. Vit K was ordered.  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  Came in with creatinine of 1.5-1.6.  Renal function worsened likely due to dehydration.  He was given IV fluids with improvement in creatinine today.  Monitor urine output.  Avoid nephrotoxic agents.    Hypernatremia Came in with sodium of 151.  Was hydrated.  Sodium level has improved.  Insulin-dependent diabetes mellitus type 2 HbA1c 6.3.  Patient remains on Lantus and SSI.  CBGs are reasonably well controlled.    History of coronary artery disease Stable.  Noted to be on statin.  Prolonged QT interval QTC 531 ms.  Monitor electrolytes.  Magnesium 2.6.  Potassium 3.8.   DVT Prophylaxis: SCDs only for now Code Status: Full code Family Communication: Discussed with patient.  He reports no family members.  He recommends that we  communicate with Dr. Shary Decamp (he is a retired Psychologist, sport and exercise). Disposition Plan: Patient will need to go to SNF.  Status is: Inpatient  Remains inpatient appropriate because:Ongoing diagnostic testing  needed not appropriate for outpatient work up, IV treatments appropriate due to intensity of illness or inability to take PO and Inpatient level of care appropriate due to severity of illness   Dispo:  Patient From: Home  Planned Disposition: [place]  Medically stable for discharge: No         Medications:  Scheduled: . sodium  chloride   Intravenous Once  . insulin aspart  0-5 Units Subcutaneous QHS  . insulin aspart  0-9 Units Subcutaneous TID WC  . insulin glargine  10 Units Subcutaneous Daily  . melatonin  3 mg Oral QHS  . polyethylene glycol  17 g Oral BID  . rosuvastatin  20 mg Oral QPM  . senna-docusate  2 tablet Oral QHS   Continuous: . sodium chloride 10 mL/hr at 06/04/2020 2345   JGG:EZMOQH chloride, acetaminophen **OR** acetaminophen   Objective:  Vital Signs  Vitals:   06/10/20 1214 06/10/20 2020 06/11/20 0540 06/11/20 0546  BP: 116/68 (!) 118/59 119/62   Pulse: 87 99 93   Resp: _0 Temp: 99 F (37.2 C) 98 F (36.7 C) 98.5 F (36.9 C)   TempSrc:  Oral Oral   SpO2: 96% 93% 91% 94%  Weight:    78.9 kg  Height:        Intake/Output Summary (Last 24 hours) at 06/11/2020 1107 Last data filed at 06/11/2020 0900 Gross per 24 hour  Intake 2431.85 ml  Output 2750 ml  Net -318.15 ml   Filed Weights   06/09/20 0133 06/10/20 0500 06/11/20 0546  Weight: 77.3 kg 77.6 kg 78.9 kg    General appearance: Awake alert.  In no distress Resp: Clear to auscultation bilaterally.  Normal effort Cardio: S1-S2 is normal regular.  No S3-S4.  No rubs murmurs or bruit GI: Abdomen is soft.  Nontender nondistended.  Bowel sounds are present normal.  No masses organomegaly Extremities: No edema.  Physical deconditioning is noted. Neurologic: Alert and oriented x3.  No focal neurological deficits.      Lab Results:  Data Reviewed: I have personally reviewed following labs and imaging studies  CBC: Recent Labs  Lab 05/25/2020 1824 06/09/20 0539 06/10/20 0848 06/11/20 0807  WBC 10.8* 13.0* 13.4* 15.8*  NEUTROABS 5.8  --   --  8.1*  HGB 7.4* 7.3* 8.6* 8.3*  HCT 26.1* 25.6* 28.2* 27.0*  MCV 102.0* 102.4* 98.9 97.8  PLT 31* 35* 25* 16*    Basic Metabolic Panel: Recent Labs  Lab 06/07/2020 1824 06/09/20 0539 06/10/20 0457 06/11/20 0550  NA 149* 145 139 140  K 3.4* 3.8 4.4 4.5  CL 112*  110 109 111  CO2 _1 20*  GLUCOSE 210* 242* 174* 182*  BUN 32* 34* 36* 28*  CREATININE 1.55* 1.69* 1.80* 1.50*  CALCIUM 8.0* 7.9* 8.0* 8.5*  MG  --  2.6*  --   --     GFR: Estimated Creatinine Clearance: 43.8 mL/min (A) (by C-G formula based on SCr of 1.5 mg/dL (H)).  Liver Function Tests: Recent Labs  Lab 06/20/2020 1824 06/09/20 0539 06/10/20 0457 06/11/20 0550  AST _2 ALT _3 ALKPHOS 78 88 96 126  BILITOT 1.5* 1.4* 1.3* 1.3*  PROT 6.0* 5.8* 5.4* 5.8*  ALBUMIN 3.4* 3.3* 3.0* 3.1*     Coagulation Profile: Recent Labs  Lab 05/31/2020 1824  INR 1.5*    Cardiac Enzymes: Recent Labs  Lab 06/09/20 0539  CKTOTAL 52  CBG: Recent Labs  Lab 06/10/20 0725 06/10/20 1140 06/10/20 1622 06/10/20 2157 06/11/20 0731  GLUCAP 152* 287* 228* 197* 159*    Thyroid Function Tests: Recent Labs    05/26/2020 1826  TSH 2.157    Anemia Panel: Recent Labs    06/07/2020 2206  VITAMINB12 1,840*  FOLATE 6.3  FERRITIN 309  TIBC 207*  IRON 55  RETICCTPCT 0.6    Recent Results (from the past 240 hour(s))  Resp Panel by RT-PCR (Flu A&B, Covid) Nasopharyngeal Swab     Status: None   Collection Time: 06/10/2020 10:27 PM   Specimen: Nasopharyngeal Swab; Nasopharyngeal(NP) swabs in vial transport medium  Result Value Ref Range Status   SARS Coronavirus 2 by RT PCR NEGATIVE NEGATIVE Final    Comment: (NOTE) SARS-CoV-2 target nucleic acids are NOT DETECTED.  The SARS-CoV-2 RNA is generally detectable in upper respiratory specimens during the acute phase of infection. The lowest concentration of SARS-CoV-2 viral copies this assay can detect is 138 copies/mL. A negative result does not preclude SARS-Cov-2 infection and should not be used as the sole basis for treatment or other patient management decisions. A negative result may occur with  improper specimen collection/handling, submission of specimen other than nasopharyngeal swab, presence of viral  mutation(s) within the areas targeted by this assay, and inadequate number of viral copies(<138 copies/mL). A negative result must be combined with clinical observations, patient history, and epidemiological information. The expected result is Negative.  Fact Sheet for Patients:  EntrepreneurPulse.com.au  Fact Sheet for Healthcare Providers:  IncredibleEmployment.be  This test is no t yet approved or cleared by the [place] FDA and  has been authorized for detection and/or diagnosis of SARS-CoV-2 by FDA under an Emergency Use Authorization (EUA). This EUA will remain  in effect (meaning this test can be used) for the duration of the COVID-19 declaration under Section 564(b)(1) of the Act, 21 U.S.C.section 360bbb-3(b)(1), unless the authorization is terminated  or revoked sooner.       Influenza A by PCR NEGATIVE NEGATIVE Final   Influenza B by PCR NEGATIVE NEGATIVE Final    Comment: (NOTE) The Xpert Xpress SARS-CoV-2/FLU/RSV plus assay is intended as an aid in the diagnosis of influenza from Nasopharyngeal swab specimens and should not be used as a sole basis for treatment. Nasal washings and aspirates are unacceptable for Xpert Xpress SARS-CoV-2/FLU/RSV testing.  Fact Sheet for Patients: EntrepreneurPulse.com.au  Fact Sheet for Healthcare Providers: IncredibleEmployment.be  This test is not yet approved or cleared by the [place] FDA and has been authorized for detection and/or diagnosis of SARS-CoV-2 by FDA under an Emergency Use Authorization (EUA). This EUA will remain in effect (meaning this test can be used) for the duration of the COVID-19 declaration under Section 564(b)(1) of the Act, 21 U.S.C. section 360bbb-3(b)(1), unless the authorization is terminated or revoked.  Performed at [facility], [place] [address] [place], [place] [address]       Radiology  Studies: No results found.     LOS: 1 day   Gudelia Eugene [employer] on [email or web address]  06/11/2020, 11:07 AM

## 2020-06-11 NOTE — Progress Notes (Addendum)
Referring Physician(s): Steve Maldonado  Supervising Physician: Steve Maldonado  Patient Status:  [facility] - In-pt  Chief Complaint: Dyspnea, weakness   Subjective: Patient familiar to IR service from bone marrow biopsy in 2019.  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  He has been on observation.  He recently presented to [facility] with persistent weakness and dyspnea.  Chest x-ray was negative.  Covid 19 neg.   Admission CBC showed a WBC of 10.8, hemoglobin 7.4, platelets of 31k.  He received 2 units of packed red blood cells. Current labs include WBC 15.8, hemoglobin 8.3, platelets 16k.  Peripheral blood smear shows circulating blasts.  Due to above findings and concern for possible transformation to AML request now received for CT-guided bone marrow biopsy for further evaluation.  He currently denies fever, headache, chest pain, cough, abdominal/back pain, nausea, vomiting or bleeding.   Past Medical History:  Diagnosis Date  . Diabetes mellitus without complication ([place])   . Hyperlipidemia   . Hypertension   . Stroke [facility]) 1970   Past Surgical History:  Procedure Laterality Date  . APPENDECTOMY  1980  . INTRAVASCULAR ULTRASOUND/IVUS N/A 12/10/2016   Procedure: Intravascular Ultrasound/IVUS;  Surgeon: Steve Mormon, MD;  Location: [place] [facility];  Service: Cardiovascular;  Laterality: N/A;  . LEFT HEART CATH AND CORONARY ANGIOGRAPHY N/A 12/10/2016   Procedure: LEFT HEART CATH AND CORONARY ANGIOGRAPHY;  Surgeon: Steve Mormon, MD;  Location: [place] [facility];  Service: Cardiovascular;  Laterality: N/A;     Allergies: Patient has no known allergies.  Medications: Prior to Admission medications   Medication Sig Start Date End Date Taking? Authorizing Provider  alfuzosin (UROXATRAL) 10 MG 24 hr tablet Take 10 mg by mouth every evening. 05/09/20  Yes  [provider]  empagliflozin (JARDIANCE) 25 MG TABS tablet Take 25 mg by mouth every evening.   Yes [provider]  insulin degludec (TRESIBA FLEXTOUCH) 100 UNIT/ML FlexTouch Pen Inject 20 Units into the skin every morning.   Yes [provider]  metoprolol succinate (TOPROL-XL) 50 MG 24 hr tablet TAKE 1 TABLET BY MOUTH DAILY Patient taking differently: Take 50 mg by mouth every evening. 11/16/19  Yes Patwardhan, Manish J, MD  nitroGLYCERIN (NITROSTAT) 0.4 MG SL tablet Place 0.4 mg under the tongue every 5 (five) minutes as needed for chest pain. 01/04/20  Yes [provider]  rosuvastatin (CRESTOR) 20 MG tablet TAKE 1 TABLET BY MOUTH DAILY Patient taking differently: Take 20 mg by mouth every evening. 02/20/20  Yes Patwardhan, Manish J, MD  SitaGLIPtin-MetFORMIN HCl [phone] MG TB24 Take 1 tablet by mouth every evening.   Yes [provider]     Vital Signs: BP 119/62 (BP Location: Left Arm)   Pulse 93   Temp 98.5 F (36.9 C) (Oral)   Resp 20   Ht 6' 3"  (1.905 m)   Wt 173 lb 15.1 oz (78.9 kg)   SpO2 94%   BMI 21.74 kg/m   Physical Exam awake, alert.  Chest clear to auscultation bilaterally.  Heart with sl tachy rate , occ ectopy.  Abdomen soft, positive bowel sounds, nontender.  No lower extremity edema.  Imaging: CT HEAD WO CONTRAST  Result Date: 06/01/2020 CLINICAL DATA:  80 year old male with neurologic deficit. EXAM: CT HEAD WITHOUT CONTRAST TECHNIQUE: Contiguous axial images were obtained from the base of the skull through the vertex without  intravenous contrast. COMPARISON:  None. FINDINGS: Brain: Mild age-related atrophy and moderate chronic microvascular ischemic changes. Left periventricular and white matter corona radiata hypodensity, chronic. There is no acute intracranial hemorrhage. No mass effect or midline shift. No extra-axial fluid collection. Vascular: No hyperdense vessel or unexpected calcification. Skull: Normal.  Negative for fracture or focal lesion. Sinuses/Orbits: No acute finding. Other: None IMPRESSION: 1. No acute intracranial pathology. 2. Age-related atrophy and chronic microvascular ischemic changes. Electronically Signed   By: Steve Maldonado M.D.   On: 06/06/2020 20:02   DG Chest Portable 1 View  Result Date: 05/24/2020 CLINICAL DATA:  80 year old male with shortness of breath. EXAM: PORTABLE CHEST 1 VIEW COMPARISON:  Chest radiograph dated 06/03/2020. FINDINGS: No focal consolidation, pleural effusion, or pneumothorax. The cardiac silhouette is within limits. No acute osseous pathology. IMPRESSION: No active disease. Electronically Signed   By: Steve Maldonado M.D.   On: 06/16/2020 21:56    Labs:  CBC: Recent Labs    05/31/2020 1824 06/09/20 0539 06/10/20 0848 06/11/20 0807  WBC 10.8* 13.0* 13.4* 15.8*  HGB 7.4* 7.3* 8.6* 8.3*  HCT 26.1* 25.6* 28.2* 27.0*  PLT 31* 35* 25* 16*    COAGS: Recent Labs    06/11/2020 1824 06/09/20 0539  INR 1.5*  --   APTT 33 36    BMP: Recent Labs    09/04/19 1050 01/02/20 1225 06/16/2020 1824 06/09/20 0539 06/10/20 0457 06/11/20 0550  NA 138   < > 149* 145 139 140  K 4.7   < > 3.4* 3.8 4.4 4.5  CL 103   < > 112* 110 109 111  CO2 23   < > 24 24 23  20*  GLUCOSE 381*   < > 210* 242* 174* 182*  BUN 28*   < > 32* 34* 36* 28*  CALCIUM 9.8   < > 8.0* 7.9* 8.0* 8.5*  CREATININE 1.39*   < > 1.55* 1.69* 1.80* 1.50*  GFRNONAA 48*   < > 45* 41* 38* 47*  GFRAA 55*  --   --   --   --   --    < > = values in this interval not displayed.    LIVER FUNCTION TESTS: Recent Labs    06/18/2020 1824 06/09/20 0539 06/10/20 0457 06/11/20 0550  BILITOT 1.5* 1.4* 1.3* 1.3*  AST 23 20 23 26   ALT 20 20 21 27   ALKPHOS 78 88 96 126  PROT 6.0* 5.8* 5.4* 5.8*  ALBUMIN 3.4* 3.3* 3.0* 3.1*    Assessment and Plan: Patient familiar to IR service from bone marrow biopsy in 2019.  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  He has been on observation.  He recently presented to [facility] with persistent weakness and dyspnea.  Chest x-ray was negative.  Covid 19 neg.   Admission CBC showed a WBC of 10.8, hemoglobin 7.4, platelets of 31k.  He received 2 units of packed red blood cells. Current labs include WBC 15.8, hemoglobin 8.3, platelets 16k.  Peripheral blood smear shows circulating blasts.  Due to above findings and concern for possible transformation to AML request now received for CT-guided bone marrow biopsy for further evaluation. Risks and benefits of procedure was discussed with the patient  including, but not limited to bleeding, infection, damage to adjacent structures or low yield requiring additional tests.  All of the questions were answered and there is agreement  to proceed.  Consent signed and in chart.  Procedure scheduled for 3/23 am   Electronically Signed: D. Rowe Robert, PA-C 06/11/2020, 11:07 AM   I spent a total of 20 minutes at the the patient's bedside AND on the patient's hospital floor or unit, greater than 50% of which was counseling/coordinating care for CT-guided bone marrow biopsy    Patient ID: Steve Maldonado, male   DOB: [birth year], 80 y.o.   MRN: [id number]

## 2020-06-11 NOTE — TOC Progression Note (Signed)
Transition of Care [facility]) - Progression Note    Patient Details  Name: Steve Maldonado MRN: [id number] Date of Birth: 1940-04-22  Transition of Care [facility]) CM/SW Contact  Joaquin Courts, RN Phone Number: 06/11/2020, 2:40 PM  Clinical Narrative:    CM followed up with patient who chose [facility] for rehab.  Facility rep notified, states CM will need to follow up closer to dc date to see if facility still has bed availability at that time. TOC will continue to follow.   Expected Discharge Plan: [place] Barriers to Discharge: Continued Medical Work up  Expected Discharge Plan and Services Expected Discharge Plan: [place]   Discharge Planning Services: CM Consult Post Acute Care Choice: [place] Living arrangements for the past 2 months: Single Family Home                                       Social Determinants of Health (SDOH) Interventions    Readmission Risk Interventions No flowsheet data found.

## 2020-06-12 ENCOUNTER — Encounter (HOSPITAL_COMMUNITY): Payer: Self-pay | Admitting: Internal Medicine

## 2020-06-12 ENCOUNTER — Inpatient Hospital Stay (HOSPITAL_COMMUNITY): Payer: Medicare PPO

## 2020-06-12 DIAGNOSIS — D689 Coagulation defect, unspecified: Secondary | ICD-10-CM

## 2020-06-12 DIAGNOSIS — E119 Type 2 diabetes mellitus without complications: Secondary | ICD-10-CM

## 2020-06-12 DIAGNOSIS — C9200 Acute myeloblastic leukemia, not having achieved remission: Secondary | ICD-10-CM | POA: Diagnosis not present

## 2020-06-12 DIAGNOSIS — N1831 Chronic kidney disease, stage 3a: Secondary | ICD-10-CM | POA: Diagnosis not present

## 2020-06-12 DIAGNOSIS — E870 Hyperosmolality and hypernatremia: Secondary | ICD-10-CM

## 2020-06-12 DIAGNOSIS — Z8673 Personal history of transient ischemic attack (TIA), and cerebral infarction without residual deficits: Secondary | ICD-10-CM

## 2020-06-12 DIAGNOSIS — R5381 Other malaise: Secondary | ICD-10-CM

## 2020-06-12 DIAGNOSIS — J189 Pneumonia, unspecified organism: Secondary | ICD-10-CM

## 2020-06-12 DIAGNOSIS — D696 Thrombocytopenia, unspecified: Secondary | ICD-10-CM | POA: Diagnosis not present

## 2020-06-12 DIAGNOSIS — I25118 Atherosclerotic heart disease of native coronary artery with other forms of angina pectoris: Secondary | ICD-10-CM

## 2020-06-12 DIAGNOSIS — R9431 Abnormal electrocardiogram [ECG] [EKG]: Secondary | ICD-10-CM

## 2020-06-12 DIAGNOSIS — D649 Anemia, unspecified: Secondary | ICD-10-CM | POA: Diagnosis not present

## 2020-06-12 LAB — COMPREHENSIVE METABOLIC PANEL
ALT: 26 U/L (ref 0–44)
AST: 21 U/L (ref 15–41)
Albumin: 2.9 g/dL — ABNORMAL LOW (ref 3.5–5.0)
Alkaline Phosphatase: 151 U/L — ABNORMAL HIGH (ref 38–126)
Anion gap: 9 (ref 5–15)
BUN: 28 mg/dL — ABNORMAL HIGH (ref 8–23)
CO2: 19 mmol/L — ABNORMAL LOW (ref 22–32)
Calcium: 8.4 mg/dL — ABNORMAL LOW (ref 8.9–10.3)
Chloride: 110 mmol/L (ref 98–111)
Creatinine, Ser: 1.3 mg/dL — ABNORMAL HIGH (ref 0.61–1.24)
GFR, Estimated: 56 mL/min — ABNORMAL LOW (ref >60)
Glucose, Bld: 174 mg/dL — ABNORMAL HIGH (ref 70–99)
Potassium: 4.1 mmol/L (ref 3.5–5.1)
Sodium: 138 mmol/L (ref 135–145)
Total Bilirubin: 1.3 mg/dL — ABNORMAL HIGH (ref 0.3–1.2)
Total Protein: 5.6 g/dL — ABNORMAL LOW (ref 6.5–8.1)

## 2020-06-12 LAB — CBC WITH DIFFERENTIAL/PLATELET
Abs Immature Granulocytes: 0.6 10*3/uL — ABNORMAL HIGH (ref 0.00–0.07)
Band Neutrophils: 1 %
Basophils Absolute: 0 10*3/uL (ref 0.0–0.1)
Basophils Relative: 0 %
Blasts: 30 %
Eosinophils Absolute: 0 10*3/uL (ref 0.0–0.5)
Eosinophils Relative: 0 %
HCT: 26.2 % — ABNORMAL LOW (ref 39.0–52.0)
Hemoglobin: 8 g/dL — ABNORMAL LOW (ref 13.0–17.0)
Lymphocytes Relative: 7 %
Lymphs Abs: 1.4 10*3/uL (ref 0.7–4.0)
MCH: 29.9 pg (ref 26.0–34.0)
MCHC: 30.5 g/dL (ref 30.0–36.0)
MCV: 97.8 fL (ref 80.0–100.0)
Monocytes Absolute: 4.1 10*3/uL — ABNORMAL HIGH (ref 0.1–1.0)
Monocytes Relative: 20 %
Myelocytes: 3 %
Neutro Abs: 8.1 10*3/uL — ABNORMAL HIGH (ref 1.7–7.7)
Neutrophils Relative %: 39 %
Platelets: 16 10*3/uL — CL (ref 150–400)
RBC: 2.68 MIL/uL — ABNORMAL LOW (ref 4.22–5.81)
RDW: 18.1 % — ABNORMAL HIGH (ref 11.5–15.5)
WBC: 20.3 10*3/uL — ABNORMAL HIGH (ref 4.0–10.5)
nRBC: 1.2 % — ABNORMAL HIGH (ref 0.0–0.2)

## 2020-06-12 LAB — GLUCOSE, CAPILLARY
Glucose-Capillary: 174 mg/dL — ABNORMAL HIGH (ref 70–99)
Glucose-Capillary: 235 mg/dL — ABNORMAL HIGH (ref 70–99)
Glucose-Capillary: 221 mg/dL — ABNORMAL HIGH (ref 70–99)
Glucose-Capillary: 185 mg/dL — ABNORMAL HIGH (ref 70–99)

## 2020-06-12 MED ORDER — FENTANYL CITRATE (PF) 100 MCG/2ML IJ SOLN
INTRAMUSCULAR | Status: AC | PRN
  Administered 2020-06-12: 50 ug via INTRAVENOUS

## 2020-06-12 MED ORDER — ALBUTEROL SULFATE (2.5 MG/3ML) 0.083% IN NEBU: 2.5000 mg | INHALATION_SOLUTION | Freq: Four times a day (QID) | RESPIRATORY_TRACT | Status: DC | PRN

## 2020-06-12 MED ORDER — FENTANYL CITRATE (PF) 100 MCG/2ML IJ SOLN
INTRAMUSCULAR | Status: AC
  Filled 2020-06-12: qty 2

## 2020-06-12 MED ORDER — MIDAZOLAM HCL 2 MG/2ML IJ SOLN
INTRAMUSCULAR | Status: AC | PRN
  Administered 2020-06-12 (×2): 1 mg via INTRAVENOUS

## 2020-06-12 MED ORDER — LEVALBUTEROL HCL 0.63 MG/3ML IN NEBU
0.6300 mg | INHALATION_SOLUTION | Freq: Four times a day (QID) | RESPIRATORY_TRACT | Status: DC
  Administered 2020-06-13 – 2020-06-15 (×7): 0.63 mg via RESPIRATORY_TRACT
  Filled 2020-06-12 (×7): qty 3

## 2020-06-12 MED ORDER — LEVALBUTEROL HCL 0.63 MG/3ML IN NEBU
0.6300 mg | INHALATION_SOLUTION | Freq: Four times a day (QID) | RESPIRATORY_TRACT | Status: DC
  Administered 2020-06-12 (×2): 0.63 mg via RESPIRATORY_TRACT
  Filled 2020-06-12 (×2): qty 3

## 2020-06-12 MED ORDER — SODIUM CHLORIDE 0.9 % IV SOLN
100.0000 mg | Freq: Two times a day (BID) | INTRAVENOUS | Status: DC
  Administered 2020-06-12 – 2020-06-14 (×4): 100 mg via INTRAVENOUS
  Filled 2020-06-12 (×5): qty 100

## 2020-06-12 MED ORDER — IPRATROPIUM BROMIDE 0.02 % IN SOLN
0.5000 mg | Freq: Four times a day (QID) | RESPIRATORY_TRACT | Status: DC
  Administered 2020-06-12 (×2): 0.5 mg via RESPIRATORY_TRACT
  Filled 2020-06-12 (×2): qty 2.5

## 2020-06-12 MED ORDER — SENNOSIDES-DOCUSATE SODIUM 8.6-50 MG PO TABS
2.0000 | ORAL_TABLET | Freq: Two times a day (BID) | ORAL | Status: DC
  Administered 2020-06-12 – 2020-06-14 (×3): 2 via ORAL
  Filled 2020-06-12 (×4): qty 2

## 2020-06-12 MED ORDER — IPRATROPIUM BROMIDE 0.02 % IN SOLN
0.5000 mg | Freq: Four times a day (QID) | RESPIRATORY_TRACT | Status: DC
  Administered 2020-06-13 – 2020-06-15 (×7): 0.5 mg via RESPIRATORY_TRACT
  Filled 2020-06-12 (×7): qty 2.5

## 2020-06-12 MED ORDER — MIDAZOLAM HCL 2 MG/2ML IJ SOLN
INTRAMUSCULAR | Status: AC
  Filled 2020-06-12: qty 2

## 2020-06-12 MED ORDER — SODIUM CHLORIDE 0.9 % IV SOLN
1.0000 g | INTRAVENOUS | Status: DC
  Administered 2020-06-12 – 2020-06-13 (×2): 1 g via INTRAVENOUS
  Filled 2020-06-12: qty 10
  Filled 2020-06-12 (×2): qty 1

## 2020-06-12 MED ORDER — MAGNESIUM CITRATE PO SOLN
1.0000 | Freq: Once | ORAL | Status: AC
  Administered 2020-06-12: 1 via ORAL
  Filled 2020-06-12: qty 296

## 2020-06-12 MED ORDER — GUAIFENESIN ER 600 MG PO TB12
1200.0000 mg | ORAL_TABLET | Freq: Two times a day (BID) | ORAL | Status: DC
  Administered 2020-06-12 – 2020-06-14 (×5): 1200 mg via ORAL
  Filled 2020-06-12 (×5): qty 2

## 2020-06-12 NOTE — TOC Progression Note (Signed)
Transition of Care [facility]) - Progression Note    Patient Details  Name: Steve Maldonado MRN: [id number] Date of Birth: [birth year]  Transition of Care [facility]) CM/SW Contact  Sylvestre Rathgeber, Juliann Pulse, RN Phone Number: 06/12/2020, 2:27 PM  Clinical Narrative: Damaris Schooner to Ronalee Belts Leone(neighbor) contact person-about d/c plans-he expressed concerns about the medicare SNF's choices,&  Ratings-does not want [facility]; prefers [facility] if SNF-spoke to [facility] need to be on 02 max 2l-she would follow,Mike also expressed that depending on Palliative care concerns may need hospice-ongoing discussions to be explored.CM will follow for options to offer.      Expected Discharge Plan: [place] Barriers to Discharge: Continued Medical Work up  Expected Discharge Plan and Services Expected Discharge Plan: [place]   Discharge Planning Services: CM Consult Post Acute Care Choice: [place] Living arrangements for the past 2 months: Single Family Home                                       Social Determinants of Health (SDOH) Interventions    Readmission Risk Interventions No flowsheet data found.

## 2020-06-12 NOTE — Procedures (Signed)
Interventional Radiology Procedure Note  Procedure: CT BM ASP AND CORE    Complications: None  Estimated Blood Loss:  MIN  Findings: 11 G CORE AND ASP    M. TREVOR Yonah Tangeman, MD    

## 2020-06-12 NOTE — Progress Notes (Signed)
HEMATOLOGY-ONCOLOGY PROGRESS NOTE  SUBJECTIVE:   Mr. Berns was seen in follow-up for his pancytopenia.  He had his bone marrow biopsy uneventfully this morning.  Still notes problems with constipation and has not had a bowel movement and would like me to increase his laxatives. Notes that he still feels somewhat short of breath.  Hemoglobin is 8, platelets 16k, WBC count increased to 20.3k with increasing monocytes and myeloid left shift. Further discussed goals of care with the patient.  He would like to have the results of the bone marrow biopsy prior to making a final decision which is appropriate.   REVIEW OF SYSTEMS:  .10 Point review of Systems was done is negative except as noted above.  I have reviewed the past medical history, past surgical history, social history and family history with the patient and they are unchanged from previous note.   PHYSICAL EXAMINATION: ECOG PERFORMANCE STATUS: 2 - Symptomatic, <50% confined to bed  Vitals:   06/12/20 1227 06/12/20 1332  BP: 123/62 114/67  Pulse: (!) 107 (!) 109  Resp: (!) 30 (!) 30  Temp: 98.5 F (36.9 C) 98.6 F (37 C)  SpO2: 93% 94%   Filed Weights   06/09/20 0133 06/10/20 0500 06/11/20 0546  Weight: 170 lb 6.7 oz (77.3 kg) 171 lb 1.2 oz (77.6 kg) 173 lb 15.1 oz (78.9 kg)    Intake/Output from previous day: 03/22 0701 - 03/23 0700 In: 120 [P.O.:120] Out: 1975 [Urine:1975]  GENERAL: Awake and alert, appears fatigued SKIN: skin color, texture, turgor are normal, no rashes or significant lesions EYES: Conjunctival pallor LYMPH:  no palpable lymphadenopathy in the cervical, axillary or inguinal LUNGS: clear to auscultation and percussion with normal breathing effort HEART: regular rate & rhythm and no murmurs and no lower extremity edema ABDOMEN:abdomen soft, non-tender and normal bowel sounds, no HSM NEURO: alert & oriented x 3 with fluent speech, no focal motor/sensory deficits  LABORATORY DATA:  I have reviewed the  data as listed CMP Latest Ref Rng & Units 06/12/2020 06/11/2020 06/10/2020  Glucose 70 - 99 mg/dL 174(H) 182(H) 174(H)  BUN 8 - 23 mg/dL 28(H) 28(H) 36(H)  Creatinine 0.61 - 1.24 mg/dL 1.30(H) 1.50(H) 1.80(H)  Sodium 135 - 145 mmol/L 138 140 139  Potassium 3.5 - 5.1 mmol/L 4.1 4.5 4.4  Chloride 98 - 111 mmol/L 110 111 109  CO2 22 - 32 mmol/L 19(L) 20(L) 23  Calcium 8.9 - 10.3 mg/dL 8.4(L) 8.5(L) 8.0(L)  Total Protein 6.5 - 8.1 g/dL 5.6(L) 5.8(L) 5.4(L)  Total Bilirubin 0.3 - 1.2 mg/dL 1.3(H) 1.3(H) 1.3(H)  Alkaline Phos 38 - 126 U/L 151(H) 126 96  AST 15 - 41 U/L 21 26 23  ALT 0 - 44 U/L 26 27 21    Lab Results  Component Value Date   WBC 20.3 (H) 06/12/2020   HGB 8.0 (L) 06/12/2020   HCT 26.2 (L) 06/12/2020   MCV 97.8 06/12/2020   PLT 16 (LL) 06/12/2020   NEUTROABS 8.1 (H) 06/12/2020    DG Chest 2 View  Result Date: 06/03/2020 CLINICAL DATA:  Shortness of breath. EXAM: CHEST - 2 VIEW COMPARISON:  02/17/2018 FINDINGS: Both lungs are clear. Heart and mediastinum are within normal limits. No large pleural effusions. Negative for pneumothorax. Trachea is midline. IMPRESSION: No active cardiopulmonary disease. Electronically Signed   By: Adam  Henn M.D.   On: 06/03/2020 11:44   CT HEAD WO CONTRAST  Result Date: 05/30/2020 CLINICAL DATA:  80-year-old male with neurologic deficit. EXAM: CT   HEAD WITHOUT CONTRAST TECHNIQUE: Contiguous axial images were obtained from the base of the skull through the vertex without intravenous contrast. COMPARISON:  None. FINDINGS: Brain: Mild age-related atrophy and moderate chronic microvascular ischemic changes. Left periventricular and white matter corona radiata hypodensity, chronic. There is no acute intracranial hemorrhage. No mass effect or midline shift. No extra-axial fluid collection. Vascular: No hyperdense vessel or unexpected calcification. Skull: Normal. Negative for fracture or focal lesion. Sinuses/Orbits: No acute finding. Other: None  IMPRESSION: 1. No acute intracranial pathology. 2. Age-related atrophy and chronic microvascular ischemic changes. Electronically Signed   By: Arash  Radparvar M.D.   On: 06/16/2020 20:02   CT Abdomen Pelvis W Contrast  Result Date: 06/03/2020 CLINICAL DATA:  Abdominal pain, constipation. EXAM: CT ABDOMEN AND PELVIS WITH CONTRAST TECHNIQUE: Multidetector CT imaging of the abdomen and pelvis was performed using the standard protocol following bolus administration of intravenous contrast. CONTRAST:  100mL OMNIPAQUE IOHEXOL 300 MG/ML  SOLN COMPARISON:  None. FINDINGS: Lower chest: Minimal bibasilar subsegmental atelectasis is noted. Hepatobiliary: No focal liver abnormality is seen. No gallstones, gallbladder wall thickening, or biliary dilatation. Pancreas: Unremarkable. No pancreatic ductal dilatation or surrounding inflammatory changes. Spleen: Calcified splenic granulomata are noted. Moderate splenomegaly is noted. Adrenals/Urinary Tract: Adrenal glands and kidneys are unremarkable. No hydronephrosis or renal obstruction is noted. No renal or ureteral calculi are noted. At least 2 small bladder diverticula are noted posteriorly and superiorly. Stomach/Bowel: Stomach appears normal. There is no evidence of bowel obstruction or inflammation. Status post appendectomy. Vascular/Lymphatic: Aortic atherosclerosis. No enlarged abdominal or pelvic lymph nodes. Reproductive: Mild prostatic enlargement is noted. Other: No abdominal wall hernia or abnormality. No abdominopelvic ascites. Musculoskeletal: No acute or significant osseous findings. IMPRESSION: 1. Moderate splenomegaly. 2. At least 2 small bladder diverticula are noted posteriorly and superiorly. 3. Mild prostatic enlargement. 4. Aortic atherosclerosis. Aortic Atherosclerosis (ICD10-I70.0). Electronically Signed   By: James  Green Jr M.D.   On: 06/03/2020 14:32   CT BIOPSY  Result Date: 06/12/2020 INDICATION: AML EXAM: CT GUIDED RIGHT ILIAC BONE MARROW  ASPIRATION AND CORE BIOPSY Date:  06/12/2020 06/12/2020 10:17 am Radiologist:  M. Trevor Shick, MD Guidance:  CT FLUOROSCOPY TIME:  Fluoroscopy Time: NONE. MEDICATIONS: 1% lidocaine local ANESTHESIA/SEDATION: 2.0 mg IV Versed; 50 mcg IV Fentanyl Moderate Sedation Time:  10 minutes The patient was continuously monitored during the procedure by the interventional radiology nurse under my direct supervision. CONTRAST:  None. COMPLICATIONS: None PROCEDURE: Informed consent was obtained from the patient following explanation of the procedure, risks, benefits and alternatives. The patient understands, agrees and consents for the procedure. All questions were addressed. A time out was performed. The patient was positioned prone and non-contrast localization CT was performed of the pelvis to demonstrate the iliac marrow spaces. Maximal barrier sterile technique utilized including caps, mask, sterile gowns, sterile gloves, large sterile drape, hand hygiene, and Betadine prep. Under sterile conditions and local anesthesia, an 11 gauge coaxial bone biopsy needle was advanced into the right iliac marrow space. Needle position was confirmed with CT imaging. Initially, bone marrow aspiration was performed. Next, the 11 gauge outer cannula was utilized to obtain a right iliac bone marrow core biopsy. Needle was removed. Hemostasis was obtained with compression. The patient tolerated the procedure well. Samples were prepared with the cytotechnologist. No immediate complications. IMPRESSION: CT guided right iliac bone marrow aspiration and core biopsy. Electronically Signed   By: M.  Shick M.D.   On: 06/12/2020 11:45   DG CHEST PORT 1 VIEW    Result Date: 06/12/2020 CLINICAL DATA:  Dyspnea. EXAM: PORTABLE CHEST 1 VIEW COMPARISON:  June 08, 2020. FINDINGS: The heart size and mediastinal contours are within normal limits. No pneumothorax or pleural effusion is noted. Mildly increased bilateral lung opacities are noted, right greater  than left, concerning for multifocal pneumonia. The visualized skeletal structures are unremarkable. IMPRESSION: Mildly increased bilateral lung opacities are noted, right greater than left, concerning for multifocal pneumonia. Electronically Signed   By: James  Green Jr M.D.   On: 06/12/2020 11:13   DG Chest Portable 1 View  Result Date: 06/17/2020 CLINICAL DATA:  80-year-old male with shortness of breath. EXAM: PORTABLE CHEST 1 VIEW COMPARISON:  Chest radiograph dated 06/03/2020. FINDINGS: No focal consolidation, pleural effusion, or pneumothorax. The cardiac silhouette is within limits. No acute osseous pathology. IMPRESSION: No active disease. Electronically Signed   By: Arash  Radparvar M.D.   On: 05/23/2020 21:56   CT BONE MARROW BIOPSY & ASPIRATION  Result Date: 06/12/2020 INDICATION: AML EXAM: CT GUIDED RIGHT ILIAC BONE MARROW ASPIRATION AND CORE BIOPSY Date:  06/12/2020 06/12/2020 10:17 am Radiologist:  M. Trevor Shick, MD Guidance:  CT FLUOROSCOPY TIME:  Fluoroscopy Time: NONE. MEDICATIONS: 1% lidocaine local ANESTHESIA/SEDATION: 2.0 mg IV Versed; 50 mcg IV Fentanyl Moderate Sedation Time:  10 minutes The patient was continuously monitored during the procedure by the interventional radiology nurse under my direct supervision. CONTRAST:  None. COMPLICATIONS: None PROCEDURE: Informed consent was obtained from the patient following explanation of the procedure, risks, benefits and alternatives. The patient understands, agrees and consents for the procedure. All questions were addressed. A time out was performed. The patient was positioned prone and non-contrast localization CT was performed of the pelvis to demonstrate the iliac marrow spaces. Maximal barrier sterile technique utilized including caps, mask, sterile gowns, sterile gloves, large sterile drape, hand hygiene, and Betadine prep. Under sterile conditions and local anesthesia, an 11 gauge coaxial bone biopsy needle was advanced into the right  iliac marrow space. Needle position was confirmed with CT imaging. Initially, bone marrow aspiration was performed. Next, the 11 gauge outer cannula was utilized to obtain a right iliac bone marrow core biopsy. Needle was removed. Hemostasis was obtained with compression. The patient tolerated the procedure well. Samples were prepared with the cytotechnologist. No immediate complications. IMPRESSION: CT guided right iliac bone marrow aspiration and core biopsy. Electronically Signed   By: M.  Shick M.D.   On: 06/12/2020 11:45    ASSESSMENT AND PLAN: 79 y.o. 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  02/23/18 BM Bx which revealed hypercellular bone marrow with dyspoietic changes 02/23/18 BM Flow Cytometry did not reveal a significant CD34 positive blastic population  #2  Thrombocytopenia, with significant worsening  #3 constipation/ileus PLAN:  -Discussed lab work today 06/12/2020 -hemoglobin better after transfusion that is gradually trending downwards and is down to 8. -Platelets trending down to 16k -Transfuse PRBC for hemoglobin less than 8 or if symptomatic. -Transfuse platelets prophylactically for counts less than 10k or if bleeding. -Patient has had a bone marrow biopsy today and we should hopefully have some results in the next 1 or 2 days. -Continue goals of care discussion -Increase senna S to 2 tablets twice a day and ordered magnesium citrate 1 bottle x1. -He does have fleets enema ordered   if needed. -We will follow up with the bone marrow biopsy results. -Appreciate excellent hospital medicine cares.  Sullivan Lone, MD MS 06/12/20

## 2020-06-12 NOTE — Progress Notes (Signed)
   06/12/20 0904  Assess: MEWS Score  Temp 98.3 F (36.8 C)  BP 130/68  Pulse Rate 94  Resp (!) 32  SpO2 93 %  O2 Device Nasal Cannula  O2 Flow Rate (L/min) 4 L/min  Assess: MEWS Score  MEWS Temp 0  MEWS Systolic 0  MEWS Pulse 0  MEWS RR 2  MEWS LOC 0  MEWS Score 2  MEWS Score Color Yellow  Assess: if the MEWS score is Yellow or Red  Were vital signs taken at a resting state? Yes  Focused Assessment Change from prior assessment (see assessment flowsheet)  Early Detection of Sepsis Score *See Row Information* High  MEWS guidelines implemented *See Row Information* Yes  Treat  MEWS Interventions Escalated (See documentation below)  Take Vital Signs  Increase Vital Sign Frequency  Yellow: Q 2hr X 2 then Q 4hr X 2, if remains yellow, continue Q 4hrs  Escalate  MEWS: Escalate Yellow: discuss with charge nurse/RN and consider discussing with provider and RRT  Notify: Charge Nurse/RN  Name of Charge Nurse/RN Notified Abby D.  Date Charge Nurse/RN Notified 06/12/20  Time Charge Nurse/RN Notified [id number]  Notify: Provider  Provider Name/Title Alfredia Ferguson, MD  Date Provider Notified 06/12/20  Time Provider Notified [phone]  Notification Type Face-to-face  Notification Reason Change in status  Provider response At bedside  Date of Provider Response 06/12/20  Time of Provider Response [phone]

## 2020-06-12 NOTE — Progress Notes (Addendum)
PROGRESS NOTE    Steve Maldonado  FYB:[id number] DOB: 1940-09-02 DOA: 05/28/2020 PCP: Burnard Bunting, MD   Brief Narrative:  The patient is an 80 year old Caucasian male with a past medical history significant for but not limited to CAD, hypertension, history of CVA, type 2 diabetes mellitus, mild renal insufficiency and MDS who presented to the emergency department for the evaluation of weakness.  He is noted to be profoundly anemic and patient was dyspneic on exertion.  Of note he had a smear that showed greater than 20% blasts and likely has acute leukemia.  Dr. Claiborne Billings has been following and plan is for bone marrow biopsy today.  He continues to feel fatigued and continues mention difficulty breathing with minimal exertion with talking.  Chest x-ray today reveals a multifocal pneumonia so he has been started on CAP coverage with IV doxycycline and IV ceftriaxone.  Patient underwent a bone marrow biopsy today and would like the results of the bone marrow biopsy prior to making a final decision of whether or not to transition to comfort care.  Still has not had appropriate bowel movements and laxatives have been increased.  Assessment & Plan:   Principal Problem:   Anemia Active Problems:   Diabetes mellitus type 2 in nonobese (HCC)   Thrombocytopenia (HCC)   History of CVA in adulthood   Coronary artery disease of native artery of native heart with stable angina pectoris (HCC)   Chronic kidney disease, stage 3a (HCC)   Prolonged QT interval   Hypernatremia   Physical debility   Coagulopathy (HCC)   Symptomatic anemia   AML (acute myeloblastic leukemia) (HCC)   Pancytopenia (HCC)  Acute Myeloid Leukemia/History of Myelodysplastic Syndrome -Patient has been previously followed by Dr. Irene Limbo for his MDS.   -His counts were stable as of October 2021.  He did not require any blood transfusions.   -Came in with generalized weakness and fatigue and was noted to be significantly more anemic and  thrombocytopenic than baseline.  He was transfused 2 units of blood for symptomatic anemia. -Peripheral smear does suggest more than 20% blasts raising concern for transformation to acute myeloid leukemia.  -Dr. Irene Limbo was subsequently consulted and following  -Current Plan is for a bone marrow biopsy and that is today 06/12/20.   -Dr. Irene Limbo has discussed goals of care with patient.  Further Care per Heme/onc -Continue to monitor for now.  -Leukocytosis was likely due to AML and has gone from 15.8 -> 20.3 -Per Oncology: "We would have to consider possible hypomethylating agent plus or minus venetoclax and significant burden of supportive transfusions OR best supportive cares through hospice" -Further decisions to be made pending Bx Results   Symptomatic anemia with profound generalized weakness/fatigue -Patient symptoms were initially thought to be due to his significant anemia.   -He was transfused 2 units of PRBC.  Has not noticed much improvement in his symptoms.   -See above discussion as well.  No overt bleeding noted.   -Anemia panel did not show any clear-cut deficiencies and showed an iron level of 55, U IBC 152, TIBC of 207, saturation ratios of 27%, ferritin level 309, folate level of 6.3, and vitamin B12 level of 1840 -Hemoglobin noted to be stable this morning. -Patient's hemoglobin/hematocrit is now gone from 8.3/27.0 and is now 8.0/26.2 -Continues to be quite weak and fatigued.   -Unsafe to go back home since he does live by himself.   -Will likely need skilled nursing facility for rehab depending on  how he does here in the hospital.  Multifocal Pneumonia, poA -Initial CXR showed "No focal consolidation, pleural effusion, or pneumothorax. The cardiac silhouette is within limits. No acute osseous pathology." -Repeat CXR showed "Mildly increased bilateral lung opacities are noted, right greater than left, concerning for multifocal pneumonia." -Start Abx Coverage as above -C/w  Xopenex/Atrovent q6h, Guaifenesin 1200 mg po BID, Flutter Valve and Incentive Spirometry  -SpO2: 94 % O2 Flow Rate (L/min): 4 L/min -Continue to monitor respiratory status very carefully and maintain saturations greater than 90% -Continue supplemental oxygen via nasal cannula and wean O2 as tolerated -Check BNP and ECHOCardiogram to r/o Heart Failure -Strict I's and O's and Daily Weights; Patient is -3.230 Liters  -WBC trended up to 20.3 -Repeat chest x-ray and will need amatory home O2 Screen prior to discharge -If Respiratory Status is not improving will obtain a Pulmonary Evaluation but in the interim will need to get a CT of the Chest w/o Contrast  Thrombocytopenia -Most likely a result of his AML.   -Counts noted to be even lower today at 16.   -No bleeding noted.   -Recommendation is for transfusion if it drops below 10,000 or if he has bleeding episodes.  Coagulopathy -INR was noted to be elevated at 1.5.   -PTT is normal.  Could be due to nutritional deficiencies.   -No history of liver disease. Vit K was ordered. -Continue to Monitor and repeat in the AM   Acute on chronic kidney disease stage IIIa Metabolic Acidosis  -Baseline renal function appears to be around 1.2.  Came in with creatinine of 1.5-1.6.  Renal function worsened likely due to dehydration and has now subsequently improved and BUN/creatinine today is now 28/1.30. (GFR is now 68) -He does have a small Acidosis with a CO2 of 19, AG of 9, and Chloride Level of 110 -He was given IV fluids with improvement in creatinine today.  Monitor urine output.  Avoid nephrotoxic agents.    Constipation -C/w Bisacodyl 10 mg RC Daily Prn, Miralax 17 grams po BID, and Senna-Docusate 2 tab po BID -Will need a Sodium Phosphate Enema DC Daily PRN Severe Constipation  Hypernatremia -Came in with sodium of 151 and was hydrated.   -Sodium level has improved and has normalized and is 138  Insulin-dependent diabetes mellitus  type 2 -HbA1c 6.3 and repeat 5.9.  Patient remains on Lantus and SSI.   -CBGs are reasonably well controlled and ranging from 159-260 -Continue to Monitor Blood Sugars carefully   History of Coronary Artery Disease -Stable.  -Noted to be on statin.  Prolonged QT interval -QTC 531 ms.   -Continue to Monitor and Replete Electrolytes.   -Magnesium was 2.6 on last check.  Potassium is now 4.1 -Repeat EKG In the AM   Hyperbilirubinemia -Patient's T Bili has been elevated and is 1.3 yesterday and today -Continue to Monitor and Trend -Repeat CMP in the AM  DVT prophylaxis: SCDs given Thromobocytopenia and Anemia  Code Status: DO NOT RESUSCITATE Family Communication: Attempted to Contact Point of Contact Dr. Rebekah Chesterfield and he did not answer his phone when called at 5:23 today  Disposition Plan: Pending further clinical improvement and evaluation by PT/OT; PT/OT recommending SNF and patient acceptable to go  Status is: Inpatient  Remains inpatient appropriate because:Unsafe d/c plan, IV treatments appropriate due to intensity of illness or inability to take PO and Inpatient level of care appropriate due to severity of illness   Dispo:  Patient From: Home  Planned Disposition:  [place]  Medically stable for discharge: No    Consultants:   Medical Oncology  Interventional Radiology   Procedures: CT BM ASP AND CORE  06/12/20  Antimicrobials:  Anti-infectives (From admission, onward)   None        Subjective: Seen and examined at bedside and was extremely dyspneic still and complained of shortness of breath with an exertion. No CP but was hungry. Denied any lightheadedness or dizziness.  No other concerns or complaints at this time.  Objective: Vitals:   06/11/20 0546 06/11/20 1240 06/11/20 2028 06/12/20 0538  BP:  127/63 127/76 124/68  Pulse:  (!) 105 67 64  Resp:  20 (!) 22 20  Temp:  99.7 F (37.6 C) 99.5 F (37.5 C) 98.9 F (37.2 C)  TempSrc:  Oral   Oral  SpO2: 94% 93% 92% (!) 89%  Weight: 78.9 kg     Height:        Intake/Output Summary (Last 24 hours) at 06/12/2020 0736 Last data filed at 06/12/2020 0500 Gross per 24 hour  Intake 120 ml  Output 1975 ml  Net -1855 ml   Filed Weights   06/09/20 0133 06/10/20 0500 06/11/20 0546  Weight: 77.3 kg 77.6 kg 78.9 kg   Examination: Physical Exam:  Constitutional: Thin elderly chronically ill-appearing Caucasian male currently in mild distress appears dyspneic and uncomfortable Eyes: Lids and conjunctivae normal, sclerae anicteric  ENMT: External Ears, Nose appear normal. Grossly normal hearing.  Neck: Appears normal, supple, no cervical masses, normal ROM, no appreciable thyromegaly; no JVD Respiratory: Diminished to auscultation bilaterally, no wheezing, rales, rhonchi or crackles. Normal respiratory effort and patient is not tachypenic. No accessory muscle use.  Has slightly labored breathing and he is dyspneic wearing supplemental oxygen via nasal cannula Cardiovascular: RRR, no murmurs / rubs / gallops. S1 and S2 auscultated. No carotid bruits.  Abdomen: Soft, non-tender, non-distended. Bowel sounds positive.  GU: Deferred. Musculoskeletal: No clubbing / cyanosis of digits/nails. No joint deformity upper and lower extremities.  Skin: No rashes, lesions, ulcers on limited skin evaluation. No induration; Warm and dry.  Neurologic: CN 2-12 grossly intact with no focal deficits. Romberg sign cerebellar reflexes not assessed.  Psychiatric: Normal judgment and insight. Alert and oriented x 3.  Anxious mood and appropriate affect.   Data Reviewed: I have personally reviewed following labs and imaging studies  CBC: Recent Labs  Lab 05/26/2020 1824 06/09/20 0539 06/10/20 0848 06/11/20 0807 06/12/20 0523  WBC 10.8* 13.0* 13.4* 15.8* 20.3*  NEUTROABS 5.8  --   --  8.1* 8.1*  HGB 7.4* 7.3* 8.6* 8.3* 8.0*  HCT 26.1* 25.6* 28.2* 27.0* 26.2*  MCV 102.0* 102.4* 98.9 97.8 97.8  PLT 31*  35* 25* 16* 16*   Basic Metabolic Panel: Recent Labs  Lab 05/22/2020 1824 06/09/20 0539 06/10/20 0457 06/11/20 0550 06/12/20 0523  NA 149* 145 139 140 138  K 3.4* 3.8 4.4 4.5 4.1  CL 112* 110 109 111 110  CO2 24 24 23  20* 19*  GLUCOSE 210* 242* 174* 182* 174*  BUN 32* 34* 36* 28* 28*  CREATININE 1.55* 1.69* 1.80* 1.50* 1.30*  CALCIUM 8.0* 7.9* 8.0* 8.5* 8.4*  MG  --  2.6*  --   --   --    GFR: Estimated Creatinine Clearance: 50.6 mL/min (A) (by C-G formula based on SCr of 1.3 mg/dL (H)). Liver Function Tests: Recent Labs  Lab 05/25/2020 1824 06/09/20 0539 06/10/20 0457 06/11/20 0550 06/12/20 0523  AST 23 20 23  26 21  ALT 20 20 21 27 26   ALKPHOS 78 88 96 126 151*  BILITOT 1.5* 1.4* 1.3* 1.3* 1.3*  PROT 6.0* 5.8* 5.4* 5.8* 5.6*  ALBUMIN 3.4* 3.3* 3.0* 3.1* 2.9*   No results for input(s): LIPASE, AMYLASE in the last 168 hours. No results for input(s): AMMONIA in the last 168 hours. Coagulation Profile: Recent Labs  Lab 05/22/2020 1824  INR 1.5*   Cardiac Enzymes: Recent Labs  Lab 06/09/20 0539  CKTOTAL 52   BNP (last 3 results) No results for input(s): PROBNP in the last 8760 hours. HbA1C: Recent Labs    06/10/20 0848  HGBA1C 5.9*   CBG: Recent Labs  Lab 06/10/20 2157 06/11/20 0731 06/11/20 1119 06/11/20 1625 06/11/20 2137  GLUCAP 197* 159* 260* 182* 200*   Lipid Profile: No results for input(s): CHOL, HDL, LDLCALC, TRIG, CHOLHDL, LDLDIRECT in the last 72 hours. Thyroid Function Tests: No results for input(s): TSH, T4TOTAL, FREET4, T3FREE, THYROIDAB in the last 72 hours. Anemia Panel: No results for input(s): VITAMINB12, FOLATE, FERRITIN, TIBC, IRON, RETICCTPCT in the last 72 hours. Sepsis Labs: No results for input(s): PROCALCITON, LATICACIDVEN in the last 168 hours.  Recent Results (from the past 240 hour(s))  Resp Panel by RT-PCR (Flu A&B, Covid) Nasopharyngeal Swab     Status: None   Collection Time: 05/24/2020 10:27 PM   Specimen:  Nasopharyngeal Swab; Nasopharyngeal(NP) swabs in vial transport medium  Result Value Ref Range Status   SARS Coronavirus 2 by RT PCR NEGATIVE NEGATIVE Final    Comment: (NOTE) SARS-CoV-2 target nucleic acids are NOT DETECTED.  The SARS-CoV-2 RNA is generally detectable in upper respiratory specimens during the acute phase of infection. The lowest concentration of SARS-CoV-2 viral copies this assay can detect is 138 copies/mL. A negative result does not preclude SARS-Cov-2 infection and should not be used as the sole basis for treatment or other patient management decisions. A negative result may occur with  improper specimen collection/handling, submission of specimen other than nasopharyngeal swab, presence of viral mutation(s) within the areas targeted by this assay, and inadequate number of viral copies(<138 copies/mL). A negative result must be combined with clinical observations, patient history, and epidemiological information. The expected result is Negative.  Fact Sheet for Patients:  EntrepreneurPulse.com.au  Fact Sheet for Healthcare Providers:  IncredibleEmployment.be  This test is no t yet approved or cleared by the [place] FDA and  has been authorized for detection and/or diagnosis of SARS-CoV-2 by FDA under an Emergency Use Authorization (EUA). This EUA will remain  in effect (meaning this test can be used) for the duration of the COVID-19 declaration under Section 564(b)(1) of the Act, 21 U.S.C.section 360bbb-3(b)(1), unless the authorization is terminated  or revoked sooner.       Influenza A by PCR NEGATIVE NEGATIVE Final   Influenza B by PCR NEGATIVE NEGATIVE Final    Comment: (NOTE) The Xpert Xpress SARS-CoV-2/FLU/RSV plus assay is intended as an aid in the diagnosis of influenza from Nasopharyngeal swab specimens and should not be used as a sole basis for treatment. Nasal washings and aspirates are unacceptable for  Xpert Xpress SARS-CoV-2/FLU/RSV testing.  Fact Sheet for Patients: EntrepreneurPulse.com.au  Fact Sheet for Healthcare Providers: IncredibleEmployment.be  This test is not yet approved or cleared by the [place] FDA and has been authorized for detection and/or diagnosis of SARS-CoV-2 by FDA under an Emergency Use Authorization (EUA). This EUA will remain in effect (meaning this test can be used) for the duration of  the COVID-19 declaration under Section 564(b)(1) of the Act, 21 U.S.C. section 360bbb-3(b)(1), unless the authorization is terminated or revoked.  Performed at [facility], [place] [address], [place], [place] [address]      RN Pressure Injury Documentation:     Estimated body mass index is 21.74 kg/m as calculated from the following:   Height as of this encounter: 6' 3"  (1.905 m).   Weight as of this encounter: 78.9 kg.  Malnutrition Type:  Nutrition Problem: Increased nutrient needs Etiology: acute illness   Malnutrition Characteristics:  Signs/Symptoms: estimated needs   Nutrition Interventions:  Interventions: Ensure Enlive (each supplement provides 350kcal and 20 grams of protein),Prostat   Radiology Studies: No results found.   Scheduled Meds: . (feeding supplement) PROSource Plus  30 mL Oral Daily  . sodium chloride   Intravenous Once  . feeding supplement  237 mL Oral Q24H  . insulin aspart  0-5 Units Subcutaneous QHS  . insulin aspart  0-9 Units Subcutaneous TID WC  . insulin glargine  10 Units Subcutaneous Daily  . melatonin  3 mg Oral QHS  . polyethylene glycol  17 g Oral BID  . rosuvastatin  20 mg Oral QPM  . senna-docusate  2 tablet Oral QHS   Continuous Infusions: . sodium chloride 50 mL/hr at 06/11/20 1215  . sodium chloride 10 mL/hr at 05/24/2020 2345    LOS: 2 days   Kerney Elbe, DO [facility] PAGER is on AMION  If 7PM-7AM, please contact  night-coverage [email or web address]

## 2020-06-12 NOTE — Plan of Care (Signed)

## 2020-06-12 NOTE — Progress Notes (Signed)
   06/12/20 1125  Assess: MEWS Score  Temp 98.3 F (36.8 C)  BP 106/68  Pulse Rate (!) 113  Resp (!) 30  SpO2 92 %  O2 Device Nasal Cannula  O2 Flow Rate (L/min) 4 L/min  Assess: MEWS Score  MEWS Temp 0  MEWS Systolic 0  MEWS Pulse 2  MEWS RR 2  MEWS LOC 0  MEWS Score 4  MEWS Score Color Red  Assess: if the MEWS score is Yellow or Red  Were vital signs taken at a resting state? Yes  Focused Assessment Change from prior assessment (see assessment flowsheet)  Early Detection of Sepsis Score *See Row Information* High  MEWS guidelines implemented *See Row Information* Yes  Treat  MEWS Interventions Escalated (See documentation below)  Take Vital Signs  Increase Vital Sign Frequency  Red: Q 1hr X 4 then Q 4hr X 4, if remains red, continue Q 4hrs  Escalate  MEWS: Escalate Red: discuss with charge nurse/RN and provider, consider discussing with RRT  Notify: Charge Nurse/RN  Name of Charge Nurse/RN Notified Abby D.  Date Charge Nurse/RN Notified 06/12/20  Time Charge Nurse/RN Notified 1206  Notify: Provider  Provider Name/Title Alfredia Ferguson, MD  Date Provider Notified 06/12/20  Time Provider Notified 1206  Notification Type Page  Notification Reason Change in status

## 2020-06-12 NOTE — Plan of Care (Signed)
  Problem: Education: Goal: Knowledge of General Education information will improve Description: Including pain rating scale, medication(s)/side effects and non-pharmacologic comfort measures Outcome: Progressing   Problem: Clinical Measurements: Goal: Ability to maintain clinical measurements within normal limits will improve Outcome: Progressing Goal: Will remain free from infection Outcome: Progressing Goal: Cardiovascular complication will be avoided Outcome: Progressing   Problem: Nutrition: Goal: Adequate nutrition will be maintained Outcome: Progressing   Problem: Coping: Goal: Level of anxiety will decrease Outcome: Progressing   Problem: Elimination: Goal: Will not experience complications related to bowel motility Outcome: Progressing Goal: Will not experience complications related to urinary retention Outcome: Progressing   Problem: Pain Managment: Goal: General experience of comfort will improve Outcome: Progressing   Problem: Safety: Goal: Ability to remain free from injury will improve Outcome: Progressing   Problem: Skin Integrity: Goal: Risk for impaired skin integrity will decrease Outcome: Progressing

## 2020-06-12 NOTE — Progress Notes (Signed)
Occupational Therapy Treatment Patient Details Name: Steve Maldonado MRN: [id number] DOB: January 19, 1941 Today's Date: 06/12/2020    History of present illness 80 y.o. male with medical history significant for CAD, hypertension, history of CVA, type 2 diabetes mellitus, mild renal insufficiency, and MDS, presented to the emergency department 3/19/22for evaluation of weakness, inability to walk. Noted to be profoundly anemic.  Received 2 units. Patient with dyspnea on exertion. Was in ED  06/03/20 for SOB, constipation. weakness. S/p bone biopsy 3/23   OT comments  Patient with global weakness and LE buckling during therapy session. Attempted sit to stand from edge of bed twice with bed elevated however patient unable to extend knees or trunk upright/unable to support body weight therefore sat back onto bed. Patient also needing cues for sequencing hand placement and for safety due to patient pushing walker too far away with attempted standing. With use of stedy and max A patient able to transfer to recliner chair. Patient required max cues to breath in through his nose vs mouth, O2 reading 91% on 4L, HR 127 post transfer. Cont with POC.   Follow Up Recommendations  SNF;Supervision/Assistance - 24 hour    Equipment Recommendations  Other (comment) (TBD)       Precautions / Restrictions Precautions Precautions: Fall       Mobility Bed Mobility Overal bed mobility: Needs Assistance Bed Mobility: Rolling;Sidelying to Sit Rolling: Min assist Sidelying to sit: Max assist;HOB elevated       General bed mobility comments: verbal cues to sequence, having trouble with follow through to roll onto his side and stop crossing LEs. max A to upright trunk from sidelying    Transfers Overall transfer level: Needs assistance   Transfers: Sit to/from Stand Sit to Stand: Max assist;From elevated surface         General transfer comment: please see toilet transfer in ADL section    Balance Overall  balance assessment: Needs assistance Sitting-balance support: Feet supported;Bilateral upper extremity supported Sitting balance-Leahy Scale: Poor     Standing balance support: Bilateral upper extremity supported Standing balance-Leahy Scale: Zero Standing balance comment: needed max A and use of stedy to complete transfer                           ADL either performed or assessed with clinical judgement   ADL Overall ADL's : Needs assistance/impaired             Lower Body Bathing: Set up;Sit to/from stand;Maximal assistance;Bed level Lower Body Bathing Details (indicate cue type and reason): patient able to was peri area while reclined in bed, however would need increased assist from seated or standing position due to LE weakness/ zero balance         Toilet Transfer: Maximal assistance (stedy) Toilet Transfer Details (indicate cue type and reason): to recliner, attempted twice to power up to standing from elevated bed height however patient unable to fully extend LEs or upright trunk, very tremulous. with max A patient able to transfer to chair using stedy         Functional mobility during ADLs: Maximal assistance General ADL Comments: patient is limited by generalized weakness dyspnea on exertion and very limited balance. Checked O2 twice during session patient reading between 91-94% on 4L. Needs max cues to breath through his nose vs mouth breathing               Cognition Arousal/Alertness: Awake/alert Behavior During Therapy: [facility]  for tasks assessed/performed Overall Cognitive Status: No family/caregiver present to determine baseline cognitive functioning                                 General Comments: patient having difficulty following directions to sequence bed mobility and functional transfer. needing directions repeated.                   Pertinent Vitals/ Pain       Pain Assessment: Faces Faces Pain Scale: Hurts a little  bit Pain Location: Generalized discomfort Pain Descriptors / Indicators: Grimacing Pain Intervention(s): Monitored during session         Frequency  Min 2X/week        Progress Toward Goals  OT Goals(current goals can now be found in the care plan section)  Progress towards OT goals: Progressing toward goals  Acute Rehab OT Goals Patient Stated Goal: To get stronger OT Goal Formulation: With patient Time For Goal Achievement: 06/24/20 Potential to Achieve Goals: Good ADL Goals Pt Will Perform Grooming: with modified independence;standing Pt Will Perform Upper Body Dressing: with modified independence;sitting Pt Will Perform Lower Body Dressing: with modified independence;sit to/from stand Pt Will Transfer to Toilet: with modified independence;ambulating Pt Will Perform Toileting - Clothing Manipulation and hygiene: with modified independence;sit to/from stand Additional ADL Goal #1: Patient will recall and demo 3 energy conservation techniques in prep for ADLs.  Plan Discharge plan remains appropriate       AM-PAC OT "6 Clicks" Daily Activity     Outcome Measure   Help from another person eating meals?: None Help from another person taking care of personal grooming?: A Little Help from another person toileting, which includes using toliet, bedpan, or urinal?: A Lot Help from another person bathing (including washing, rinsing, drying)?: A Lot Help from another person to put on and taking off regular upper body clothing?: A Lot Help from another person to put on and taking off regular lower body clothing?: A Lot 6 Click Score: 15    End of Session Equipment Utilized During Treatment: Gait belt  OT Visit Diagnosis: Unsteadiness on feet (R26.81);Other abnormalities of gait and mobility (R26.89);Muscle weakness (generalized) (M62.81)   Activity Tolerance Patient limited by fatigue   Patient Left in chair;with call bell/phone within reach;with chair alarm set   Nurse  Communication Mobility status;Need for lift equipment (stedy)        Time: 6387-5643 OT Time Calculation (min): 30 min  Charges: OT General Charges $OT Visit: 1 Visit OT Treatments $Self Care/Home Management : 23-37 mins  Delbert Phenix OT OT pager: [phone]  Rosemary Holms 06/12/2020, 2:41 PM

## 2020-06-13 ENCOUNTER — Inpatient Hospital Stay (HOSPITAL_COMMUNITY): Payer: Medicare PPO

## 2020-06-13 DIAGNOSIS — Z7189 Other specified counseling: Secondary | ICD-10-CM | POA: Diagnosis not present

## 2020-06-13 DIAGNOSIS — C9200 Acute myeloblastic leukemia, not having achieved remission: Secondary | ICD-10-CM | POA: Diagnosis not present

## 2020-06-13 DIAGNOSIS — D689 Coagulation defect, unspecified: Secondary | ICD-10-CM | POA: Diagnosis not present

## 2020-06-13 DIAGNOSIS — D649 Anemia, unspecified: Secondary | ICD-10-CM | POA: Diagnosis not present

## 2020-06-13 DIAGNOSIS — N1831 Chronic kidney disease, stage 3a: Secondary | ICD-10-CM | POA: Diagnosis not present

## 2020-06-13 DIAGNOSIS — R9389 Abnormal findings on diagnostic imaging of other specified body structures: Secondary | ICD-10-CM

## 2020-06-13 DIAGNOSIS — D696 Thrombocytopenia, unspecified: Secondary | ICD-10-CM | POA: Diagnosis not present

## 2020-06-13 DIAGNOSIS — J9601 Acute respiratory failure with hypoxia: Secondary | ICD-10-CM

## 2020-06-13 DIAGNOSIS — R0600 Dyspnea, unspecified: Secondary | ICD-10-CM

## 2020-06-13 DIAGNOSIS — C9202 Acute myeloblastic leukemia, in relapse: Secondary | ICD-10-CM | POA: Diagnosis not present

## 2020-06-13 LAB — COMPREHENSIVE METABOLIC PANEL
ALT: 23 U/L (ref 0–44)
AST: 20 U/L (ref 15–41)
Albumin: 2.8 g/dL — ABNORMAL LOW (ref 3.5–5.0)
Alkaline Phosphatase: 144 U/L — ABNORMAL HIGH (ref 38–126)
Anion gap: 10 (ref 5–15)
BUN: 25 mg/dL — ABNORMAL HIGH (ref 8–23)
CO2: 18 mmol/L — ABNORMAL LOW (ref 22–32)
Calcium: 8 mg/dL — ABNORMAL LOW (ref 8.9–10.3)
Chloride: 110 mmol/L (ref 98–111)
Creatinine, Ser: 1.49 mg/dL — ABNORMAL HIGH (ref 0.61–1.24)
GFR, Estimated: 47 mL/min — ABNORMAL LOW (ref >60)
Glucose, Bld: 179 mg/dL — ABNORMAL HIGH (ref 70–99)
Potassium: 3.9 mmol/L (ref 3.5–5.1)
Sodium: 138 mmol/L (ref 135–145)
Total Bilirubin: 0.9 mg/dL (ref 0.3–1.2)
Total Protein: 5.6 g/dL — ABNORMAL LOW (ref 6.5–8.1)

## 2020-06-13 LAB — CBC WITH DIFFERENTIAL/PLATELET
Abs Immature Granulocytes: 0.5 10*3/uL — ABNORMAL HIGH (ref 0.00–0.07)
Basophils Absolute: 0 10*3/uL (ref 0.0–0.1)
Basophils Relative: 0 %
Blasts: 27 %
Eosinophils Absolute: 0 10*3/uL (ref 0.0–0.5)
Eosinophils Relative: 0 %
HCT: 24 % — ABNORMAL LOW (ref 39.0–52.0)
Hemoglobin: 7.3 g/dL — ABNORMAL LOW (ref 13.0–17.0)
Lymphocytes Relative: 15 %
Lymphs Abs: 3.8 10*3/uL (ref 0.7–4.0)
MCH: 29.8 pg (ref 26.0–34.0)
MCHC: 30.4 g/dL (ref 30.0–36.0)
MCV: 98 fL (ref 80.0–100.0)
Monocytes Absolute: 1 10*3/uL (ref 0.1–1.0)
Monocytes Relative: 4 %
Myelocytes: 2 %
Neutro Abs: 13.3 10*3/uL — ABNORMAL HIGH (ref 1.7–7.7)
Neutrophils Relative %: 52 %
Platelets: 16 10*3/uL — CL (ref 150–400)
RBC: 2.45 MIL/uL — ABNORMAL LOW (ref 4.22–5.81)
RDW: 17.9 % — ABNORMAL HIGH (ref 11.5–15.5)
WBC: 25.5 10*3/uL — ABNORMAL HIGH (ref 4.0–10.5)
nRBC: 1.9 % — ABNORMAL HIGH (ref 0.0–0.2)

## 2020-06-13 LAB — GLUCOSE, CAPILLARY
Glucose-Capillary: 153 mg/dL — ABNORMAL HIGH (ref 70–99)
Glucose-Capillary: 160 mg/dL — ABNORMAL HIGH (ref 70–99)
Glucose-Capillary: 277 mg/dL — ABNORMAL HIGH (ref 70–99)
Glucose-Capillary: 203 mg/dL — ABNORMAL HIGH (ref 70–99)
Glucose-Capillary: 189 mg/dL — ABNORMAL HIGH (ref 70–99)

## 2020-06-13 LAB — ECHOCARDIOGRAM COMPLETE
Height: 75 in
S' Lateral: 2.7 cm
Weight: 2783.09 oz

## 2020-06-13 LAB — PREPARE RBC (CROSSMATCH)

## 2020-06-13 LAB — SURGICAL PATHOLOGY

## 2020-06-13 LAB — MAGNESIUM: Magnesium: 2.2 mg/dL (ref 1.7–2.4)

## 2020-06-13 LAB — BRAIN NATRIURETIC PEPTIDE: B Natriuretic Peptide: 51.5 pg/mL (ref 0.0–100.0)

## 2020-06-13 LAB — PROCALCITONIN: Procalcitonin: 0.81 ng/mL

## 2020-06-13 LAB — PHOSPHORUS: Phosphorus: 2.1 mg/dL — ABNORMAL LOW (ref 2.5–4.6)

## 2020-06-13 MED ORDER — FUROSEMIDE 10 MG/ML IJ SOLN
40.0000 mg | Freq: Once | INTRAMUSCULAR | Status: AC
  Administered 2020-06-13: 40 mg via INTRAVENOUS
  Filled 2020-06-13: qty 4

## 2020-06-13 MED ORDER — TEMAZEPAM 7.5 MG PO CAPS
7.5000 mg | ORAL_CAPSULE | Freq: Once | ORAL | Status: AC
  Administered 2020-06-13: 7.5 mg via ORAL

## 2020-06-13 MED ORDER — SODIUM CHLORIDE 0.9% IV SOLUTION: Freq: Once | INTRAVENOUS | Status: AC

## 2020-06-13 MED ORDER — HYDROXYZINE HCL 50 MG/ML IM SOLN
50.0000 mg | Freq: Once | INTRAMUSCULAR | Status: AC
  Administered 2020-06-13: 50 mg via INTRAMUSCULAR
  Filled 2020-06-13: qty 1

## 2020-06-13 NOTE — Progress Notes (Addendum)
I was called by the hospitalist this evening.  Apparently, there is some concerns regarding Steve Maldonado and his status with respect to comfort care.  Dr. Irene Limbo has been following him.  Dr. Irene Limbo saw him today.  He left a very thorough note. I am not sure as to what the reservations are regarding his end of life care.  Steve Maldonado is having some dyspnea.  He is on a cardiac monitor.  We talked about the cardiac monitor.  I am really not sure this is going to be that necessary for him.  We talked about this again.  He has no issues with being taken off the cardiac monitor.  He would like something to sleep.  I think this would not be a bad idea.  I will go ahead and give him some Restoril 7.5 mg.  Apparently, he has a sister who lives in [place], [place] that is supposed to visit him.  He says that she is currently in visit on Friday.  He understands very well that he may not make it through the night.  He is okay with the fact that he may not make it through the evening.  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  Steve Maldonado is dealing with pneumonia.  I feel that his comfort is paramount.  I really do not see the anything else that would needs to be done right now.  Ultimately, he may need to be placed onto a morphine infusion if the dyspnea becomes more of a problem.  We talked about this possibility.  He understands what a morphine infusion is and and what the implications of this are.  Again, he just wants to have comfort and dignity.  I know that the staff on [address] are doing a great job with him.  Hopefully, Steve Maldonado will have a quiet and restful night.  He is at peace with his situation and definitely does not have any reservations about passing on if this does  happen this evening.  Lattie Haw, MD  2 Timothy 4:16-18

## 2020-06-13 NOTE — Progress Notes (Signed)
PROGRESS NOTE    Steve Maldonado  ACZ:[id number] DOB: 01-18-1941 DOA: 06/11/2020 PCP: Burnard Bunting, MD   Brief Narrative:  The patient is an 80 year old Caucasian male with a past medical history significant for but not limited to CAD, hypertension, history of CVA, type 2 diabetes mellitus, mild renal insufficiency and MDS who presented to the emergency department for the evaluation of weakness.  He is noted to be profoundly anemic and patient was dyspneic on exertion.  Of note he had a smear that showed greater than 20% blasts and likely has acute leukemia.  Dr. Claiborne Billings has been following and plan is for bone marrow biopsy today.  He continues to feel fatigued and continues mention difficulty breathing with minimal exertion with talking.  Chest x-ray today reveals a multifocal pneumonia so he has been started on CAP coverage with IV doxycycline and IV ceftriaxone.  Patient underwent a bone marrow biopsy 06/12/20 and would like the results of the bone marrow biopsy prior to making a final decision of whether or not to transition to comfort care.  Still has not had appropriate bowel movements and laxatives have been increased.  His respiratory status is worsening so we called pulmonary for further evaluation  Assessment & Plan:   Principal Problem:   Anemia Active Problems:   Diabetes mellitus type 2 in nonobese (HCC)   Thrombocytopenia (HCC)   History of CVA in adulthood   Coronary artery disease of native artery of native heart with stable angina pectoris (HCC)   Chronic kidney disease, stage 3a (HCC)   Prolonged QT interval   Hypernatremia   Physical debility   Coagulopathy (HCC)   Symptomatic anemia   AML (acute myeloblastic leukemia) (HCC)   Pancytopenia (HCC)  Acute Myeloid Leukemia/History of Myelodysplastic Syndrome -Patient has been previously followed by Dr. Irene Limbo for his MDS.   -His counts were stable as of October 2021.  He did not require any blood transfusions.   -Came in  with generalized weakness and fatigue and was noted to be significantly more anemic and thrombocytopenic than baseline.  He was transfused 2 units of blood for symptomatic anemia. -Peripheral smear does suggest more than 20% blasts raising concern for transformation to acute myeloid leukemia. Dx confirms Acute Myeloid Leukemia -Dr. Irene Limbo was subsequently consulted and following  -Current Plan is for a bone marrow biopsy and that is today 06/12/20.   -Dr. Irene Limbo has discussed goals of care with patient.  Further Care per Heme/onc -Continue to monitor for now.  -Leukocytosis was likely due to AML and has gone from 15.8 -> 20.3 -> 25.5 -Per Oncology: "We would have to consider possible hypomethylating agent plus or minus venetoclax and significant burden of supportive transfusions OR best supportive cares through hospice" -Further decisions to be made pending Bx Results and apparently Dr. Irene Limbo has spoken with Family about Dx -Likely Will transition to [facility] per Heme/Onc and likely will be Hospice Appropriate   Symptomatic anemia with profound generalized weakness/fatigue -Patient symptoms were initially thought to be due to his significant anemia.   -He was transfused 2 units of PRBC.  Has not noticed much improvement in his symptoms.   -See above discussion as well.  No overt bleeding noted.   -Anemia panel did not show any clear-cut deficiencies and showed an iron level of 55, U IBC 152, TIBC of 207, saturation ratios of 27%, ferritin level 309, folate level of 6.3, and vitamin B12 level of 1840 -Hemoglobin noted to be stable this morning. -  Patient's hemoglobin/hematocrit is now gone from 8.3/27.0 and is now 8.0/26.2 yesterday and today dropped to 7.3/24.8 so we will type and screen and transfuse 1 more PRBCs -Continues to be quite weak and fatigued.   -Unsafe to go back home since he does live by himself.   -Will likely need skilled nursing facility for rehab depending on how he does here in  the hospital but likely will be Comfort Care  Multifocal Pneumonia, poA -Initial CXR showed "No focal consolidation, pleural effusion, or pneumothorax. The cardiac silhouette is within limits. No acute osseous pathology." -Repeat CXR showed "Mildly increased bilateral lung opacities are noted, right greater than left, concerning for multifocal pneumonia." -Start Abx Coverage as above -C/w Xopenex/Atrovent q6h, Guaifenesin 1200 mg po BID, Flutter Valve and Incentive Spirometry  -SpO2: (!) 88 % O2 Flow Rate (L/min): 7 L/min -Continue to monitor respiratory status very carefully and maintain saturations greater than 90% -Continue supplemental oxygen via nasal cannula and wean O2 as tolerated -Check BNP and ECHOCardiogram to r/o Heart Failure -Strict I's and O's and Daily Weights; Patient is - 2.531 Liters  -WBC trended up to 20.3 -Repeat chest x-ray and will need amatory home O2 Screen prior to discharge -If Respiratory Status is not improving will obtain a Pulmonary Evaluation but in the interim will need to get a CT of the Chest w/o Contrast -CT of the chest done and showed "Upper lobe predominant interlobular septal thickening and bilateral perihilar ground-glass airspace disease, consistent with mild edema. Trace left pleural effusion.  Splenomegaly. 4. Marked coronary artery atherosclerosis. Aortic Atherosclerosis." -Check Echocardiogram and give a dose of IV Lasix today. -Pulmonary was consulted and feels that his bilateral interstitial opacities are consistent with leukemic infiltrates and they are obtaining a Procalcitonin and was 0.81  -They feel that the treatment of this is treating the underlying malignancy -They expect his oxygen requirements increased and suspect that he may be hospice appropriate and is too unstable for a bronchoscopy to exclude atypical infection -Now that AML has been diagnosed and Confirmed his Prognosis is very poor   Thrombocytopenia -Most likely a result of  his AML.   -Counts noted to be even lower today at 16 again.   -No bleeding noted.   -Recommendation is for transfusion if it drops below 10,000 or if he has bleeding episodes.  Coagulopathy -INR was noted to be elevated at 1.5.   -PTT is normal.  Could be due to nutritional deficiencies.   -No history of liver disease. Vit K was ordered. -Continue to Monitor and repeat in the AM   Acute on chronic kidney disease stage IIIa Metabolic Acidosis  -Baseline renal function appears to be around 1.2.  Came in with creatinine of 1.5-1.6.  Renal function worsened likely due to dehydration and has now subsequently improved and BUN/creatinine is now 28/1.30 yesterday and today it is 25/1.49. (GFR is now 71) -He does have a small Acidosis with a CO2 of 19, AG of 9, and Chloride Level of 110 -He was given IV fluids with improvement in creatinine today.  Monitor urine output.  Avoid nephrotoxic agents.    Constipation -C/w Bisacodyl 10 mg RC Daily Prn, Miralax 17 grams po BID, and Senna-Docusate 2 tab po BID -Will need a Sodium Phosphate Enema DC Daily PRN Severe Constipation  Hypernatremia -Came in with sodium of 151 and was hydrated.   -Sodium level has improved and has normalized and is 138  Insulin-dependent diabetes mellitus type 2 -HbA1c 6.3 and repeat 5.9.  Patient remains on Lantus and SSI.   -CBGs are reasonably well controlled and ranging from 153- -Continue to Monitor Blood Sugars carefully   History of Coronary Artery Disease -Stable.  -Noted to be on statin.  Prolonged QT interval -QTC 531 ms.   -Continue to Monitor and Replete Electrolytes.   -Magnesium was 2.6 on last check.  Potassium is now 4.1 -Repeat EKG In the AM   Hyperbilirubinemia -Patient's T Bili has been elevated and is 1.3 yesterday and today is 0.9 -Continue to Monitor and Trend -Repeat CMP in the AM  DVT prophylaxis: SCDs given Thromobocytopenia and Anemia  Code Status: DO NOT RESUSCITATE Family  Communication: Called Dr. Rebekah Chesterfield his Neighbor  Disposition Plan: Pending further clinical improvement and evaluation by PT/OT; PT/OT recommending SNF and patient acceptable to go but his Prognosis is extremely poor and feel like he will not make it out of this Hospitalization given his worsened O2 Requirement   Status is: Inpatient  Remains inpatient appropriate because:Unsafe d/c plan, IV treatments appropriate due to intensity of illness or inability to take PO and Inpatient level of care appropriate due to severity of illness   Dispo:  Patient From: Home  Planned Disposition: [facility]  Medically stable for discharge: No    Consultants:   Medical Oncology; OF NOTE Tried to get in touch with Dr. Irene Limbo with no response   Interventional Radiology   Procedures: CT BM ASP AND CORE  06/12/20  Antimicrobials:  Anti-infectives (From admission, onward)   Start     Dose/Rate Route Frequency Ordered Stop   06/12/20 1700  doxycycline (VIBRAMYCIN) 100 mg in sodium chloride 0.9 % 250 mL IVPB        100 mg 125 mL/hr over 120 Minutes Intravenous Every 12 hours 06/12/20 1500     06/12/20 1600  cefTRIAXone (ROCEPHIN) 1 g in sodium chloride 0.9 % 100 mL IVPB        1 g 200 mL/hr over 30 Minutes Intravenous Every 24 hours 06/12/20 1500          Subjective: Seen and examined at bedside and was extremely dyspneic and extremely short of breath.  States any movement causes him to be winded.  Denies any current pain.  No lightheadedness or dizziness.  No other concerns or complaints at this time.  Objective: Vitals:   06/13/20 1432 06/13/20 1450 06/13/20 1518 06/13/20 1740  BP:  (!) 113/58 (!) 101/56 102/62  Pulse:  (!) 121 (!) 122 (!) 56  Resp:  20 (!) 30 (!) 30  Temp:  99.8 F (37.7 C) (!) 100.6 F (38.1 C) 99.8 F (37.7 C)  TempSrc:  Oral Oral Oral  SpO2: (!) 89% 91% 92% (!) 88%  Weight:      Height:        Intake/Output Summary (Last 24 hours) at 06/13/2020 1906 Last  data filed at 06/13/2020 1735 Gross per 24 hour  Intake 1418.75 ml  Output 925 ml  Net 493.75 ml   Filed Weights   06/09/20 0133 06/10/20 0500 06/11/20 0546  Weight: 77.3 kg 77.6 kg 78.9 kg   Examination: Physical Exam:  Constitutional: Thin elderly chronically ill-appearing Caucasian male in moderate respiratory distress and appears uncomfortable Eyes: Lids and conjunctivae normal, sclerae anicteric  ENMT: External Ears, Nose appear normal. Grossly normal hearing.  Neck: Appears normal, supple, no cervical masses, normal ROM, no appreciable thyromegaly; no JVD Respiratory: Diminished to auscultation bilaterally with coarse breath sounds, no wheezing, rales, rhonchi or crackles. Has  significantly increased respiratory effort Tachypenic. No accessory muscle use. Wearing Supplemental O2 and   Cardiovascular: Tachycardiac rate but regular rhythm. No extremity edema. 2+ pedal pulses. No carotid bruits.  Abdomen: Soft, non-tender, non-distended. Bowel sounds positive.  GU: Deferred. Musculoskeletal: No clubbing / cyanosis of digits/nails. No joint deformity upper and lower extremities.  Skin: No rashes, lesions, ulcers on a limited skin evaluation. No induration; Warm and dry.  Neurologic: CN 2-12 grossly intact with no focal deficits. Romberg sign and cerebellar reflexes not assessed.  Psychiatric: Normal judgment and insight. Alert and oriented x 3. Anxious mood and appropriate affect.   Data Reviewed: I have personally reviewed following labs and imaging studies  CBC: Recent Labs  Lab 06/13/2020 1824 06/09/20 0539 06/10/20 0848 06/11/20 0807 06/12/20 0523 06/13/20 0517  WBC 10.8* 13.0* 13.4* 15.8* 20.3* 25.5*  NEUTROABS 5.8  --   --  8.1* 8.1* 13.3*  HGB 7.4* 7.3* 8.6* 8.3* 8.0* 7.3*  HCT 26.1* 25.6* 28.2* 27.0* 26.2* 24.0*  MCV 102.0* 102.4* 98.9 97.8 97.8 98.0  PLT 31* 35* 25* 16* 16* 16*   Basic Metabolic Panel: Recent Labs  Lab 06/09/20 0539 06/10/20 0457 06/11/20 0550  06/12/20 0523 06/13/20 0517  NA 145 139 140 138 138  K 3.8 4.4 4.5 4.1 3.9  CL 110 109 111 110 110  CO2 24 23 20* 19* 18*  GLUCOSE 242* 174* 182* 174* 179*  BUN 34* 36* 28* 28* 25*  CREATININE 1.69* 1.80* 1.50* 1.30* 1.49*  CALCIUM 7.9* 8.0* 8.5* 8.4* 8.0*  MG 2.6*  --   --   --  2.2  PHOS  --   --   --   --  2.1*   GFR: Estimated Creatinine Clearance: 44.1 mL/min (A) (by C-G formula based on SCr of 1.49 mg/dL (H)). Liver Function Tests: Recent Labs  Lab 06/09/20 0539 06/10/20 0457 06/11/20 0550 06/12/20 0523 06/13/20 0517  AST 20 23 26 21 20   ALT 20 21 27 26 23   ALKPHOS 88 96 126 151* 144*  BILITOT 1.4* 1.3* 1.3* 1.3* 0.9  PROT 5.8* 5.4* 5.8* 5.6* 5.6*  ALBUMIN 3.3* 3.0* 3.1* 2.9* 2.8*   No results for input(s): LIPASE, AMYLASE in the last 168 hours. No results for input(s): AMMONIA in the last 168 hours. Coagulation Profile: Recent Labs  Lab 06/20/2020 1824  INR 1.5*   Cardiac Enzymes: Recent Labs  Lab 06/09/20 0539  CKTOTAL 52   BNP (last 3 results) No results for input(s): PROBNP in the last 8760 hours. HbA1C: No results for input(s): HGBA1C in the last 72 hours. CBG: Recent Labs  Lab 06/12/20 1927 06/13/20 0723 06/13/20 0857 06/13/20 1103 06/13/20 1616  GLUCAP 185* 153* 160* 277* 203*   Lipid Profile: No results for input(s): CHOL, HDL, LDLCALC, TRIG, CHOLHDL, LDLDIRECT in the last 72 hours. Thyroid Function Tests: No results for input(s): TSH, T4TOTAL, FREET4, T3FREE, THYROIDAB in the last 72 hours. Anemia Panel: No results for input(s): VITAMINB12, FOLATE, FERRITIN, TIBC, IRON, RETICCTPCT in the last 72 hours. Sepsis Labs: Recent Labs  Lab 06/13/20 0517  PROCALCITON 0.81    Recent Results (from the past 240 hour(s))  Resp Panel by RT-PCR (Flu A&B, Covid) Nasopharyngeal Swab     Status: None   Collection Time: 06/12/2020 10:27 PM   Specimen: Nasopharyngeal Swab; Nasopharyngeal(NP) swabs in vial transport medium  Result Value Ref Range  Status   SARS Coronavirus 2 by RT PCR NEGATIVE NEGATIVE Final    Comment: (NOTE) SARS-CoV-2 target nucleic acids  are NOT DETECTED.  The SARS-CoV-2 RNA is generally detectable in upper respiratory specimens during the acute phase of infection. The lowest concentration of SARS-CoV-2 viral copies this assay can detect is 138 copies/mL. A negative result does not preclude SARS-Cov-2 infection and should not be used as the sole basis for treatment or other patient management decisions. A negative result may occur with  improper specimen collection/handling, submission of specimen other than nasopharyngeal swab, presence of viral mutation(s) within the areas targeted by this assay, and inadequate number of viral copies(<138 copies/mL). A negative result must be combined with clinical observations, patient history, and epidemiological information. The expected result is Negative.  Fact Sheet for Patients:  EntrepreneurPulse.com.au  Fact Sheet for Healthcare Providers:  IncredibleEmployment.be  This test is no t yet approved or cleared by the [place] FDA and  has been authorized for detection and/or diagnosis of SARS-CoV-2 by FDA under an Emergency Use Authorization (EUA). This EUA will remain  in effect (meaning this test can be used) for the duration of the COVID-19 declaration under Section 564(b)(1) of the Act, 21 U.S.C.section 360bbb-3(b)(1), unless the authorization is terminated  or revoked sooner.       Influenza A by PCR NEGATIVE NEGATIVE Final   Influenza B by PCR NEGATIVE NEGATIVE Final    Comment: (NOTE) The Xpert Xpress SARS-CoV-2/FLU/RSV plus assay is intended as an aid in the diagnosis of influenza from Nasopharyngeal swab specimens and should not be used as a sole basis for treatment. Nasal washings and aspirates are unacceptable for Xpert Xpress SARS-CoV-2/FLU/RSV testing.  Fact Sheet for  Patients: EntrepreneurPulse.com.au  Fact Sheet for Healthcare Providers: IncredibleEmployment.be  This test is not yet approved or cleared by the [place] FDA and has been authorized for detection and/or diagnosis of SARS-CoV-2 by FDA under an Emergency Use Authorization (EUA). This EUA will remain in effect (meaning this test can be used) for the duration of the COVID-19 declaration under Section 564(b)(1) of the Act, 21 U.S.C. section 360bbb-3(b)(1), unless the authorization is terminated or revoked.  Performed at [facility], [place] [address] [place], [place] [address]      RN Pressure Injury Documentation:     Estimated body mass index is 21.74 kg/m as calculated from the following:   Height as of this encounter: 6' 3"  (1.905 m).   Weight as of this encounter: 78.9 kg.  Malnutrition Type:  Nutrition Problem: Increased nutrient needs Etiology: acute illness   Malnutrition Characteristics:  Signs/Symptoms: estimated needs   Nutrition Interventions:  Interventions: Ensure Enlive (each supplement provides 350kcal and 20 grams of protein),Prostat   Radiology Studies: CT CHEST WO CONTRAST  Result Date: 06/12/2020 CLINICAL DATA:  Weakness, respiratory failure, anemic, dyspnea on exertion EXAM: CT CHEST WITHOUT CONTRAST TECHNIQUE: Multidetector CT imaging of the chest was performed following the standard protocol without IV contrast. COMPARISON:  06/12/2020 FINDINGS: Cardiovascular: Unenhanced imaging of the heart and great vessels demonstrates no significant pericardial effusion. The heart is not enlarged. 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 Evaluation of the aortic lumen is limited without IV contrast. There is mild atherosclerosis of the aortic arch, with extensive atherosclerosis throughout the coronary vasculature. Mediastinum/Nodes: No enlarged mediastinal or  axillary lymph nodes. Thyroid gland, trachea, and esophagus demonstrate no significant findings. Lungs/Pleura: Upper lobe predominant centrilobular emphysema. Bilateral perihilar ground-glass airspace disease with upper lobe predominant interlobular septal thickening suggests mild pulmonary edema. Trace left pleural effusion. No pneumothorax. The central airways are  patent. Upper Abdomen: Spleen is markedly enlarged. No other acute upper abdominal findings. Musculoskeletal: No acute or destructive bony lesions. Reconstructed images demonstrate no additional findings. IMPRESSION: 1. Upper lobe predominant interlobular septal thickening and bilateral perihilar ground-glass airspace disease, consistent with mild edema. 2. Trace left pleural effusion. 3. Splenomegaly. 4. Marked coronary artery atherosclerosis. 5. Aortic Atherosclerosis (ICD10-I70.0) and Emphysema (ICD10-J43.9). Electronically Signed   By: Randa Ngo M.D.   On: 06/12/2020 20:00   CT BIOPSY  Result Date: 06/12/2020 INDICATION: AML EXAM: CT GUIDED RIGHT ILIAC BONE MARROW ASPIRATION AND CORE BIOPSY Date:  06/12/2020 06/12/2020 10:17 am Radiologist:  M. Daryll Brod, MD Guidance:  CT FLUOROSCOPY TIME:  Fluoroscopy Time: NONE. MEDICATIONS: 1% lidocaine local ANESTHESIA/SEDATION: 2.0 mg IV Versed; 50 mcg IV Fentanyl Moderate Sedation Time:  10 minutes The patient was continuously monitored during the procedure by the interventional radiology nurse under my direct supervision. CONTRAST:  None. COMPLICATIONS: None PROCEDURE: Informed consent was obtained from the patient following explanation of the procedure, risks, benefits and alternatives. The patient understands, agrees and consents for the procedure. All questions were addressed. A time out was performed. The patient was positioned prone and non-contrast localization CT was performed of the pelvis to demonstrate the iliac marrow spaces. Maximal barrier sterile technique utilized including caps, mask,  sterile gowns, sterile gloves, large sterile drape, hand hygiene, and Betadine prep. Under sterile conditions and local anesthesia, an 11 gauge coaxial bone biopsy needle was advanced into the right iliac marrow space. Needle position was confirmed with CT imaging. Initially, bone marrow aspiration was performed. Next, the 11 gauge outer cannula was utilized to obtain a right iliac bone marrow core biopsy. Needle was removed. Hemostasis was obtained with compression. The patient tolerated the procedure well. Samples were prepared with the cytotechnologist. No immediate complications. IMPRESSION: CT guided right iliac bone marrow aspiration and core biopsy. Electronically Signed   By: Jerilynn Mages.  Shick M.D.   On: 06/12/2020 11:45   DG CHEST PORT 1 VIEW  Result Date: 06/13/2020 CLINICAL DATA:  Shortness of breath. EXAM: PORTABLE CHEST 1 VIEW COMPARISON:  CT 06/12/2020.  Chest x-ray 06/12/2020. FINDINGS: Mediastinum and hilar structures are unremarkable. Heart size normal. Bilateral pulmonary interstitial infiltrates/edema again noted. Interstitial infiltrates have progressed on the left. No pleural effusion or pneumothorax. Mild scoliosis thoracic spine. Subchondral cyst noted over the left humerus, most likely degenerative. IMPRESSION: Bilateral pulmonary interstitial infiltrates/edema again noted. Interstitial infiltrates/edema have progressed on the left. Electronically Signed   By: Marcello Moores  Register   On: 06/13/2020 05:51   DG CHEST PORT 1 VIEW  Result Date: 06/12/2020 CLINICAL DATA:  Dyspnea. EXAM: PORTABLE CHEST 1 VIEW COMPARISON:  June 08, 2020. FINDINGS: The heart size and mediastinal contours are within normal limits. No pneumothorax or pleural effusion is noted. Mildly increased bilateral lung opacities are noted, right greater than left, concerning for multifocal pneumonia. The visualized skeletal structures are unremarkable. IMPRESSION: Mildly increased bilateral lung opacities are noted, right greater  than left, concerning for multifocal pneumonia. Electronically Signed   By: Marijo Conception M.D.   On: 06/12/2020 11:13   CT BONE MARROW BIOPSY & ASPIRATION  Result Date: 06/12/2020 INDICATION: AML EXAM: CT GUIDED RIGHT ILIAC BONE MARROW ASPIRATION AND CORE BIOPSY Date:  06/12/2020 06/12/2020 10:17 am Radiologist:  M. Daryll Brod, MD Guidance:  CT FLUOROSCOPY TIME:  Fluoroscopy Time: NONE. MEDICATIONS: 1% lidocaine local ANESTHESIA/SEDATION: 2.0 mg IV Versed; 50 mcg IV Fentanyl Moderate Sedation Time:  10 minutes The patient was continuously monitored  during the procedure by the interventional radiology nurse under my direct supervision. CONTRAST:  None. COMPLICATIONS: None PROCEDURE: Informed consent was obtained from the patient following explanation of the procedure, risks, benefits and alternatives. The patient understands, agrees and consents for the procedure. All questions were addressed. A time out was performed. The patient was positioned prone and non-contrast localization CT was performed of the pelvis to demonstrate the iliac marrow spaces. Maximal barrier sterile technique utilized including caps, mask, sterile gowns, sterile gloves, large sterile drape, hand hygiene, and Betadine prep. Under sterile conditions and local anesthesia, an 11 gauge coaxial bone biopsy needle was advanced into the right iliac marrow space. Needle position was confirmed with CT imaging. Initially, bone marrow aspiration was performed. Next, the 11 gauge outer cannula was utilized to obtain a right iliac bone marrow core biopsy. Needle was removed. Hemostasis was obtained with compression. The patient tolerated the procedure well. Samples were prepared with the cytotechnologist. No immediate complications. IMPRESSION: CT guided right iliac bone marrow aspiration and core biopsy. Electronically Signed   By: Jerilynn Mages.  Shick M.D.   On: 06/12/2020 11:45   ECHOCARDIOGRAM COMPLETE  Result Date: 06/13/2020    ECHOCARDIOGRAM REPORT    Patient Name:   Steve Maldonado Date of Exam: 06/13/2020 Medical Rec #:  [id number]     Height:       75.0 in Accession #:    [id number]    Weight:       173.9 lb Date of Birth:  Sep 28, 1940     BSA:          2.068 m Patient Age:    44 years      BP:           107/60 mmHg Patient Gender: M             HR:           109 bpm. Exam Location:  Inpatient Procedure: 2D Echo, Color Doppler and Cardiac Doppler Indications:    Dyspnea R06.00  History:        Patient has prior history of Echocardiogram examinations, most                 recent 02/20/2018. Stroke, Signs/Symptoms:Dyspnea; Risk                 Factors:Hypertension, Diabetes and Dyslipidemia.  Sonographer:    Bernadene Person RDCS Referring Phys: [id number] Georgina Quint LATIF [place]  1. Left ventricular ejection fraction, by estimation, is 50 to 55%. The left ventricle has low normal function. The left ventricle demonstrates regional wall motion abnormalities (see scoring diagram/findings for description). There is mild concentric left ventricular hypertrophy. Left ventricular diastolic parameters were normal.  2. Right ventricular systolic function is mildly reduced. The right ventricular size is mildly enlarged.  3. The mitral valve is normal in structure. Mild mitral valve regurgitation. No evidence of mitral stenosis.  4. The aortic valve is normal in structure. Aortic valve regurgitation is not visualized. Mild to moderate aortic valve sclerosis/calcification is present, without any evidence of aortic stenosis.  5. The inferior vena cava is normal in size with greater than 50% respiratory variability, suggesting right atrial pressure of 3 mmHg. FINDINGS  Left Ventricle: Left ventricular ejection fraction, by estimation, is 50 to 55%. The left ventricle has low normal function. The left ventricle demonstrates regional wall motion abnormalities. The left ventricular internal cavity size was normal in size. There is mild concentric left ventricular hypertrophy. Left  ventricular diastolic  parameters were normal.  LV Wall Scoring: The basal inferolateral segment is hypokinetic. Right Ventricle: The right ventricular size is mildly enlarged. No increase in right ventricular wall thickness. Right ventricular systolic function is mildly reduced. Left Atrium: Left atrial size was normal in size. Right Atrium: Right atrial size was normal in size. Pericardium: There is no evidence of pericardial effusion. Mitral Valve: The mitral valve is normal in structure. Mild mitral valve regurgitation. No evidence of mitral valve stenosis. Tricuspid Valve: The tricuspid valve is normal in structure. Tricuspid valve regurgitation is not demonstrated. No evidence of tricuspid stenosis. Aortic Valve: The aortic valve is normal in structure. Aortic valve regurgitation is not visualized. Mild to moderate aortic valve sclerosis/calcification is present, without any evidence of aortic stenosis. Pulmonic Valve: The pulmonic valve was normal in structure. Pulmonic valve regurgitation is not visualized. No evidence of pulmonic stenosis. Aorta: The aortic root is normal in size and structure. Venous: The inferior vena cava is normal in size with greater than 50% respiratory variability, suggesting right atrial pressure of 3 mmHg. IAS/Shunts: No atrial level shunt detected by color flow Doppler.  LEFT VENTRICLE PLAX 2D LVIDd:         4.40 cm LVIDs:         2.70 cm LV PW:         1.00 cm LV IVS:        1.00 cm LVOT diam:     2.00 cm LV SV:         62 LV SV Index:   30 LVOT Area:     3.14 cm  RIGHT VENTRICLE TAPSE (M-mode): 1.8 cm LEFT ATRIUM             Index       RIGHT ATRIUM           Index LA diam:        2.50 cm 1.21 cm/m  RA Area:     14.00 cm LA Vol (A2C):   28.2 ml 13.64 ml/m RA Volume:   36.80 ml  17.80 ml/m LA Vol (A4C):   28.2 ml 13.64 ml/m LA Biplane Vol: 31.2 ml 15.09 ml/m  AORTIC VALVE LVOT Vmax:   110.00 cm/s LVOT Vmean:  67.300 cm/s LVOT VTI:    0.196 m  AORTA Ao Root diam: 3.40 cm Ao  Asc diam:  3.60 cm  SHUNTS Systemic VTI:  0.20 m Systemic Diam: 2.00 cm Ena Dawley MD Electronically signed by Ena Dawley MD Signature Date/Time: 06/13/2020/11:08:59 AM    Final      Scheduled Meds:  (feeding supplement) PROSource Plus  30 mL Oral Daily   sodium chloride   Intravenous Once   feeding supplement  237 mL Oral Q24H   guaiFENesin  1,200 mg Oral BID   insulin aspart  0-5 Units Subcutaneous QHS   insulin aspart  0-9 Units Subcutaneous TID WC   insulin glargine  10 Units Subcutaneous Daily   ipratropium  0.5 mg Nebulization Q6H WA   levalbuterol  0.63 mg Nebulization Q6H WA   melatonin  3 mg Oral QHS   polyethylene glycol  17 g Oral BID   rosuvastatin  20 mg Oral QPM   senna-docusate  2 tablet Oral BID   Continuous Infusions:  sodium chloride 10 mL/hr at 06/12/2020 2345   cefTRIAXone (ROCEPHIN)  IV 1 g (06/13/20 1738)   doxycycline (VIBRAMYCIN) IV 100 mg (06/13/20 1828)    LOS: 3 days   Kerney Elbe, DO [facility] PAGER is on AMION  If 7PM-7AM, please contact night-coverage [email or web address]

## 2020-06-13 NOTE — Consult Note (Signed)
.   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 Diff on CBC showed >20% blasts. S/p BMBx on 3/23 w/ concern for evolving AML. PCCM. A CT chest obtained on 2/23 showed diffuse bilateral GG changes w/ on-going O2 needs of 6 liters   Pertinent  Medical History  CAD, HTN, CVA, DM type II, CRI, MDS.  Stopped smoking 42 yrs ago Significant Hospital Events: Including procedures, antibiotic start and stop dates in addition to other pertinent events   16/72: 80 year old male who was admitted : weakness. Initial ER eval CXR clear. hgb 7.4 PLT 31K. Admitted for further eval Heme consulted.  3/20: transfused 2 [facility] 3/21 pathology reporting >20% blasts in diff raising possibility of acute leukemia seen by heme. ddx MDS/MPN. W/ concern for evolving AML 3/23 BM bx CT guided completed. CXR ordered to eval on-going exertional dyspnea. obtained showed diffuse airspace disease. Started on doxy and ceftriaxone. PLTs down to 8k.CT chest obtained showed diffuse GG changes. PCCM asked to eval   Interim History / Subjective:  Resting shortness of breath   Objective   Blood pressure 107/60, pulse (Abnormal) 114, temperature 98.4 F (36.9 C), temperature source Oral, resp. rate (Abnormal) 30, height 6\' 3"  (1.905 m), weight 78.9 kg, SpO2 90 %.        Intake/Output Summary (Last 24 hours) at 06/13/2020 1332 Last data filed at 06/13/2020 1100 Gross per 24 hour  Intake 1320.01 ml  Output 1025 ml  Net 295.01 ml   Filed Weights   06/09/20 0133 06/10/20 0500 06/11/20 0546  Weight: 77.3 kg 77.6 kg 78.9 kg    Examination: General: 80 year old white male resting in bed. He is very short of breath. Only able to speak in 2-3 word phrases.  HENT: MM dry. Neck  veins flat  Lungs: crackles dry and in both bases. RR high 20s some accessory muscle use and marked Inc WOB w/ just talking  Cardiovascular: RRR  Abdomen: soft not tender  Extremities: warm and dry  Neuro: awake oriented  GU: voids   Labs/imaging that I havepersonally reviewed   The CT chest showed diffuse bilateral GG changes. 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  Leukocytosis  Constipation  CKD stage III Acute on chronic renal failure NAG metabolic acidosis  Weight loss  Fluid and electrolyte imbalance: hypophosphatemia   Pulm problem list   Acute hypoxic respiratory failure in the setting of diffuse bilateral GG pulmonary infiltrates. Ddx: pulm edema (seems unlikely->nml BNP nml EF), infection (not a clinically compelling story for this), TRALI (got 2 units of blood ->but his symptoms predated this and finally could this be  leukemic pulmonary infiltrates (seems more likely).  -his WOB is to significant for FOB Plan/rec Cont supplemental oxygen abx reasonable  Doubt lasix will help much he's already dry Unfortunately nothing else to offer if his malignancy can't be treated other than comfort measures which he indicates he is open to     [employer] (evaluated daily)  Per primary   Labs  CBC: Recent Labs  Lab 06/19/2020 1824 06/09/20 0539 06/10/20 0848 06/11/20 0807 06/12/20 0523 06/13/20 0517  WBC 10.8* 13.0* 13.4* 15.8* 20.3* 25.5*  NEUTROABS 5.8  --   --  8.1* 8.1* 13.3*  HGB 7.4* 7.3* 8.6* 8.3* 8.0* 7.3*  HCT 26.1* 25.6* 28.2* 27.0* 26.2* 24.0*  MCV 102.0* 102.4* 98.9 97.8 97.8 98.0  PLT 31* 35* 25* 16* 16* 16*    Basic Metabolic Panel: Recent Labs  Lab 06/09/20 0539 06/10/20 0457 06/11/20 0550 06/12/20 0523 06/13/20 0517  NA 145  139 140 138 138  K 3.8 4.4 4.5 4.1 3.9  CL 110 109 111 110 110  CO2 24 23 20* 19* 18*  GLUCOSE 242* 174* 182* 174* 179*  BUN 34* 36* 28* 28* 25*  CREATININE 1.69* 1.80* 1.50* 1.30* 1.49*  CALCIUM 7.9* 8.0* 8.5* 8.4* 8.0*  MG 2.6*  --   --   --  2.2  PHOS  --   --   --   --  2.1*   GFR: Estimated Creatinine Clearance: 44.1 mL/min (A) (by C-G formula based on SCr of 1.49 mg/dL (H)). Recent Labs  Lab 06/10/20 0848 06/11/20 0807 06/12/20 0523 06/13/20 0517  WBC 13.4* 15.8* 20.3* 25.5*    Liver Function Tests: Recent Labs  Lab 06/09/20 0539 06/10/20 0457 06/11/20 0550 06/12/20 0523 06/13/20 0517  AST 20 23 26 21 20   ALT 20 21 27 26 23   ALKPHOS 88 96 126 151* 144*  BILITOT 1.4* 1.3* 1.3* 1.3* 0.9  PROT 5.8* 5.4* 5.8* 5.6* 5.6*  ALBUMIN 3.3* 3.0* 3.1* 2.9* 2.8*   No results for input(s): LIPASE, AMYLASE in the last 168 hours. No results for input(s): AMMONIA in the last 168 hours.  ABG No results found for: PHART, PCO2ART, PO2ART, HCO3, TCO2, ACIDBASEDEF, O2SAT   Coagulation Profile: Recent Labs  Lab 05/29/2020 1824  INR 1.5*    Cardiac Enzymes: Recent Labs  Lab 06/09/20 0539  CKTOTAL 52    HbA1C: Hgb A1c MFr Bld  Date/Time Value Ref Range Status  06/10/2020 08:48 AM 5.9 (H) 4.8 - 5.6 % Final    Comment:    (NOTE)         Prediabetes: 5.7 - 6.4         Diabetes: >6.4         Glycemic control for adults with diabetes: <7.0   06/09/2020 05:39 AM 6.3 (H) 4.8 - 5.6 % Final    Comment:    (NOTE)         Prediabetes: 5.7 - 6.4         Diabetes: >6.4         Glycemic control for adults with diabetes: <7.0     CBG: Recent Labs  Lab 06/12/20 1625 06/12/20 1927 06/13/20 0723 06/13/20 0857 06/13/20 1103  GLUCAP 221* 185* 153* 160* 277*    Review of Systems:   Review of Systems  Constitutional: Positive for malaise/fatigue and weight loss. Negative for chills and fever.  HENT: Positive for hearing loss.   Eyes: Negative.   Respiratory: Positive  for cough, sputum production, shortness of breath and wheezing.   Cardiovascular: Negative.   Gastrointestinal: Positive for heartburn.  Genitourinary: Negative.   Musculoskeletal: Negative.   Skin: Negative.   Neurological: Positive for weakness.  Endo/Heme/Allergies: Negative.   Psychiatric/Behavioral: Negative.      Past Medical History:  He,  has a past medical history of Diabetes mellitus without complication ([place]), Hyperlipidemia, Hypertension, and Stroke ([place]) (1970).  Surgical History:   Past Surgical History:  Procedure Laterality Date  . APPENDECTOMY  1980  . INTRAVASCULAR ULTRASOUND/IVUS N/A 12/10/2016   Procedure: Intravascular Ultrasound/IVUS;  Surgeon: Nigel Mormon, MD;  Location: [facility];  Service: Cardiovascular;  Laterality: N/A;  . LEFT HEART CATH AND CORONARY ANGIOGRAPHY N/A 12/10/2016   Procedure: LEFT HEART CATH AND CORONARY ANGIOGRAPHY;  Surgeon: Nigel Mormon, MD;  Location: [facility];  Service: Cardiovascular;  Laterality: N/A;     Social History:   reports that he quit smoking about 42 years ago. His smoking use included cigarettes. He has a 20.00 pack-year smoking history. He has never used smokeless tobacco. He reports previous alcohol use. He reports that he does not use drugs.   Family History:  His family history is negative for Colon cancer, Esophageal cancer, Rectal cancer, and Stomach cancer.   Allergies No Known Allergies   Home Medications  Prior to Admission medications   Medication Sig Start Date End Date Taking? Authorizing Provider  alfuzosin (UROXATRAL) 10 MG 24 hr tablet Take 10 mg by mouth every evening. 05/09/20  Yes [provider]  empagliflozin (JARDIANCE) 25 MG TABS tablet Take 25 mg by mouth every evening.   Yes [provider]  insulin degludec (TRESIBA FLEXTOUCH) 100 UNIT/ML FlexTouch Pen Inject 20 Units into the skin every morning.   Yes [provider]  metoprolol  succinate (TOPROL-XL) 50 MG 24 hr tablet TAKE 1 TABLET BY MOUTH DAILY Patient taking differently: Take 50 mg by mouth every evening. 11/16/19  Yes Patwardhan, Manish J, MD  nitroGLYCERIN (NITROSTAT) 0.4 MG SL tablet Place 0.4 mg under the tongue every 5 (five) minutes as needed for chest pain. 01/04/20  Yes [provider]  rosuvastatin (CRESTOR) 20 MG tablet TAKE 1 TABLET BY MOUTH DAILY Patient taking differently: Take 20 mg by mouth every evening. 02/20/20  Yes Patwardhan, Manish J, MD  SitaGLIPtin-MetFORMIN HCl [phone] MG TB24 Take 1 tablet by mouth every evening.   Yes [provider]     Critical care time: NA      Erick Colace ACNP-BC [place] Pager # [phone] OR # [phone] if no answer

## 2020-06-13 NOTE — Plan of Care (Signed)

## 2020-06-13 NOTE — Plan of Care (Signed)

## 2020-06-13 NOTE — Progress Notes (Addendum)
HEMATOLOGY-ONCOLOGY PROGRESS NOTE  SUBJECTIVE:   Steve Maldonado was seen in follow-up for his pancytopenia.  Constipation has now resolved. Notes that he still feels weak and somewhat short of breath.  His WBC is up to 12.5 today and hemoglobin is down to 7.3.  Platelets are overall stable at 16,000 this morning.  No bleeding reported.  REVIEW OF SYSTEMS:  .10 Point review of Systems was done is negative except as noted above.  I have reviewed the past medical history, past surgical history, social history and family history with the patient and they are unchanged from previous note.   PHYSICAL EXAMINATION: ECOG PERFORMANCE STATUS: 2 - Symptomatic, <50% confined to bed  Vitals:   06/13/20 0725 06/13/20 0854  BP: 107/60   Pulse: (!) 114   Resp: (!) 30   Temp:    SpO2: 92% 90%   Filed Weights   06/09/20 0133 06/10/20 0500 06/11/20 0546  Weight: 77.3 kg 77.6 kg 78.9 kg    Intake/Output from previous day: 03/23 0701 - 03/24 0700 In: 885.3 [P.O.:480; I.V.:200; IV Piggyback:205.3] Out: 1175 [Urine:1175]  GENERAL: Awake and alert, appears fatigued SKIN: skin color, texture, turgor are normal, no rashes or significant lesions EYES: Conjunctival pallor LYMPH:  no palpable lymphadenopathy in the cervical, axillary or inguinal LUNGS: clear to auscultation and percussion with normal breathing effort HEART: regular rate & rhythm and no murmurs and no lower extremity edema ABDOMEN:abdomen soft, non-tender and normal bowel sounds, no HSM NEURO: alert & oriented x 3 with fluent speech, no focal motor/sensory deficits  LABORATORY DATA:  I have reviewed the data as listed CMP Latest Ref Rng & Units 06/13/2020 06/12/2020 06/11/2020  Glucose 70 - 99 mg/dL 179(H) 174(H) 182(H)  BUN 8 - 23 mg/dL 25(H) 28(H) 28(H)  Creatinine 0.61 - 1.24 mg/dL 1.49(H) 1.30(H) 1.50(H)  Sodium 135 - 145 mmol/L 138 138 140  Potassium 3.5 - 5.1 mmol/L 3.9 4.1 4.5  Chloride 98 - 111 mmol/L 110 110 111  CO2 22 - 32  mmol/L 18(L) 19(L) 20(L)  Calcium 8.9 - 10.3 mg/dL 8.0(L) 8.4(L) 8.5(L)  Total Protein 6.5 - 8.1 g/dL 5.6(L) 5.6(L) 5.8(L)  Total Bilirubin 0.3 - 1.2 mg/dL 0.9 1.3(H) 1.3(H)  Alkaline Phos 38 - 126 U/L 144(H) 151(H) 126  AST 15 - 41 U/L 20 21 26   ALT 0 - 44 U/L 23 26 27     Lab Results  Component Value Date   WBC 25.5 (H) 06/13/2020   HGB 7.3 (L) 06/13/2020   HCT 24.0 (L) 06/13/2020   MCV 98.0 06/13/2020   PLT 16 (LL) 06/13/2020   NEUTROABS 13.3 (H) 06/13/2020    DG Chest 2 View  Result Date: 06/03/2020 CLINICAL DATA:  Shortness of breath. EXAM: CHEST - 2 VIEW COMPARISON:  02/17/2018 FINDINGS: Both lungs are clear. Heart and mediastinum are within normal limits. No large pleural effusions. Negative for pneumothorax. Trachea is midline. IMPRESSION: No active cardiopulmonary disease. Electronically Signed   By: Markus Daft M.D.   On: 06/03/2020 11:44   CT HEAD WO CONTRAST  Result Date: 06/10/2020 CLINICAL DATA:  80 year old male with neurologic deficit. EXAM: CT HEAD WITHOUT CONTRAST TECHNIQUE: Contiguous axial images were obtained from the base of the skull through the vertex without intravenous contrast. COMPARISON:  None. FINDINGS: Brain: Mild age-related atrophy and moderate chronic microvascular ischemic changes. Left periventricular and white matter corona radiata hypodensity, chronic. There is no acute intracranial hemorrhage. No mass effect or midline shift. No extra-axial fluid collection. Vascular: No hyperdense  vessel or unexpected calcification. Skull: Normal. Negative for fracture or focal lesion. Sinuses/Orbits: No acute finding. Other: None IMPRESSION: 1. No acute intracranial pathology. 2. Age-related atrophy and chronic microvascular ischemic changes. Electronically Signed   By: Anner Crete M.D.   On: 06/19/2020 20:02   CT CHEST WO CONTRAST  Result Date: 06/12/2020 CLINICAL DATA:  Weakness, respiratory failure, anemic, dyspnea on exertion EXAM: CT CHEST WITHOUT CONTRAST  TECHNIQUE: Multidetector CT imaging of the chest was performed following the standard protocol without IV contrast. COMPARISON:  06/12/2020 FINDINGS: Cardiovascular: Unenhanced imaging of the heart and great vessels demonstrates no significant pericardial effusion. The heart is not enlarged. 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 Evaluation of the aortic lumen is limited without IV contrast. There is mild atherosclerosis of the aortic arch, with extensive atherosclerosis throughout the coronary vasculature. Mediastinum/Nodes: No enlarged mediastinal or axillary lymph nodes. Thyroid gland, trachea, and esophagus demonstrate no significant findings. Lungs/Pleura: Upper lobe predominant centrilobular emphysema. Bilateral perihilar ground-glass airspace disease with upper lobe predominant interlobular septal thickening suggests mild pulmonary edema. Trace left pleural effusion. No pneumothorax. The central airways are patent. Upper Abdomen: Spleen is markedly enlarged. No other acute upper abdominal findings. Musculoskeletal: No acute or destructive bony lesions. Reconstructed images demonstrate no additional findings. IMPRESSION: 1. Upper lobe predominant interlobular septal thickening and bilateral perihilar ground-glass airspace disease, consistent with mild edema. 2. Trace left pleural effusion. 3. Splenomegaly. 4. Marked coronary artery atherosclerosis. 5. Aortic Atherosclerosis (ICD10-I70.0) and Emphysema (ICD10-J43.9). Electronically Signed   By: Randa Ngo M.D.   On: 06/12/2020 20:00   CT Abdomen Pelvis W Contrast  Result Date: 06/03/2020 CLINICAL DATA:  Abdominal pain, constipation. EXAM: CT ABDOMEN AND PELVIS WITH CONTRAST TECHNIQUE: Multidetector CT imaging of the abdomen and pelvis was performed using the standard protocol following bolus administration of intravenous contrast. CONTRAST:  11m OMNIPAQUE IOHEXOL 300 MG/ML  SOLN COMPARISON:  None. FINDINGS:  Lower chest: Minimal bibasilar subsegmental atelectasis is noted. Hepatobiliary: No focal liver abnormality is seen. No gallstones, gallbladder wall thickening, or biliary dilatation. Pancreas: Unremarkable. No pancreatic ductal dilatation or surrounding inflammatory changes. Spleen: Calcified splenic granulomata are noted. Moderate splenomegaly is noted. Adrenals/Urinary Tract: Adrenal glands and kidneys are unremarkable. No hydronephrosis or renal obstruction is noted. No renal or ureteral calculi are noted. At least 2 small bladder diverticula are noted posteriorly and superiorly. Stomach/Bowel: Stomach appears normal. There is no evidence of bowel obstruction or inflammation. Status post appendectomy. Vascular/Lymphatic: Aortic atherosclerosis. No enlarged abdominal or pelvic lymph nodes. Reproductive: Mild prostatic enlargement is noted. Other: No abdominal wall hernia or abnormality. No abdominopelvic ascites. Musculoskeletal: No acute or significant osseous findings. IMPRESSION: 1. Moderate splenomegaly. 2. At least 2 small bladder diverticula are noted posteriorly and superiorly. 3. Mild prostatic enlargement. 4. Aortic atherosclerosis. Aortic Atherosclerosis (ICD10-I70.0). Electronically Signed   By: JMarijo ConceptionM.D.   On: 06/03/2020 14:32   CT BIOPSY  Result Date: 06/12/2020 INDICATION: AML EXAM: CT GUIDED RIGHT ILIAC BONE MARROW ASPIRATION AND CORE BIOPSY Date:  06/12/2020 06/12/2020 10:17 am Radiologist:  M. TDaryll Brod MD Guidance:  CT FLUOROSCOPY TIME:  Fluoroscopy Time: NONE. MEDICATIONS: 1% lidocaine local ANESTHESIA/SEDATION: 2.0 mg IV Versed; 50 mcg IV Fentanyl Moderate Sedation Time:  10 minutes The patient was continuously monitored during the procedure by the interventional radiology nurse under my direct supervision. CONTRAST:  None. COMPLICATIONS: None PROCEDURE: Informed consent was obtained from the patient following explanation of the procedure, risks, benefits and alternatives.  The  patient understands, agrees and consents for the procedure. All questions were addressed. A time out was performed. The patient was positioned prone and non-contrast localization CT was performed of the pelvis to demonstrate the iliac marrow spaces. Maximal barrier sterile technique utilized including caps, mask, sterile gowns, sterile gloves, large sterile drape, hand hygiene, and Betadine prep. Under sterile conditions and local anesthesia, an 11 gauge coaxial bone biopsy needle was advanced into the right iliac marrow space. Needle position was confirmed with CT imaging. Initially, bone marrow aspiration was performed. Next, the 11 gauge outer cannula was utilized to obtain a right iliac bone marrow core biopsy. Needle was removed. Hemostasis was obtained with compression. The patient tolerated the procedure well. Samples were prepared with the cytotechnologist. No immediate complications. IMPRESSION: CT guided right iliac bone marrow aspiration and core biopsy. Electronically Signed   By: Jerilynn Mages.  Shick M.D.   On: 06/12/2020 11:45   DG CHEST PORT 1 VIEW  Result Date: 06/13/2020 CLINICAL DATA:  Shortness of breath. EXAM: PORTABLE CHEST 1 VIEW COMPARISON:  CT 06/12/2020.  Chest x-ray 06/12/2020. FINDINGS: Mediastinum and hilar structures are unremarkable. Heart size normal. Bilateral pulmonary interstitial infiltrates/edema again noted. Interstitial infiltrates have progressed on the left. No pleural effusion or pneumothorax. Mild scoliosis thoracic spine. Subchondral cyst noted over the left humerus, most likely degenerative. IMPRESSION: Bilateral pulmonary interstitial infiltrates/edema again noted. Interstitial infiltrates/edema have progressed on the left. Electronically Signed   By: Marcello Moores  Register   On: 06/13/2020 05:51   DG CHEST PORT 1 VIEW  Result Date: 06/12/2020 CLINICAL DATA:  Dyspnea. EXAM: PORTABLE CHEST 1 VIEW COMPARISON:  June 08, 2020. FINDINGS: The heart size and mediastinal contours are  within normal limits. No pneumothorax or pleural effusion is noted. Mildly increased bilateral lung opacities are noted, right greater than left, concerning for multifocal pneumonia. The visualized skeletal structures are unremarkable. IMPRESSION: Mildly increased bilateral lung opacities are noted, right greater than left, concerning for multifocal pneumonia. Electronically Signed   By: Marijo Conception M.D.   On: 06/12/2020 11:13   DG Chest Portable 1 View  Result Date: 05/27/2020 CLINICAL DATA:  80 year old male with shortness of breath. EXAM: PORTABLE CHEST 1 VIEW COMPARISON:  Chest radiograph dated 06/03/2020. FINDINGS: No focal consolidation, pleural effusion, or pneumothorax. The cardiac silhouette is within limits. No acute osseous pathology. IMPRESSION: No active disease. Electronically Signed   By: Anner Crete M.D.   On: 05/30/2020 21:56   CT BONE MARROW BIOPSY & ASPIRATION  Result Date: 06/12/2020 INDICATION: AML EXAM: CT GUIDED RIGHT ILIAC BONE MARROW ASPIRATION AND CORE BIOPSY Date:  06/12/2020 06/12/2020 10:17 am Radiologist:  M. Daryll Brod, MD Guidance:  CT FLUOROSCOPY TIME:  Fluoroscopy Time: NONE. MEDICATIONS: 1% lidocaine local ANESTHESIA/SEDATION: 2.0 mg IV Versed; 50 mcg IV Fentanyl Moderate Sedation Time:  10 minutes The patient was continuously monitored during the procedure by the interventional radiology nurse under my direct supervision. CONTRAST:  None. COMPLICATIONS: None PROCEDURE: Informed consent was obtained from the patient following explanation of the procedure, risks, benefits and alternatives. The patient understands, agrees and consents for the procedure. All questions were addressed. A time out was performed. The patient was positioned prone and non-contrast localization CT was performed of the pelvis to demonstrate the iliac marrow spaces. Maximal barrier sterile technique utilized including caps, mask, sterile gowns, sterile gloves, large sterile drape, hand  hygiene, and Betadine prep. Under sterile conditions and local anesthesia, an 11 gauge coaxial bone biopsy needle was advanced into the  right iliac marrow space. Needle position was confirmed with CT imaging. Initially, bone marrow aspiration was performed. Next, the 11 gauge outer cannula was utilized to obtain a right iliac bone marrow core biopsy. Needle was removed. Hemostasis was obtained with compression. The patient tolerated the procedure well. Samples were prepared with the cytotechnologist. No immediate complications. IMPRESSION: CT guided right iliac bone marrow aspiration and core biopsy. Electronically Signed   By: Jerilynn Mages.  Shick M.D.   On: 06/12/2020 11:45    ASSESSMENT AND PLAN: 80 y.o. 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  02/23/18 BM Bx which revealed hypercellular bone marrow with dyspoietic changes 02/23/18 BM Flow Cytometry did not reveal a significant CD34 positive blastic population  #2  Thrombocytopenia, with significant worsening  #3 constipation/ileus PLAN:  -Discussed lab work today 06/13/2020-hemoglobin is down to 7.3 today.  He continues to be symptomatic.  Agree with transfusing 1 unit PRBCs today. -Platelets stable at 16,000, he is not actively bleeding -Transfuse PRBC for hemoglobin less than 8 or if symptomatic. -Transfuse platelets prophylactically for counts less than 10k or if bleeding. -Patient has had a bone marrow biopsy 3/23 and we should hopefully have some results in the next 1 or 2 days. -Continue goals of care discussion -We will follow up with the bone marrow biopsy results. -Appreciate excellent hospital medicine care.  Steve Maldonado 06/13/20  ADDENDUM  .Patient was Personally  and independently interviewed, examined and relevant elements of the history of present illness were reviewed in details and an assessment and plan was created. All elements of the patient's history of present illness , assessment and plan were discussed in details with Steve Bussing DNP. The above documentation reflects our combined findings assessment and plan.   Surgical Pathology  CASE: [id number]  PATIENT: Steve Maldonado  Flow Pathology Report   DIAGNOSIS:   -Significant myeloblastic population identified (30%).  -See comment   COMMENT:   Flow cytometric analysis shows a significant myeloblastic population  representing 30% of all cells in the sample. This is admixed with  abundant monocytic cells (37%) with no significant phenotypic  abnormalities. 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 Given his functional status and age he is not a good candidate for induction chemotherapy and not a candidate for transplant. He is not inclined to pursue vidaza +/- venetoclax and the pursue the recurrent transfusions and burden of medical cares associated with this. He notes he is keen to pursue best supportive cares through hospice. At his request I called his close friend and one of his HCPOA Dr Shary Decamp and discussed his diagnosis and clinical situation. Dr Rebekah Chesterfield agrees with patients decision to pursue best supportive cares. He notes that he would like patient to continue to receive supportive transfusions untilearly next week so he has a chance to meet with and say good bye to his sister who shall be visiting him in the next 1-2 days and other family members. Patient and DrLeone request that all family members be allowed  to  visit with him. I shall place a comfort care order so that more family is able to meet with him. PLAN -continue supportive cares and transfusions for now -comfort cares order placed -based on how he is doing early next week would need to consider transition to [facility] place for continued hospice cares. -he has no immediate family that can serve as primary caregiver to enable home hospice.  Steve Maldonado

## 2020-06-13 NOTE — Progress Notes (Addendum)
Physical Therapy Treatment Patient Details Name: Steve Maldonado MRN: [id number] DOB: Jun 23, 1940 Today's Date: 06/13/2020    History of Present Illness 80 y.o. male with medical history significant for CAD, hypertension, history of CVA, type 2 diabetes mellitus, mild renal insufficiency, and MDS, presented to the emergency department 3/19/22for evaluation of weakness, inability to walk. Noted to be profoundly anemic.  Received 2 units. Patient with dyspnea on exertion. Was in ED  06/03/20 for SOB, constipation. weakness. S/p bone biopsy 3/23    PT Comments    Pt fatigues easily, severe SOB with bed mobility, requires 2-3 minutes of rest between rolling supine to R sidelying and back to L sidelying. Pt cued for hand placement and LE placement to assist in rolling. Pt on 6L O2 desats to 70% and recovers to 88% within ~2 minutes; RN in room to assess pt, increased pt to 7L O2 and pt 95% while supine at EOS. Session overall limited, pt unable to tolerate transfers or exercises due to significant SOB and desat with bed mobility. Will continue to progress acute PT as able.  Follow Up Recommendations  SNF     Equipment Recommendations  Rolling walker with 5" wheels    Recommendations for Other Services       Precautions / Restrictions Precautions Precautions: Fall Precaution Comments: monitor O2 and HR Restrictions Weight Bearing Restrictions: No    Mobility  Bed Mobility Overal bed mobility: Needs Assistance Bed Mobility: Rolling Rolling: Min assist    General bed mobility comments: cues for hand placement and LE positioning into bent position to assist in rolling supine to R and L sidelying, nurse tech in room changing linen, pt severely fatigued and SOB requiring 2-3 minutes of rest before rolling opposite direction    Transfers  General transfer comment: too fatigued with bed mobility  Ambulation/Gait    Stairs    Wheelchair Mobility    Modified Rankin (Stroke Patients  Only)       Balance     Cognition Arousal/Alertness: Awake/alert Behavior During Therapy: WFL for tasks assessed/performed Overall Cognitive Status: Within Functional Limits for tasks assessed   General Comments: follows commands appropriately, minimal conversation due to SOB with mobility      Exercises      General Comments General comments (skin integrity, edema, etc.): Pt on 6L O2 upon arrival, desats to 70% with bed mobility, verbal cues for pursed lip breathing and rest breaks to recover to 88% in ~2 minutes; pt with difficulty maintaining 90% on 6L O2, RN in room increased pt to 7L, pt at 95% while supine at EOS; HR 120-130s with bed mobility, down to 115 while supine at rest.      Pertinent Vitals/Pain Pain Assessment: Faces Faces Pain Scale: Hurts little more Pain Location: Generalized discomfort Pain Descriptors / Indicators: Grimacing Pain Intervention(s): Limited activity within patient's tolerance;Monitored during session;Repositioned    Home Living                      Prior Function            PT Goals (current goals can now be found in the care plan section) Acute Rehab PT Goals Patient Stated Goal: to get stronger PT Goal Formulation: With patient Time For Goal Achievement: 06/24/20 Potential to Achieve Goals: Fair Progress towards PT goals: Not progressing toward goals - comment (limited)    Frequency    Min 2X/week      PT Plan Current plan remains appropriate  Co-evaluation              AM-PAC PT "6 Clicks" Mobility   Outcome Measure  Help needed turning from your back to your side while in a flat bed without using bedrails?: A Little Help needed moving from lying on your back to sitting on the side of a flat bed without using bedrails?: A Little Help needed moving to and from a bed to a chair (including a wheelchair)?: A Lot Help needed standing up from a chair using your arms (e.g., wheelchair or bedside chair)?: A  Lot Help needed to walk in hospital room?: Total Help needed climbing 3-5 steps with a railing? : Total 6 Click Score: 12    End of Session Equipment Utilized During Treatment: Oxygen Activity Tolerance: Patient limited by fatigue Patient left: in bed;with call bell/phone within reach;with bed alarm set Nurse Communication: Mobility status;Other (comment) (nurse tech in room changing linen/gown, RN in room due to O2 decrease/increased O2 to 7L, HR) PT Visit Diagnosis: Unsteadiness on feet (R26.81);Difficulty in walking, not elsewhere classified (R26.2)     Time: 7253-6644 PT Time Calculation (min) (ACUTE ONLY): 22 min  Charges:  $Therapeutic Activity: 8-22 mins                      Tori Acelin Ferdig PT, DPT 06/13/20, 1:55 PM

## 2020-06-13 NOTE — Progress Notes (Signed)
  Echocardiogram 2D Echocardiogram has been performed.  Steve Maldonado 06/13/2020, 9:04 AM

## 2020-06-14 DIAGNOSIS — Z515 Encounter for palliative care: Secondary | ICD-10-CM | POA: Diagnosis not present

## 2020-06-14 DIAGNOSIS — R0602 Shortness of breath: Secondary | ICD-10-CM

## 2020-06-14 DIAGNOSIS — D649 Anemia, unspecified: Secondary | ICD-10-CM | POA: Diagnosis not present

## 2020-06-14 DIAGNOSIS — C9200 Acute myeloblastic leukemia, not having achieved remission: Secondary | ICD-10-CM | POA: Diagnosis not present

## 2020-06-14 DIAGNOSIS — D689 Coagulation defect, unspecified: Secondary | ICD-10-CM | POA: Diagnosis not present

## 2020-06-14 DIAGNOSIS — C9202 Acute myeloblastic leukemia, in relapse: Secondary | ICD-10-CM | POA: Diagnosis not present

## 2020-06-14 DIAGNOSIS — N1831 Chronic kidney disease, stage 3a: Secondary | ICD-10-CM | POA: Diagnosis not present

## 2020-06-14 LAB — CBC
HCT: 24.7 % — ABNORMAL LOW (ref 39.0–52.0)
Hemoglobin: 7.6 g/dL — ABNORMAL LOW (ref 13.0–17.0)
MCH: 29.6 pg (ref 26.0–34.0)
MCHC: 30.8 g/dL (ref 30.0–36.0)
MCV: 96.1 fL (ref 80.0–100.0)
Platelets: 18 10*3/uL — CL (ref 150–400)
RBC: 2.57 MIL/uL — ABNORMAL LOW (ref 4.22–5.81)
RDW: 18.3 % — ABNORMAL HIGH (ref 11.5–15.5)
WBC: 40.1 10*3/uL — ABNORMAL HIGH (ref 4.0–10.5)
nRBC: 2.4 % — ABNORMAL HIGH (ref 0.0–0.2)

## 2020-06-14 LAB — BPAM RBC
Blood Product Expiration Date: 202204112359
ISSUE DATE / TIME: 202203241456
Unit Type and Rh: 5100

## 2020-06-14 LAB — GLUCOSE, CAPILLARY
Glucose-Capillary: 279 mg/dL — ABNORMAL HIGH (ref 70–99)
Glucose-Capillary: 249 mg/dL — ABNORMAL HIGH (ref 70–99)

## 2020-06-14 LAB — COMPREHENSIVE METABOLIC PANEL
ALT: 19 U/L (ref 0–44)
AST: 20 U/L (ref 15–41)
Albumin: 2.7 g/dL — ABNORMAL LOW (ref 3.5–5.0)
Alkaline Phosphatase: 144 U/L — ABNORMAL HIGH (ref 38–126)
Anion gap: 11 (ref 5–15)
BUN: 39 mg/dL — ABNORMAL HIGH (ref 8–23)
CO2: 18 mmol/L — ABNORMAL LOW (ref 22–32)
Calcium: 7.9 mg/dL — ABNORMAL LOW (ref 8.9–10.3)
Chloride: 106 mmol/L (ref 98–111)
Creatinine, Ser: 1.62 mg/dL — ABNORMAL HIGH (ref 0.61–1.24)
GFR, Estimated: 43 mL/min — ABNORMAL LOW (ref >60)
Glucose, Bld: 346 mg/dL — ABNORMAL HIGH (ref 70–99)
Potassium: 3.9 mmol/L (ref 3.5–5.1)
Sodium: 135 mmol/L (ref 135–145)
Total Bilirubin: 1.2 mg/dL (ref 0.3–1.2)
Total Protein: 5.3 g/dL — ABNORMAL LOW (ref 6.5–8.1)

## 2020-06-14 LAB — TYPE AND SCREEN
ABO/RH(D): O POS
Antibody Screen: NEGATIVE
Unit division: 0

## 2020-06-14 LAB — PROCALCITONIN: Procalcitonin: 2.69 ng/mL

## 2020-06-14 LAB — SURGICAL PATHOLOGY

## 2020-06-14 MED ORDER — MORPHINE SULFATE (PF) 2 MG/ML IV SOLN
2.0000 mg | INTRAVENOUS | Status: DC | PRN
  Administered 2020-06-14 – 2020-06-15 (×4): 2 mg via INTRAVENOUS
  Filled 2020-06-14 (×4): qty 1

## 2020-06-14 MED ORDER — LORAZEPAM 1 MG PO TABS: 1.0000 mg | ORAL_TABLET | ORAL | Status: DC | PRN

## 2020-06-14 MED ORDER — OXYCODONE HCL 20 MG/ML PO CONC: 5.0000 mg | ORAL | Status: DC | PRN

## 2020-06-14 MED ORDER — MORPHINE SULFATE (PF) 2 MG/ML IV SOLN
1.0000 mg | INTRAVENOUS | Status: DC | PRN
  Administered 2020-06-14: 1 mg via INTRAVENOUS
  Filled 2020-06-14: qty 1

## 2020-06-14 MED ORDER — LORAZEPAM 2 MG/ML IJ SOLN
1.0000 mg | INTRAMUSCULAR | Status: DC | PRN
  Administered 2020-06-14: 1 mg via INTRAVENOUS

## 2020-06-14 MED ORDER — MORPHINE 100MG IN NS 100ML (1MG/ML) PREMIX INFUSION
1.0000 mg/h | INTRAVENOUS | Status: DC
  Administered 2020-06-14: 1 mg/h via INTRAVENOUS
  Filled 2020-06-14: qty 100

## 2020-06-14 MED ORDER — BIOTENE DRY MOUTH MT LIQD: 15.0000 mL | OROMUCOSAL | Status: DC | PRN

## 2020-06-14 MED ORDER — GLYCOPYRROLATE 0.2 MG/ML IJ SOLN
0.2000 mg | INTRAMUSCULAR | Status: DC | PRN
  Filled 2020-06-14: qty 1

## 2020-06-14 MED ORDER — LORAZEPAM 2 MG/ML IJ SOLN
1.0000 mg | INTRAMUSCULAR | Status: DC | PRN
  Administered 2020-06-14 – 2020-06-15 (×2): 1 mg via INTRAVENOUS
  Filled 2020-06-14 (×3): qty 1

## 2020-06-14 MED ORDER — GLYCOPYRROLATE 1 MG PO TABS
1.0000 mg | ORAL_TABLET | ORAL | Status: DC | PRN
  Filled 2020-06-14: qty 1

## 2020-06-14 MED ORDER — LORAZEPAM 2 MG/ML PO CONC: 1.0000 mg | ORAL | Status: DC | PRN

## 2020-06-14 MED ORDER — POLYVINYL ALCOHOL 1.4 % OP SOLN: 1.0000 [drp] | Freq: Four times a day (QID) | OPHTHALMIC | Status: DC | PRN

## 2020-06-14 NOTE — Progress Notes (Signed)
Notes from oncology reviewed.  We will sign off  Erick Colace ACNP-BC [place] Pager # [phone] OR # [phone] if no answer

## 2020-06-14 NOTE — Care Management Important Message (Signed)
Important Message  Patient Details  IM Letter placed in Patient's room. Name: Steve Maldonado MRN: [id number] Date of Birth: Jul 09, 1940   Medicare Important Message Given:  Yes     Kerin Salen 06/14/2020, 11:41 AM

## 2020-06-14 NOTE — Progress Notes (Signed)
PROGRESS NOTE    Steve Maldonado  TDS:[id number] DOB: 26-Sep-1940 DOA: 06/12/2020 PCP: Burnard Bunting, MD   Brief Narrative:  The patient is an 80 year old Caucasian male with a past medical history significant for but not limited to CAD, hypertension, history of CVA, type 2 diabetes mellitus, mild renal insufficiency and MDS who presented to the emergency department for the evaluation of weakness.  He is noted to be profoundly anemic and patient was dyspneic on exertion.  Of note he had a smear that showed greater than 20% blasts and likely has acute leukemia.  Dr. Claiborne Billings has been following and plan is for bone marrow biopsy today.  He continues to feel fatigued and continues mention difficulty breathing with minimal exertion with talking.  Chest x-ray today reveals a multifocal pneumonia so he has been started on CAP coverage with IV doxycycline and IV ceftriaxone.  Patient underwent a bone marrow biopsy 06/12/20 and would like the results of the bone marrow biopsy prior to making a final decision of whether or not to transition to comfort care.  Still has not had appropriate bowel movements and laxatives have been increased.  His respiratory status is worsening so we called pulmonary for further evaluation. His Bx report came back with AML and Oncology discussed with the patient and family and the focus of his care transitioned to comfort and now he is on FULL Comfort measures given progressively worsening SOB. His prognosis is very grim and he is extremely sick and actively dying and is not stable for even potential [place] placement. Anticipate In-Hospital Death and it is imminently pending within hours to days. All medications not in the patient's comfort to be discontinued.   Assessment & Plan:   Principal Problem:   Anemia Active Problems:   SOB (shortness of breath)   Diabetes mellitus type 2 in nonobese (HCC)   Thrombocytopenia (HCC)   History of CVA in adulthood   Coronary artery  disease of native artery of native heart with stable angina pectoris (HCC)   Chronic kidney disease, stage 3a (HCC)   Prolonged QT interval   Hypernatremia   Physical debility   Coagulopathy (HCC)   Symptomatic anemia   AML (acute myeloblastic leukemia) (HCC)   Pancytopenia ([place])   Counseling regarding advance care planning and goals of care   Terminal care  Acute Myeloid Leukemia/History of Myelodysplastic Syndrome -Patient has been previously followed by Dr. Irene Limbo for his MDS.   -His counts were stable as of October 2021.  He did not require any blood transfusions.   -Came in with generalized weakness and fatigue and was noted to be significantly more anemic and thrombocytopenic than baseline.  He was transfused 2 units of blood for symptomatic anemia. -Peripheral smear does suggest more than 20% blasts raising concern for transformation to acute myeloid leukemia. Dx confirms Acute Myeloid Leukemia -Dr. Irene Limbo was subsequently consulted and following  -Current Plan is for a bone marrow biopsy and that is today 06/12/20.   -Dr. Irene Limbo has discussed goals of care with patient.  Further Care per Heme/onc -Continue to monitor for now.  -Leukocytosis was likely due to AML and has gone from 15.8 -> 20.3 -> 25.5 -> 40.1 -Per Oncology: "We would have to consider possible hypomethylating agent plus or minus venetoclax and significant burden of supportive transfusions OR best supportive cares through hospice" -Further decisions to be made pending Bx Results and apparently Dr. Irene Limbo has spoken with Family about Dx and a decision has been made  to transition to COMFORT -Will transition to [place] per Heme/Onc and Anticipated [facility] Death is expected given his significant decompensation and worsening.  -Now on SL Oxycodone and IV Morphine  Symptomatic anemia with profound generalized weakness/fatigue -Patient symptoms were initially thought to be due to his significant anemia.   -He was  transfused 2 units of PRBC.  Has not noticed much improvement in his symptoms.   -See above discussion as well.  No overt bleeding noted.   -Anemia panel did not show any clear-cut deficiencies and showed an iron level of 55, U IBC 152, TIBC of 207, saturation ratios of 27%, ferritin level 309, folate level of 6.3, and vitamin B12 level of 1840 -Hemoglobin noted to be stable this morning. -Patient's hemoglobin/hematocrit is now gone from 8.3/27.0 -> 8.0/26.2 -> 7.3/24.8 -> 7.6/24.7 -Continues to be quite weak and fatigued.   -Unsafe to go back home since he does live by himself.   -Anticipate In Hospital Death   Multifocal Pneumonia, poA -Initial CXR showed "No focal consolidation, pleural effusion, or pneumothorax. The cardiac silhouette is within limits. No acute osseous pathology." -Repeat CXR showed "Mildly increased bilateral lung opacities are noted, right greater than left, concerning for multifocal pneumonia." -Start Abx Coverage as above -C/w Xopenex/Atrovent q6h, Guaifenesin 1200 mg po BID, Flutter Valve and Incentive Spirometry  -SpO2: 93 % O2 Flow Rate (L/min): 15 L/min -Continue to monitor respiratory status very carefully and maintain saturations greater than 90% -Continue supplemental oxygen via nasal cannula and wean O2 as tolerated -Check BNP and ECHOCardiogram to r/o Heart Failure -Strict I's and O's and Daily Weights; Patient is - 3.361 Liters  -WBC trended up to 20.3 -Repeat chest x-ray and will need amatory home O2 Screen prior to discharge -If Respiratory Status is not improving will obtain a Pulmonary Evaluation but in the interim will need to get a CT of the Chest w/o Contrast -CT of the chest done and showed "Upper lobe predominant interlobular septal thickening and bilateral perihilar ground-glass airspace disease, consistent with mild edema. Trace left pleural effusion.  Splenomegaly. 4. Marked coronary artery atherosclerosis. Aortic Atherosclerosis." -ABX stopping  now; ECHO Was Normal -Pulmonary was consulted and feels that his bilateral interstitial opacities are consistent with leukemic infiltrates and they are obtaining a Procalcitonin and was 0.81 -> 2.69; Now Pulmonary Signed off given Transition to Comfort Measures  -They feel that the treatment of this is treating the underlying malignancy -They expect his oxygen requirements increased and suspect that he may be hospice appropriate and is too unstable for a bronchoscopy to exclude atypical infection -Now that AML has been diagnosed and Confirmed his Prognosis is very poor and He has been transitioned to [place] and Imminent Death is expected   Thrombocytopenia -Most likely a result of his AML.   -Platelet Count is 18  -No bleeding noted.   -Will stop checking given Transition to Comfort  Coagulopathy -INR was noted to be elevated at 1.5.   -PTT is normal.  Could be due to nutritional deficiencies.   -No history of liver disease. Vit K was ordered. -Continue to Monitor and repeat in the AM   Acute on chronic kidney disease stage IIIa Metabolic Acidosis  -Baseline renal function appears to be around 1.2.  Came in with creatinine of 1.5-1.6.  Renal function worsened likely due to dehydration and had subsequently improved but now again worsening BUN/creatinine is now 28/1.30 -> 25/1.49 -> 39/1.62. (GFR is now 33) -He does have a small  Acidosis with a CO2 of 19, AG of 9, and Chloride Level of 110 -He was given IV fluids with improvement in creatinine today.  Monitor urine output.  Avoid nephrotoxic agents.    Constipation -C/w Bisacodyl 10 mg RC Daily Prn, Miralax 17 grams po BID, and Senna-Docusate 2 tab po BID -Will need a Sodium Phosphate Enema DC Daily PRN Severe Constipation  Hypernatremia -Came in with sodium of 151 and was hydrated.   -Sodium level has improved and has normalized and is 135 -Will not check as he is Comfort Care  Insulin-dependent diabetes mellitus type  2 -HbA1c 6.3 and repeat 5.9.  Patient remains on Lantus and SSI.   -CBGs are reasonably well controlled and ranging from 153-249 -Will not Continue to Monitor Blood Sugars now that he is Full Comfort   History of Coronary Artery Disease -Stable.  -Noted to be on statin.  Prolonged QT interval -QTC 531 ms.   -Continue to Monitor and Replete Electrolytes.   -Magnesium was 2.6 on last check.  Potassium is now 4.1 -Repeat EKG In the AM   Hyperbilirubinemia -Patient's T Bili had  -Will not Continue to Monitor and Trend  DVT prophylaxis: SCDs given Thromobocytopenia and Anemia  Code Status: DO NOT RESUSCITATE Family Communication: No family present at bedside  Disposition Plan: Pending further clinical improvement and evaluation by PT/OT; PT/OT recommending SNF and patient acceptable to go but his Prognosis is extremely poor and feel like he will not make it out of this Hospitalization given his worsened O2 Requirement   Status is: Inpatient  Remains inpatient appropriate because:Unsafe d/c plan, IV treatments appropriate due to intensity of illness or inability to take PO and Inpatient level of care appropriate due to severity of illness   Dispo:  Patient From: Home  Planned Disposition: In Hosptial Death   Medically stable for discharge: No    Consultants:   Medical Oncology; OF NOTE Tried to get in touch with Dr. Irene Limbo with no response   Interventional Radiology   Procedures: CT BM ASP AND CORE  06/12/20  Antimicrobials:  Anti-infectives (From admission, onward)   Start     Dose/Rate Route Frequency Ordered Stop   06/12/20 1700  doxycycline (VIBRAMYCIN) 100 mg in sodium chloride 0.9 % 250 mL IVPB        100 mg 125 mL/hr over 120 Minutes Intravenous Every 12 hours 06/12/20 1500     06/12/20 1600  cefTRIAXone (ROCEPHIN) 1 g in sodium chloride 0.9 % 100 mL IVPB        1 g 200 mL/hr over 30 Minutes Intravenous Every 24 hours 06/12/20 1500          Subjective: Seen  and examined at bedside and his respiratory status was worse off and he was struggling on 15 L nonrebreather.  States he remains dyspneic even with the slightest movement.  Denies any current pain but felt short of breath.  No nausea or vomiting.  Understands that he is dying and has elected to go full comfort.  No other concerns or complaints at this time and no family at bedside  Objective: Vitals:   06/13/20 1951 06/13/20 2132 06/14/20 0500 06/14/20 0822  BP:  (!) 110/57 108/60   Pulse:  (!) 128 (!) 113   Resp:  (!) 26 (!) 32   Temp:  98.5 F (36.9 C) 97.6 F (36.4 C)   TempSrc:      SpO2: 90% 95% 95% 93%  Weight:  Height:        Intake/Output Summary (Last 24 hours) at 06/14/2020 1425 Last data filed at 06/14/2020 0900 Gross per 24 hour  Intake 474.31 ml  Output 1175 ml  Net -700.69 ml   Filed Weights   06/09/20 0133 06/10/20 0500 06/11/20 0546  Weight: 77.3 kg 77.6 kg 78.9 kg   Examination: Physical Exam:  Constitutional: The patient is a thin elderly chronically ill-appearing Caucasian male in moderate respiratory distress and appears very uncomfortable to morning on nonrebreather Eyes: Lids and conjunctivae normal, sclerae anicteric  ENMT: External Ears, Nose appear normal. Grossly normal hearing. Neck: Appears normal, supple, no cervical masses, normal ROM, no appreciable thyromegaly; no appreciable JVD Respiratory: Diminished to auscultation bilaterally with coarse breath sounds and has some rhonchi.  Has a significant increased respiratory effort and is tachypneic on a nonrebreather using accessory muscles. Cardiovascular: Tachycardic rate but regular rhythm, no murmurs / rubs / gallops. S1 and S2 auscultated. No extremity edema. Abdomen: Soft, non-tender, non-distended. Bowel sounds positive.  GU: Deferred. Musculoskeletal: No clubbing / cyanosis of digits/nails. No joint deformity upper and lower extremities.  Skin: No rashes, lesions, ulcers on limited skin  evaluation. No induration; Warm and dry.  Neurologic: CN 2-12 grossly intact with no focal deficits. Romberg sign cerebellar reflexes not assessed.  Psychiatric: Normal judgment and insight. Alert and oriented x 3.  Anxious mood and appropriate affect.   Data Reviewed: I have personally reviewed following labs and imaging studies  CBC: Recent Labs  Lab 05/27/2020 1824 06/09/20 0539 06/10/20 0848 06/11/20 0807 06/12/20 0523 06/13/20 0517 06/14/20 0508  WBC 10.8*   < > 13.4* 15.8* 20.3* 25.5* 40.1*  NEUTROABS 5.8  --   --  8.1* 8.1* 13.3*  --   HGB 7.4*   < > 8.6* 8.3* 8.0* 7.3* 7.6*  HCT 26.1*   < > 28.2* 27.0* 26.2* 24.0* 24.7*  MCV 102.0*   < > 98.9 97.8 97.8 98.0 96.1  PLT 31*   < > 25* 16* 16* 16* 18*   < > = values in this interval not displayed.   Basic Metabolic Panel: Recent Labs  Lab 06/09/20 0539 06/10/20 0457 06/11/20 0550 06/12/20 0523 06/13/20 0517 06/14/20 0508  NA 145 139 140 138 138 135  K 3.8 4.4 4.5 4.1 3.9 3.9  CL 110 109 111 110 110 106  CO2 24 23 20* 19* 18* 18*  GLUCOSE 242* 174* 182* 174* 179* 346*  BUN 34* 36* 28* 28* 25* 39*  CREATININE 1.69* 1.80* 1.50* 1.30* 1.49* 1.62*  CALCIUM 7.9* 8.0* 8.5* 8.4* 8.0* 7.9*  MG 2.6*  --   --   --  2.2  --   PHOS  --   --   --   --  2.1*  --    GFR: Estimated Creatinine Clearance: 40.6 mL/min (A) (by C-G formula based on SCr of 1.62 mg/dL (H)). Liver Function Tests: Recent Labs  Lab 06/10/20 0457 06/11/20 0550 06/12/20 0523 06/13/20 0517 06/14/20 0508  AST 23 26 21 20 20   ALT 21 27 26 23 19   ALKPHOS 96 126 151* 144* 144*  BILITOT 1.3* 1.3* 1.3* 0.9 1.2  PROT 5.4* 5.8* 5.6* 5.6* 5.3*  ALBUMIN 3.0* 3.1* 2.9* 2.8* 2.7*   No results for input(s): LIPASE, AMYLASE in the last 168 hours. No results for input(s): AMMONIA in the last 168 hours. Coagulation Profile: Recent Labs  Lab 05/23/2020 1824  INR 1.5*   Cardiac Enzymes: Recent Labs  Lab 06/09/20 0539  CKTOTAL 52   BNP (last 3 results) No  results for input(s): PROBNP in the last 8760 hours. HbA1C: No results for input(s): HGBA1C in the last 72 hours. CBG: Recent Labs  Lab 06/13/20 1103 06/13/20 1616 06/13/20 2127 06/14/20 0729 06/14/20 1135  GLUCAP 277* 203* 189* 279* 249*   Lipid Profile: No results for input(s): CHOL, HDL, LDLCALC, TRIG, CHOLHDL, LDLDIRECT in the last 72 hours. Thyroid Function Tests: No results for input(s): TSH, T4TOTAL, FREET4, T3FREE, THYROIDAB in the last 72 hours. Anemia Panel: No results for input(s): VITAMINB12, FOLATE, FERRITIN, TIBC, IRON, RETICCTPCT in the last 72 hours. Sepsis Labs: Recent Labs  Lab 06/13/20 0517 06/14/20 0508  PROCALCITON 0.81 2.69    Recent Results (from the past 240 hour(s))  Resp Panel by RT-PCR (Flu A&B, Covid) Nasopharyngeal Swab     Status: None   Collection Time: 06/04/2020 10:27 PM   Specimen: Nasopharyngeal Swab; Nasopharyngeal(NP) swabs in vial transport medium  Result Value Ref Range Status   SARS Coronavirus 2 by RT PCR NEGATIVE NEGATIVE Final    Comment: (NOTE) SARS-CoV-2 target nucleic acids are NOT DETECTED.  The SARS-CoV-2 RNA is generally detectable in upper respiratory specimens during the acute phase of infection. The lowest concentration of SARS-CoV-2 viral copies this assay can detect is 138 copies/mL. A negative result does not preclude SARS-Cov-2 infection and should not be used as the sole basis for treatment or other patient management decisions. A negative result may occur with  improper specimen collection/handling, submission of specimen other than nasopharyngeal swab, presence of viral mutation(s) within the areas targeted by this assay, and inadequate number of viral copies(<138 copies/mL). A negative result must be combined with clinical observations, patient history, and epidemiological information. The expected result is Negative.  Fact Sheet for Patients:  EntrepreneurPulse.com.au  Fact Sheet for  Healthcare Providers:  IncredibleEmployment.be  This test is no t yet approved or cleared by the [place] FDA and  has been authorized for detection and/or diagnosis of SARS-CoV-2 by FDA under an Emergency Use Authorization (EUA). This EUA will remain  in effect (meaning this test can be used) for the duration of the COVID-19 declaration under Section 564(b)(1) of the Act, 21 U.S.C.section 360bbb-3(b)(1), unless the authorization is terminated  or revoked sooner.       Influenza A by PCR NEGATIVE NEGATIVE Final   Influenza B by PCR NEGATIVE NEGATIVE Final    Comment: (NOTE) The Xpert Xpress SARS-CoV-2/FLU/RSV plus assay is intended as an aid in the diagnosis of influenza from Nasopharyngeal swab specimens and should not be used as a sole basis for treatment. Nasal washings and aspirates are unacceptable for Xpert Xpress SARS-CoV-2/FLU/RSV testing.  Fact Sheet for Patients: EntrepreneurPulse.com.au  Fact Sheet for Healthcare Providers: IncredibleEmployment.be  This test is not yet approved or cleared by the [place] FDA and has been authorized for detection and/or diagnosis of SARS-CoV-2 by FDA under an Emergency Use Authorization (EUA). This EUA will remain in effect (meaning this test can be used) for the duration of the COVID-19 declaration under Section 564(b)(1) of the Act, 21 U.S.C. section 360bbb-3(b)(1), unless the authorization is terminated or revoked.  Performed at [facility], [place] [address], [place], [place] [address]      RN Pressure Injury Documentation:     Estimated body mass index is 21.74 kg/m as calculated from the following:   Height as of this encounter: 6' 3"  (1.905 m).   Weight as of this encounter: 78.9 kg.  Malnutrition  Type:  Nutrition Problem: Increased nutrient needs Etiology: acute illness   Malnutrition Characteristics:  Signs/Symptoms: estimated  needs   Nutrition Interventions:  Interventions: Ensure Enlive (each supplement provides 350kcal and 20 grams of protein),Prostat   Radiology Studies: CT CHEST WO CONTRAST  Result Date: 06/12/2020 CLINICAL DATA:  Weakness, respiratory failure, anemic, dyspnea on exertion EXAM: CT CHEST WITHOUT CONTRAST TECHNIQUE: Multidetector CT imaging of the chest was performed following the standard protocol without IV contrast. COMPARISON:  06/12/2020 FINDINGS: Cardiovascular: Unenhanced imaging of the heart and great vessels demonstrates no significant pericardial effusion. The heart is not enlarged. 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 Evaluation of the aortic lumen is limited without IV contrast. There is mild atherosclerosis of the aortic arch, with extensive atherosclerosis throughout the coronary vasculature. Mediastinum/Nodes: No enlarged mediastinal or axillary lymph nodes. Thyroid gland, trachea, and esophagus demonstrate no significant findings. Lungs/Pleura: Upper lobe predominant centrilobular emphysema. Bilateral perihilar ground-glass airspace disease with upper lobe predominant interlobular septal thickening suggests mild pulmonary edema. Trace left pleural effusion. No pneumothorax. The central airways are patent. Upper Abdomen: Spleen is markedly enlarged. No other acute upper abdominal findings. Musculoskeletal: No acute or destructive bony lesions. Reconstructed images demonstrate no additional findings. IMPRESSION: 1. Upper lobe predominant interlobular septal thickening and bilateral perihilar ground-glass airspace disease, consistent with mild edema. 2. Trace left pleural effusion. 3. Splenomegaly. 4. Marked coronary artery atherosclerosis. 5. Aortic Atherosclerosis (ICD10-I70.0) and Emphysema (ICD10-J43.9). Electronically Signed   By: Randa Ngo M.D.   On: 06/12/2020 20:00   DG CHEST PORT 1 VIEW  Result Date: 06/13/2020 CLINICAL DATA:   Shortness of breath. EXAM: PORTABLE CHEST 1 VIEW COMPARISON:  CT 06/12/2020.  Chest x-ray 06/12/2020. FINDINGS: Mediastinum and hilar structures are unremarkable. Heart size normal. Bilateral pulmonary interstitial infiltrates/edema again noted. Interstitial infiltrates have progressed on the left. No pleural effusion or pneumothorax. Mild scoliosis thoracic spine. Subchondral cyst noted over the left humerus, most likely degenerative. IMPRESSION: Bilateral pulmonary interstitial infiltrates/edema again noted. Interstitial infiltrates/edema have progressed on the left. Electronically Signed   By: Marcello Moores  Register   On: 06/13/2020 05:51   ECHOCARDIOGRAM COMPLETE  Result Date: 06/13/2020    ECHOCARDIOGRAM REPORT   Patient Name:   Steve Maldonado Date of Exam: 06/13/2020 Medical Rec #:  [id number]     Height:       75.0 in Accession #:    [id number]    Weight:       173.9 lb Date of Birth:  1940/11/30     BSA:          2.068 m Patient Age:    67 years      BP:           107/60 mmHg Patient Gender: M             HR:           109 bpm. Exam Location:  Inpatient Procedure: 2D Echo, Color Doppler and Cardiac Doppler Indications:    Dyspnea R06.00  History:        Patient has prior history of Echocardiogram examinations, most                 recent 02/20/2018. Stroke, Signs/Symptoms:Dyspnea; Risk                 Factors:Hypertension, Diabetes and Dyslipidemia.  Sonographer:    Bernadene Person RDCS Referring Phys: [id number] Georgina Quint LATIF [place]  1. Left ventricular ejection fraction, by  estimation, is 50 to 55%. The left ventricle has low normal function. The left ventricle demonstrates regional wall motion abnormalities (see scoring diagram/findings for description). There is mild concentric left ventricular hypertrophy. Left ventricular diastolic parameters were normal.  2. Right ventricular systolic function is mildly reduced. The right ventricular size is mildly enlarged.  3. The mitral valve is normal in  structure. Mild mitral valve regurgitation. No evidence of mitral stenosis.  4. The aortic valve is normal in structure. Aortic valve regurgitation is not visualized. Mild to moderate aortic valve sclerosis/calcification is present, without any evidence of aortic stenosis.  5. The inferior vena cava is normal in size with greater than 50% respiratory variability, suggesting right atrial pressure of 3 mmHg. FINDINGS  Left Ventricle: Left ventricular ejection fraction, by estimation, is 50 to 55%. The left ventricle has low normal function. The left ventricle demonstrates regional wall motion abnormalities. The left ventricular internal cavity size was normal in size. There is mild concentric left ventricular hypertrophy. Left ventricular diastolic parameters were normal.  LV Wall Scoring: The basal inferolateral segment is hypokinetic. Right Ventricle: The right ventricular size is mildly enlarged. No increase in right ventricular wall thickness. Right ventricular systolic function is mildly reduced. Left Atrium: Left atrial size was normal in size. Right Atrium: Right atrial size was normal in size. Pericardium: There is no evidence of pericardial effusion. Mitral Valve: The mitral valve is normal in structure. Mild mitral valve regurgitation. No evidence of mitral valve stenosis. Tricuspid Valve: The tricuspid valve is normal in structure. Tricuspid valve regurgitation is not demonstrated. No evidence of tricuspid stenosis. Aortic Valve: The aortic valve is normal in structure. Aortic valve regurgitation is not visualized. Mild to moderate aortic valve sclerosis/calcification is present, without any evidence of aortic stenosis. Pulmonic Valve: The pulmonic valve was normal in structure. Pulmonic valve regurgitation is not visualized. No evidence of pulmonic stenosis. Aorta: The aortic root is normal in size and structure. Venous: The inferior vena cava is normal in size with greater than 50% respiratory  variability, suggesting right atrial pressure of 3 mmHg. IAS/Shunts: No atrial level shunt detected by color flow Doppler.  LEFT VENTRICLE PLAX 2D LVIDd:         4.40 cm LVIDs:         2.70 cm LV PW:         1.00 cm LV IVS:        1.00 cm LVOT diam:     2.00 cm LV SV:         62 LV SV Index:   30 LVOT Area:     3.14 cm  RIGHT VENTRICLE TAPSE (M-mode): 1.8 cm LEFT ATRIUM             Index       RIGHT ATRIUM           Index LA diam:        2.50 cm 1.21 cm/m  RA Area:     14.00 cm LA Vol (A2C):   28.2 ml 13.64 ml/m RA Volume:   36.80 ml  17.80 ml/m LA Vol (A4C):   28.2 ml 13.64 ml/m LA Biplane Vol: 31.2 ml 15.09 ml/m  AORTIC VALVE LVOT Vmax:   110.00 cm/s LVOT Vmean:  67.300 cm/s LVOT VTI:    0.196 m  AORTA Ao Root diam: 3.40 cm Ao Asc diam:  3.60 cm  SHUNTS Systemic VTI:  0.20 m Systemic Diam: 2.00 cm Ena Dawley MD Electronically signed by Ena Dawley  MD Signature Date/Time: 06/13/2020/11:08:59 AM    Final      Scheduled Meds: . (feeding supplement) PROSource Plus  30 mL Oral Daily  . feeding supplement  237 mL Oral Q24H  . guaiFENesin  1,200 mg Oral BID  . insulin aspart  0-5 Units Subcutaneous QHS  . insulin aspart  0-9 Units Subcutaneous TID WC  . insulin glargine  10 Units Subcutaneous Daily  . ipratropium  0.5 mg Nebulization Q6H WA  . levalbuterol  0.63 mg Nebulization Q6H WA  . melatonin  3 mg Oral QHS  . polyethylene glycol  17 g Oral BID  . senna-docusate  2 tablet Oral BID   Continuous Infusions: . sodium chloride 10 mL/hr at 05/30/2020 2345  . cefTRIAXone (ROCEPHIN)  IV 200 mL/hr at 06/13/20 1800  . doxycycline (VIBRAMYCIN) IV 100 mg (06/14/20 0526)    LOS: 4 days   Kerney Elbe, DO [facility] PAGER is on [place]  If 7PM-7AM, please contact night-coverage [email or web address]

## 2020-06-14 NOTE — Progress Notes (Signed)
Pt aware minimal verbal, labored breathing. The nurse asked if I would sit with him she had contacted the family. He told me the church he belonged to and that young people had taken over there. He also described how he struggled to get help and how grateful he is to his neighbor. The chaplain offered caring and supportive presence prayers, and blessings. Pt's niece came in before I left.

## 2020-06-14 NOTE — Progress Notes (Signed)
Pt with c/o increased shortness of breath Pt states "I am so tired and feel more short of breath" On 15 l  NRB Pt with O2 saturations 93-95%, MD updated via phone and new orders placed.

## 2020-06-14 NOTE — TOC Progression Note (Signed)
Transition of Care [facility]) - Progression Note    Patient Details  Name: ANTWOINE ZORN MRN: [id number] Date of Birth: [birth year]  Transition of Care [facility]) CM/SW Contact  Jadeyn Hargett, Juliann Pulse, RN Phone Number: 06/14/2020, 1:11 PM  Clinical Narrative:  Noted oncology note.Family to visit today. Will continue to follow for needs.     Expected Discharge Plan: [place] Barriers to Discharge: Continued Medical Work up  Expected Discharge Plan and Services Expected Discharge Plan: [place]   Discharge Planning Services: CM Consult Post Acute Care Choice: [place] Living arrangements for the past 2 months: Single Family Home                                       Social Determinants of Health (SDOH) Interventions    Readmission Risk Interventions No flowsheet data found.

## 2020-06-14 NOTE — Progress Notes (Signed)
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 He is much more short of breath and appears to be actively going downhill with respiratory failure to pneumonia vs pulmonary leukemic infiltrates. He notes "he is ready to go" and wants to be kept comfortable. Would like to proceed with actively managing his SOB/DOE with opiates. Comfort care order set placed per patients wishes. Tried to Call Dr Rebekah Chesterfield.  REVIEW OF SYSTEMS:  .10 Point review of Systems was done is negative except as noted above.  I have reviewed the past medical history, past surgical history, social history and family history with the patient and they are unchanged from previous note.   PHYSICAL EXAMINATION: ECOG PERFORMANCE STATUS: 2 - Symptomatic, <50% confined to bed  Vitals:   06/14/20 0500 06/14/20 0822  BP: 108/60   Pulse: (!) 113   Resp: (!) 32   Temp: 97.6 F (36.4 C)   SpO2: 95% 93%   Filed Weights   06/09/20 0133 06/10/20 0500 06/11/20 0546  Weight: 170 lb 6.7 oz (77.3 kg) 171 lb 1.2 oz (77.6 kg) 173 lb 15.1 oz (78.9 kg)    Intake/Output from previous day: 03/24 0701 - 03/25 0700 In: 1039.1 [P.O.:240; I.V.:28.7; Blood:304; IV Piggyback:466.4] Out: 675 [Urine:675]  GENERAL: mild/mod respiratory distress SKIN: pale. LUNGS: b/l rales with significantly increased WOB HEART: S1S2 tachy ABDOMEN: abd with mild distension NEURO: alert & oriented x 3 , distress due to shortness of breath.   LABORATORY DATA:  I have reviewed the data as listed CMP Latest Ref Rng & Units 06/14/2020 06/13/2020 06/12/2020  Glucose 70 - 99 mg/dL 346(H) 179(H) 174(H)  BUN 8 - 23 mg/dL 39(H) 25(H) 28(H)  Creatinine 0.61 - 1.24 mg/dL 1.62(H) 1.49(H) 1.30(H)  Sodium 135 - 145 mmol/L 135 138 138  Potassium 3.5 - 5.1 mmol/L 3.9 3.9 4.1  Chloride 98 - 111 mmol/L 106 110 110  CO2 22 - 32 mmol/L 18(L) 18(L) 19(L)  Calcium 8.9 - 10.3 mg/dL 7.9(L) 8.0(L) 8.4(L)  Total Protein 6.5 - 8.1 g/dL  5.3(L) 5.6(L) 5.6(L)  Total Bilirubin 0.3 - 1.2 mg/dL 1.2 0.9 1.3(H)  Alkaline Phos 38 - 126 U/L 144(H) 144(H) 151(H)  AST 15 - 41 U/L 20 20 21   ALT 0 - 44 U/L 19 23 26     Lab Results  Component Value Date   WBC 40.1 (H) 06/14/2020   HGB 7.6 (L) 06/14/2020   HCT 24.7 (L) 06/14/2020   MCV 96.1 06/14/2020   PLT 18 (LL) 06/14/2020   NEUTROABS 13.3 (H) 06/13/2020    DG Chest 2 View  Result Date: 06/03/2020 CLINICAL DATA:  Shortness of breath. EXAM: CHEST - 2 VIEW COMPARISON:  02/17/2018 FINDINGS: Both lungs are clear. Heart and mediastinum are within normal limits. No large pleural effusions. Negative for pneumothorax. Trachea is midline. IMPRESSION: No active cardiopulmonary disease. Electronically Signed   By: Markus Daft M.D.   On: 06/03/2020 11:44   CT HEAD WO CONTRAST  Result Date: 05/29/2020 CLINICAL DATA:  80 year old male with neurologic deficit. EXAM: CT HEAD WITHOUT CONTRAST TECHNIQUE: Contiguous axial images were obtained from the base of the skull through the vertex without intravenous contrast. COMPARISON:  None. FINDINGS: Brain: Mild age-related atrophy and moderate chronic microvascular ischemic changes. Left periventricular and white matter corona radiata hypodensity, chronic. There is no acute intracranial hemorrhage. No mass effect or midline shift. No extra-axial fluid collection. Vascular: No hyperdense vessel or unexpected calcification. Skull: Normal. Negative for fracture  or focal lesion. Sinuses/Orbits: No acute finding. Other: None IMPRESSION: 1. No acute intracranial pathology. 2. Age-related atrophy and chronic microvascular ischemic changes. Electronically Signed   By: Anner Crete M.D.   On: 05/31/2020 20:02   CT CHEST WO CONTRAST  Result Date: 06/12/2020 CLINICAL DATA:  Weakness, respiratory failure, anemic, dyspnea on exertion EXAM: CT CHEST WITHOUT CONTRAST TECHNIQUE: Multidetector CT imaging of the chest was performed following the standard protocol without  IV contrast. COMPARISON:  06/12/2020 FINDINGS: Cardiovascular: Unenhanced imaging of the heart and great vessels demonstrates no significant pericardial effusion. The heart is not enlarged. 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 Evaluation of the aortic lumen is limited without IV contrast. There is mild atherosclerosis of the aortic arch, with extensive atherosclerosis throughout the coronary vasculature. Mediastinum/Nodes: No enlarged mediastinal or axillary lymph nodes. Thyroid gland, trachea, and esophagus demonstrate no significant findings. Lungs/Pleura: Upper lobe predominant centrilobular emphysema. Bilateral perihilar ground-glass airspace disease with upper lobe predominant interlobular septal thickening suggests mild pulmonary edema. Trace left pleural effusion. No pneumothorax. The central airways are patent. Upper Abdomen: Spleen is markedly enlarged. No other acute upper abdominal findings. Musculoskeletal: No acute or destructive bony lesions. Reconstructed images demonstrate no additional findings. IMPRESSION: 1. Upper lobe predominant interlobular septal thickening and bilateral perihilar ground-glass airspace disease, consistent with mild edema. 2. Trace left pleural effusion. 3. Splenomegaly. 4. Marked coronary artery atherosclerosis. 5. Aortic Atherosclerosis (ICD10-I70.0) and Emphysema (ICD10-J43.9). Electronically Signed   By: Randa Ngo M.D.   On: 06/12/2020 20:00   CT Abdomen Pelvis W Contrast  Result Date: 06/03/2020 CLINICAL DATA:  Abdominal pain, constipation. EXAM: CT ABDOMEN AND PELVIS WITH CONTRAST TECHNIQUE: Multidetector CT imaging of the abdomen and pelvis was performed using the standard protocol following bolus administration of intravenous contrast. CONTRAST:  131m OMNIPAQUE IOHEXOL 300 MG/ML  SOLN COMPARISON:  None. FINDINGS: Lower chest: Minimal bibasilar subsegmental atelectasis is noted. Hepatobiliary: No focal liver  abnormality is seen. No gallstones, gallbladder wall thickening, or biliary dilatation. Pancreas: Unremarkable. No pancreatic ductal dilatation or surrounding inflammatory changes. Spleen: Calcified splenic granulomata are noted. Moderate splenomegaly is noted. Adrenals/Urinary Tract: Adrenal glands and kidneys are unremarkable. No hydronephrosis or renal obstruction is noted. No renal or ureteral calculi are noted. At least 2 small bladder diverticula are noted posteriorly and superiorly. Stomach/Bowel: Stomach appears normal. There is no evidence of bowel obstruction or inflammation. Status post appendectomy. Vascular/Lymphatic: Aortic atherosclerosis. No enlarged abdominal or pelvic lymph nodes. Reproductive: Mild prostatic enlargement is noted. Other: No abdominal wall hernia or abnormality. No abdominopelvic ascites. Musculoskeletal: No acute or significant osseous findings. IMPRESSION: 1. Moderate splenomegaly. 2. At least 2 small bladder diverticula are noted posteriorly and superiorly. 3. Mild prostatic enlargement. 4. Aortic atherosclerosis. Aortic Atherosclerosis (ICD10-I70.0). Electronically Signed   By: JMarijo ConceptionM.D.   On: 06/03/2020 14:32   CT BIOPSY  Result Date: 06/12/2020 INDICATION: AML EXAM: CT GUIDED RIGHT ILIAC BONE MARROW ASPIRATION AND CORE BIOPSY Date:  06/12/2020 06/12/2020 10:17 am Radiologist:  M. TDaryll Brod MD Guidance:  CT FLUOROSCOPY TIME:  Fluoroscopy Time: NONE. MEDICATIONS: 1% lidocaine local ANESTHESIA/SEDATION: 2.0 mg IV Versed; 50 mcg IV Fentanyl Moderate Sedation Time:  10 minutes The patient was continuously monitored during the procedure by the interventional radiology nurse under my direct supervision. CONTRAST:  None. COMPLICATIONS: None PROCEDURE: Informed consent was obtained from the patient following explanation of the procedure, risks, benefits and alternatives. The patient understands, agrees and consents for the procedure.  All questions were addressed. A  time out was performed. The patient was positioned prone and non-contrast localization CT was performed of the pelvis to demonstrate the iliac marrow spaces. Maximal barrier sterile technique utilized including caps, mask, sterile gowns, sterile gloves, large sterile drape, hand hygiene, and Betadine prep. Under sterile conditions and local anesthesia, an 11 gauge coaxial bone biopsy needle was advanced into the right iliac marrow space. Needle position was confirmed with CT imaging. Initially, bone marrow aspiration was performed. Next, the 11 gauge outer cannula was utilized to obtain a right iliac bone marrow core biopsy. Needle was removed. Hemostasis was obtained with compression. The patient tolerated the procedure well. Samples were prepared with the cytotechnologist. No immediate complications. IMPRESSION: CT guided right iliac bone marrow aspiration and core biopsy. Electronically Signed   By: Jerilynn Mages.  Shick M.D.   On: 06/12/2020 11:45   DG CHEST PORT 1 VIEW  Result Date: 06/13/2020 CLINICAL DATA:  Shortness of breath. EXAM: PORTABLE CHEST 1 VIEW COMPARISON:  CT 06/12/2020.  Chest x-ray 06/12/2020. FINDINGS: Mediastinum and hilar structures are unremarkable. Heart size normal. Bilateral pulmonary interstitial infiltrates/edema again noted. Interstitial infiltrates have progressed on the left. No pleural effusion or pneumothorax. Mild scoliosis thoracic spine. Subchondral cyst noted over the left humerus, most likely degenerative. IMPRESSION: Bilateral pulmonary interstitial infiltrates/edema again noted. Interstitial infiltrates/edema have progressed on the left. Electronically Signed   By: Marcello Moores  Register   On: 06/13/2020 05:51   DG CHEST PORT 1 VIEW  Result Date: 06/12/2020 CLINICAL DATA:  Dyspnea. EXAM: PORTABLE CHEST 1 VIEW COMPARISON:  June 08, 2020. FINDINGS: The heart size and mediastinal contours are within normal limits. No pneumothorax or pleural effusion is noted. Mildly increased bilateral  lung opacities are noted, right greater than left, concerning for multifocal pneumonia. The visualized skeletal structures are unremarkable. IMPRESSION: Mildly increased bilateral lung opacities are noted, right greater than left, concerning for multifocal pneumonia. Electronically Signed   By: Marijo Conception M.D.   On: 06/12/2020 11:13   DG Chest Portable 1 View  Result Date: 05/24/2020 CLINICAL DATA:  80 year old male with shortness of breath. EXAM: PORTABLE CHEST 1 VIEW COMPARISON:  Chest radiograph dated 06/03/2020. FINDINGS: No focal consolidation, pleural effusion, or pneumothorax. The cardiac silhouette is within limits. No acute osseous pathology. IMPRESSION: No active disease. Electronically Signed   By: Anner Crete M.D.   On: 06/13/2020 21:56   CT BONE MARROW BIOPSY & ASPIRATION  Result Date: 06/12/2020 INDICATION: AML EXAM: CT GUIDED RIGHT ILIAC BONE MARROW ASPIRATION AND CORE BIOPSY Date:  06/12/2020 06/12/2020 10:17 am Radiologist:  M. Daryll Brod, MD Guidance:  CT FLUOROSCOPY TIME:  Fluoroscopy Time: NONE. MEDICATIONS: 1% lidocaine local ANESTHESIA/SEDATION: 2.0 mg IV Versed; 50 mcg IV Fentanyl Moderate Sedation Time:  10 minutes The patient was continuously monitored during the procedure by the interventional radiology nurse under my direct supervision. CONTRAST:  None. COMPLICATIONS: None PROCEDURE: Informed consent was obtained from the patient following explanation of the procedure, risks, benefits and alternatives. The patient understands, agrees and consents for the procedure. All questions were addressed. A time out was performed. The patient was positioned prone and non-contrast localization CT was performed of the pelvis to demonstrate the iliac marrow spaces. Maximal barrier sterile technique utilized including caps, mask, sterile gowns, sterile gloves, large sterile drape, hand hygiene, and Betadine prep. Under sterile conditions and local anesthesia, an 11 gauge coaxial bone  biopsy needle was advanced into the right iliac marrow space. Needle position was confirmed with  CT imaging. Initially, bone marrow aspiration was performed. Next, the 11 gauge outer cannula was utilized to obtain a right iliac bone marrow core biopsy. Needle was removed. Hemostasis was obtained with compression. The patient tolerated the procedure well. Samples were prepared with the cytotechnologist. No immediate complications. IMPRESSION: CT guided right iliac bone marrow aspiration and core biopsy. Electronically Signed   By: Jerilynn Mages.  Shick M.D.   On: 06/12/2020 11:45   ECHOCARDIOGRAM COMPLETE  Result Date: 06/13/2020    ECHOCARDIOGRAM REPORT   Patient Name:   Steve Maldonado Date of Exam: 06/13/2020 Medical Rec #:  [id number]     Height:       75.0 in Accession #:    [id number]    Weight:       173.9 lb Date of Birth:  March 05, 1941     BSA:          2.068 m Patient Age:    59 years      BP:           107/60 mmHg Patient Gender: M             HR:           109 bpm. Exam Location:  Inpatient Procedure: 2D Echo, Color Doppler and Cardiac Doppler Indications:    Dyspnea R06.00  History:        Patient has prior history of Echocardiogram examinations, most                 recent 02/20/2018. Stroke, Signs/Symptoms:Dyspnea; Risk                 Factors:Hypertension, Diabetes and Dyslipidemia.  Sonographer:    Bernadene Person RDCS Referring Phys: [id number] Georgina Quint LATIF [place]  1. Left ventricular ejection fraction, by estimation, is 50 to 55%. The left ventricle has low normal function. The left ventricle demonstrates regional wall motion abnormalities (see scoring diagram/findings for description). There is mild concentric left ventricular hypertrophy. Left ventricular diastolic parameters were normal.  2. Right ventricular systolic function is mildly reduced. The right ventricular size is mildly enlarged.  3. The mitral valve is normal in structure. Mild mitral valve regurgitation. No evidence of mitral stenosis.  4.  The aortic valve is normal in structure. Aortic valve regurgitation is not visualized. Mild to moderate aortic valve sclerosis/calcification is present, without any evidence of aortic stenosis.  5. The inferior vena cava is normal in size with greater than 50% respiratory variability, suggesting right atrial pressure of 3 mmHg. FINDINGS  Left Ventricle: Left ventricular ejection fraction, by estimation, is 50 to 55%. The left ventricle has low normal function. The left ventricle demonstrates regional wall motion abnormalities. The left ventricular internal cavity size was normal in size. There is mild concentric left ventricular hypertrophy. Left ventricular diastolic parameters were normal.  LV Wall Scoring: The basal inferolateral segment is hypokinetic. Right Ventricle: The right ventricular size is mildly enlarged. No increase in right ventricular wall thickness. Right ventricular systolic function is mildly reduced. Left Atrium: Left atrial size was normal in size. Right Atrium: Right atrial size was normal in size. Pericardium: There is no evidence of pericardial effusion. Mitral Valve: The mitral valve is normal in structure. Mild mitral valve regurgitation. No evidence of mitral valve stenosis. Tricuspid Valve: The tricuspid valve is normal in structure. Tricuspid valve regurgitation is not demonstrated. No evidence of tricuspid stenosis. Aortic Valve: The aortic valve is normal in structure. Aortic valve regurgitation is not visualized. Mild to moderate  aortic valve sclerosis/calcification is present, without any evidence of aortic stenosis. Pulmonic Valve: The pulmonic valve was normal in structure. Pulmonic valve regurgitation is not visualized. No evidence of pulmonic stenosis. Aorta: The aortic root is normal in size and structure. Venous: The inferior vena cava is normal in size with greater than 50% respiratory variability, suggesting right atrial pressure of 3 mmHg. IAS/Shunts: No atrial level shunt  detected by color flow Doppler.  LEFT VENTRICLE PLAX 2D LVIDd:         4.40 cm LVIDs:         2.70 cm LV PW:         1.00 cm LV IVS:        1.00 cm LVOT diam:     2.00 cm LV SV:         62 LV SV Index:   30 LVOT Area:     3.14 cm  RIGHT VENTRICLE TAPSE (M-mode): 1.8 cm LEFT ATRIUM             Index       RIGHT ATRIUM           Index LA diam:        2.50 cm 1.21 cm/m  RA Area:     14.00 cm LA Vol (A2C):   28.2 ml 13.64 ml/m RA Volume:   36.80 ml  17.80 ml/m LA Vol (A4C):   28.2 ml 13.64 ml/m LA Biplane Vol: 31.2 ml 15.09 ml/m  AORTIC VALVE LVOT Vmax:   110.00 cm/s LVOT Vmean:  67.300 cm/s LVOT VTI:    0.196 m  AORTA Ao Root diam: 3.40 cm Ao Asc diam:  3.60 cm  SHUNTS Systemic VTI:  0.20 m Systemic Diam: 2.00 cm Ena Dawley MD Electronically signed by Ena Dawley MD Signature Date/Time: 06/13/2020/11:08:59 AM    Final     ASSESSMENT AND PLAN: 80 y.o. 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  02/23/18 BM Bx which revealed hypercellular bone marrow with dyspoietic changes 02/23/18 BM Flow Cytometry did not reveal a significant CD34 positive blastic population  #2  Thrombocytopenia, with significant worsening  #3 constipation/ileus  # 4 Significantly worsening SOB with mild/mod respiratory distress from pneumonia vs leukemic pulmonary infiltrates. PLAN:  Discussed with patient, RN and Dr Alfredia Ferguson -per patients wishes and given significantly worsening respiratory status will proceed to complete comfort care measures. -grave prognosis -- do not expect patient to be stable long enough to consider discharge to [facility]. -actively manage SOB/DOE with prn SL Oxycodone and IV Morphine -prn ativan for anxiety -room fan for  dyspnea -family aware of grave prognosis. -we pray for comfort for this amazing individual .  Sullivan Lone 06/14/20

## 2020-06-15 DIAGNOSIS — C9202 Acute myeloblastic leukemia, in relapse: Secondary | ICD-10-CM | POA: Diagnosis not present

## 2020-06-15 DIAGNOSIS — D649 Anemia, unspecified: Secondary | ICD-10-CM | POA: Diagnosis not present

## 2020-06-15 DIAGNOSIS — N1831 Chronic kidney disease, stage 3a: Secondary | ICD-10-CM | POA: Diagnosis not present

## 2020-06-15 DIAGNOSIS — D689 Coagulation defect, unspecified: Secondary | ICD-10-CM | POA: Diagnosis not present

## 2020-06-20 ENCOUNTER — Encounter (HOSPITAL_COMMUNITY): Payer: Self-pay

## 2020-06-21 NOTE — Death Summary Note (Signed)
DEATH SUMMARY   Patient Details  Name: Steve Maldonado MRN: [id number] DOB: Feb 03, 1941  Admission/Discharge Information   Admit Date:  11-Jun-2020  Date of Death: Date of Death: June 18, 2020  Time of Death: Time of Death: 04-25-1441  Length of Stay: 5  Referring Physician: Burnard Bunting, MD   Reason(s) for Hospitalization  Weakness and SOB on Exertion  Diagnoses  Preliminary cause of death:    Acute Cardiopulmonary Arrest in the Setting of Multifocal Leukemic Pneumonia and Infiltrate as it caused Respiratory Failure   Secondary Diagnoses (including complications and co-morbidities):  Principal Problem:   Anemia Active Problems:   SOB (shortness of breath)   Diabetes mellitus type 2 in nonobese (HCC)   Thrombocytopenia (HCC)   History of CVA in adulthood   Coronary artery disease of native artery of native heart with stable angina pectoris (HCC)   Chronic kidney disease, stage 3a (HCC)   Prolonged QT interval   Hypernatremia   Physical debility   Coagulopathy (HCC)   Symptomatic anemia   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 Jearld Fenton his MDS.  -His counts were stable as of October 2021. He did not require any blood transfusions.  -Came in with generalized weakness and fatigue and was noted to be significantly more anemic and thrombocytopenic than baseline. He was transfused 2 units of blood for symptomatic anemia. -Peripheral smear does suggest more than 20% blasts raising concern for transformation to acute myeloid leukemia. Dx confirms Acute Myeloid Leukemia -Dr. Ella Bodo subsequently consulted and following  -Current Plan is for a bone marrow biopsy and that is today 06/12/20.  -Dr. Honor Junes discussed goals of care with patient. Further Care per Heme/onc -Continue  to monitor for now.  -Leukocytosis was likely due to AML and has gone from 15.8 -> 20.3 -> 25.5 -> 40.1 -Per Oncology: "We would have to consider possible hypomethylating agent plus or minus venetoclax and significant burden of supportive transfusions ORbest supportive cares through hospice" -Further decisions to be made pending Bx Results and apparently Dr. Irene Limbo has spoken with Family about Dx and a decision has been made to transition to COMFORT -Will transition to [place] per Heme/Onc and Anticipated [facility] Death is expected given his significant decompensation and worsening.  -Now on SL Oxycodone and IV Morphine but because his pain was still uncontrolled yesterday and appears uncomfortable he was transitioned to morphine drip -Full comfort measures now and as expected had an In Hospital Death and passed away at 1443  Symptomatic anemia with profound generalized weakness/fatigue -Patient symptoms were initially thought to be due to his significant anemia.  -He was transfused 2 units of PRBC. Has not noticed much improvement in his symptoms.  -See above discussion as well. No overt bleeding noted.  -Anemia panel did not show any clear-cut deficiencies and showed an iron level of 55, U IBC 152, TIBC of 207, saturation ratios of 27%, ferritin level 309, folate level of 6.3, and vitamin B12 level of 1840 -Hemoglobin noted to be stable this morning. -Patient's hemoglobin/hematocrit is now gone from 8.3/27.0 -> 8.0/26.2 -> 7.3/24.8 -> 7.6/24.7 on last check  -Continues to be quite weak and fatigued.  -Unsafe to go back home since he does live by himself.  -Anticipated In Hospital Death given significant decline in his current status and as expected  passed away  Multifocal Pneumonia, poA -Initial CXR showed "No focal consolidation, pleural effusion, or pneumothorax. The cardiac silhouette is within limits. No acute osseous pathology." -Repeat CXR showed "Mildly increased  bilateral lung opacities are noted, right greater than left, concerning for multifocal pneumonia." -Start Abx Coverage as above -C/w Xopenex/Atrovent q6h, Guaifenesin 1200 mg po BID, Flutter Valve and Incentive Spirometry  -SpO2: 94 % O2 Flow Rate (L/min): 15 L/min FiO2 (%): 100 % -Continue to monitor respiratory status very carefully and maintain saturations greater than 90% -Continue supplemental oxygen via nasal cannula and wean O2 as tolerated -Check BNP and ECHOCardiogram to r/o Heart Failure -Strict I's and O's and Daily Weights; Patient is - 3.361 Liters  -WBC trended up to 20.3 -Repeat chest x-ray and will need amatory home O2 Screen prior to discharge -If Respiratory Status is not improving will obtain a Pulmonary Evaluation but in the interim will need to get a CT of the Chest w/o Contrast -CT of the chest done and showed "Upper lobe predominant interlobular septal thickening and bilateral perihilar ground-glass airspace disease, consistent with mild edema. Trace left pleural effusion.  Splenomegaly. 4. Marked coronary artery atherosclerosis. Aortic Atherosclerosis." -ABX stopping now; ECHO Was Normal -Pulmonary was consulted and feels that his bilateral interstitial opacities are consistent with leukemic infiltrates and they are obtaining a Procalcitonin and was 0.81 -> 2.69; Now Pulmonary Signed off given Transition to Comfort Measures  -They feel that the treatment of this is treating the underlying malignancy -They expect his oxygen requirements increased and suspect that he may be hospice appropriate and is too unstable for a bronchoscopy to exclude atypical infection -Now that AML has been diagnosed and Confirmed his Prognosis is very poor and He has been transitioned to [place] and Imminent Death is expected; because of him appearing uncomfortable we will transition to morphine drip and he remained on it   Thrombocytopenia -Most likely a result of his AML.  -Platelet  Count is 18 on last check -No bleeding noted.  -Will stop checking given Transition to Comfort  Coagulopathy -INR was noted to be elevated at 1.5.  -PTT is normal. Could be due to nutritional deficiencies.  -No history of liver disease. Vit K wasordered. -We will not continue to monitor and repeat in the morning  Acute on chronic kidney disease stage IIIa Metabolic Acidosis  -Baseline renal function appears to be around 1.2. Came in with creatinine of 1.5-1.6. Renal function worsened likely due to dehydration and had subsequently improved but now again worsening BUN/creatinine is now 28/1.30 -> 25/1.49 -> 39/1.62. (GFR is now 43) on last check -He does have a small Acidosis with a CO2 of 19, AG of 9, and Chloride Level of 110 -He was given IV fluids with improvement in creatinine today. Monitor urine output. Avoid nephrotoxic agents.  -Will not repeat lab work anymore given His Comfort Status  Constipation -C/w Bisacodyl 10 mg RC Daily Prn, Miralax 17 grams po BID, and Senna-Docusate 2 tab po BID -Will need a Sodium Phosphate Enema DC Daily PRN Severe Constipation  Hypernatremia -Came in with sodium of 151 and was hydrated.  -Sodium level has improved and has normalized and is 135 -Will not check as he is Comfort Care  Insulin-dependent diabetes mellitus type 2 -HbA1c 6.3 and repeat 5.9. Patient remains on Lantus and SSI.  -CBGs are reasonably well controlled and ranging from 153-277 -Will not Continue to Monitor Blood Sugars now that he is Full Comfort   History of Coronary  Artery Disease -Stable.  -Noted to be on statin but have discontinued now that he is comfort care  Prolonged QT interval -QTC 531 ms.  -We will not continue to monitor and replete electrolytes now that he is comfort care  Hyperbilirubinemia -Patient's T Bili had normalized to 1.2 -Will not Continue to [place] [facility] Course (including significant findings, care,  treatment, and services provided and events leading to death)  NAYEL PURDY is a 80 y.o. Caucasian male with a past medical history significant for but not limited to CAD, hypertension, history of CVA, type 2 diabetes mellitus, mild renal insufficiency and MDS who presented to the emergency department for the evaluation of weakness.  He is noted to be profoundly anemic and patient was dyspneic on exertion.  Of note he had a smear that showed greater than 20% blasts and likely has acute leukemia.  Dr. Claiborne Billings has been following and plan is for bone marrow biopsy today.  He continues to feel fatigued and continues mention difficulty breathing with minimal exertion with talking.  Chest x-ray today reveals a multifocal pneumonia so he has been started on CAP coverage with IV doxycycline and IV ceftriaxone.  Patient underwent a bone marrow biopsy 06/12/20 and would like the results of the bone marrow biopsy prior to making a final decision of whether or not to transition to comfort care.  Still has not had appropriate bowel movements and laxatives have been increased.  His respiratory status is worsening so we called pulmonary for further evaluation. His Bx report came back with AML and Oncology discussed with the patient and family and the focus of his care transitioned to comfort and now he is on FULL Comfort measures given progressively worsening SOB. His prognosis is very grim and he is extremely sick and actively dying and is not stable for even potential [place] placement. Anticipate In-Hospital Death and it is imminently pending within hours to days. All medications not in the patient's comfort were discontinued.  Because patient still appeared uncomfortable he was transitioned to a morphine drip.  Patient was unresponsive today but resting and having short shallow breathing.  As expected he further declined and passed away at 1443 and nursing pronounced his death.  Family was updated.   Pertinent  Labs and Studies  Significant Diagnostic Studies DG Chest 2 View  Result Date: 06/03/2020 CLINICAL DATA:  Shortness of breath. EXAM: CHEST - 2 VIEW COMPARISON:  02/17/2018 FINDINGS: Both lungs are clear. Heart and mediastinum are within normal limits. No large pleural effusions. Negative for pneumothorax. Trachea is midline. IMPRESSION: No active cardiopulmonary disease. Electronically Signed   By: Markus Daft M.D.   On: 06/03/2020 11:44   CT HEAD WO CONTRAST  Result Date: 06/03/2020 CLINICAL DATA:  80 year old male with neurologic deficit. EXAM: CT HEAD WITHOUT CONTRAST TECHNIQUE: Contiguous axial images were obtained from the base of the skull through the vertex without intravenous contrast. COMPARISON:  None. FINDINGS: Brain: Mild age-related atrophy and moderate chronic microvascular ischemic changes. Left periventricular and white matter corona radiata hypodensity, chronic. There is no acute intracranial hemorrhage. No mass effect or midline shift. No extra-axial fluid collection. Vascular: No hyperdense vessel or unexpected calcification. Skull: Normal. Negative for fracture or focal lesion. Sinuses/Orbits: No acute finding. Other: None IMPRESSION: 1. No acute intracranial pathology. 2. Age-related atrophy and chronic microvascular ischemic changes. Electronically Signed   By: Anner Crete M.D.   On: 05/25/2020 20:02   CT CHEST WO CONTRAST  Result  Date: 06/12/2020 CLINICAL DATA:  Weakness, respiratory failure, anemic, dyspnea on exertion EXAM: CT CHEST WITHOUT CONTRAST TECHNIQUE: Multidetector CT imaging of the chest was performed following the standard protocol without IV contrast. COMPARISON:  06/12/2020 FINDINGS: Cardiovascular: Unenhanced imaging of the heart and great vessels demonstrates no significant pericardial effusion. The heart is not enlarged. 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 Evaluation of the aortic lumen is limited without IV  contrast. There is mild atherosclerosis of the aortic arch, with extensive atherosclerosis throughout the coronary vasculature. Mediastinum/Nodes: No enlarged mediastinal or axillary lymph nodes. Thyroid gland, trachea, and esophagus demonstrate no significant findings. Lungs/Pleura: Upper lobe predominant centrilobular emphysema. Bilateral perihilar ground-glass airspace disease with upper lobe predominant interlobular septal thickening suggests mild pulmonary edema. Trace left pleural effusion. No pneumothorax. The central airways are patent. Upper Abdomen: Spleen is markedly enlarged. No other acute upper abdominal findings. Musculoskeletal: No acute or destructive bony lesions. Reconstructed images demonstrate no additional findings. IMPRESSION: 1. Upper lobe predominant interlobular septal thickening and bilateral perihilar ground-glass airspace disease, consistent with mild edema. 2. Trace left pleural effusion. 3. Splenomegaly. 4. Marked coronary artery atherosclerosis. 5. Aortic Atherosclerosis (ICD10-I70.0) and Emphysema (ICD10-J43.9). Electronically Signed   By: Randa Ngo M.D.   On: 06/12/2020 20:00   CT Abdomen Pelvis W Contrast  Result Date: 06/03/2020 CLINICAL DATA:  Abdominal pain, constipation. EXAM: CT ABDOMEN AND PELVIS WITH CONTRAST TECHNIQUE: Multidetector CT imaging of the abdomen and pelvis was performed using the standard protocol following bolus administration of intravenous contrast. CONTRAST:  123m OMNIPAQUE IOHEXOL 300 MG/ML  SOLN COMPARISON:  None. FINDINGS: Lower chest: Minimal bibasilar subsegmental atelectasis is noted. Hepatobiliary: No focal liver abnormality is seen. No gallstones, gallbladder wall thickening, or biliary dilatation. Pancreas: Unremarkable. No pancreatic ductal dilatation or surrounding inflammatory changes. Spleen: Calcified splenic granulomata are noted. Moderate splenomegaly is noted. Adrenals/Urinary Tract: Adrenal glands and kidneys are unremarkable. No  hydronephrosis or renal obstruction is noted. No renal or ureteral calculi are noted. At least 2 small bladder diverticula are noted posteriorly and superiorly. Stomach/Bowel: Stomach appears normal. There is no evidence of bowel obstruction or inflammation. Status post appendectomy. Vascular/Lymphatic: Aortic atherosclerosis. No enlarged abdominal or pelvic lymph nodes. Reproductive: Mild prostatic enlargement is noted. Other: No abdominal wall hernia or abnormality. No abdominopelvic ascites. Musculoskeletal: No acute or significant osseous findings. IMPRESSION: 1. Moderate splenomegaly. 2. At least 2 small bladder diverticula are noted posteriorly and superiorly. 3. Mild prostatic enlargement. 4. Aortic atherosclerosis. Aortic Atherosclerosis (ICD10-I70.0). Electronically Signed   By: JMarijo ConceptionM.D.   On: 06/03/2020 14:32   CT BIOPSY  Result Date: 06/12/2020 INDICATION: AML EXAM: CT GUIDED RIGHT ILIAC BONE MARROW ASPIRATION AND CORE BIOPSY Date:  06/12/2020 06/12/2020 10:17 am Radiologist:  M. TDaryll Brod MD Guidance:  CT FLUOROSCOPY TIME:  Fluoroscopy Time: NONE. MEDICATIONS: 1% lidocaine local ANESTHESIA/SEDATION: 2.0 mg IV Versed; 50 mcg IV Fentanyl Moderate Sedation Time:  10 minutes The patient was continuously monitored during the procedure by the interventional radiology nurse under my direct supervision. CONTRAST:  None. COMPLICATIONS: None PROCEDURE: Informed consent was obtained from the patient following explanation of the procedure, risks, benefits and alternatives. The patient understands, agrees and consents for the procedure. All questions were addressed. A time out was performed. The patient was positioned prone and non-contrast localization CT was performed of the pelvis to demonstrate the iliac marrow spaces. Maximal barrier sterile technique utilized including caps, mask, sterile gowns, sterile gloves, large sterile drape, hand  hygiene, and Betadine prep. Under sterile conditions and  local anesthesia, an 11 gauge coaxial bone biopsy needle was advanced into the right iliac marrow space. Needle position was confirmed with CT imaging. Initially, bone marrow aspiration was performed. Next, the 11 gauge outer cannula was utilized to obtain a right iliac bone marrow core biopsy. Needle was removed. Hemostasis was obtained with compression. The patient tolerated the procedure well. Samples were prepared with the cytotechnologist. No immediate complications. IMPRESSION: CT guided right iliac bone marrow aspiration and core biopsy. Electronically Signed   By: Jerilynn Mages.  Shick M.D.   On: 06/12/2020 11:45   DG CHEST PORT 1 VIEW  Result Date: 06/13/2020 CLINICAL DATA:  Shortness of breath. EXAM: PORTABLE CHEST 1 VIEW COMPARISON:  CT 06/12/2020.  Chest x-ray 06/12/2020. FINDINGS: Mediastinum and hilar structures are unremarkable. Heart size normal. Bilateral pulmonary interstitial infiltrates/edema again noted. Interstitial infiltrates have progressed on the left. No pleural effusion or pneumothorax. Mild scoliosis thoracic spine. Subchondral cyst noted over the left humerus, most likely degenerative. IMPRESSION: Bilateral pulmonary interstitial infiltrates/edema again noted. Interstitial infiltrates/edema have progressed on the left. Electronically Signed   By: Marcello Moores  Register   On: 06/13/2020 05:51   DG CHEST PORT 1 VIEW  Result Date: 06/12/2020 CLINICAL DATA:  Dyspnea. EXAM: PORTABLE CHEST 1 VIEW COMPARISON:  June 08, 2020. FINDINGS: The heart size and mediastinal contours are within normal limits. No pneumothorax or pleural effusion is noted. Mildly increased bilateral lung opacities are noted, right greater than left, concerning for multifocal pneumonia. The visualized skeletal structures are unremarkable. IMPRESSION: Mildly increased bilateral lung opacities are noted, right greater than left, concerning for multifocal pneumonia. Electronically Signed   By: Marijo Conception M.D.   On: 06/12/2020  11:13   DG Chest Portable 1 View  Result Date: 05/26/2020 CLINICAL DATA:  80 year old male with shortness of breath. EXAM: PORTABLE CHEST 1 VIEW COMPARISON:  Chest radiograph dated 06/03/2020. FINDINGS: No focal consolidation, pleural effusion, or pneumothorax. The cardiac silhouette is within limits. No acute osseous pathology. IMPRESSION: No active disease. Electronically Signed   By: Anner Crete M.D.   On: 06/05/2020 21:56   CT BONE MARROW BIOPSY & ASPIRATION  Result Date: 06/12/2020 INDICATION: AML EXAM: CT GUIDED RIGHT ILIAC BONE MARROW ASPIRATION AND CORE BIOPSY Date:  06/12/2020 06/12/2020 10:17 am Radiologist:  M. Daryll Brod, MD Guidance:  CT FLUOROSCOPY TIME:  Fluoroscopy Time: NONE. MEDICATIONS: 1% lidocaine local ANESTHESIA/SEDATION: 2.0 mg IV Versed; 50 mcg IV Fentanyl Moderate Sedation Time:  10 minutes The patient was continuously monitored during the procedure by the interventional radiology nurse under my direct supervision. CONTRAST:  None. COMPLICATIONS: None PROCEDURE: Informed consent was obtained from the patient following explanation of the procedure, risks, benefits and alternatives. The patient understands, agrees and consents for the procedure. All questions were addressed. A time out was performed. The patient was positioned prone and non-contrast localization CT was performed of the pelvis to demonstrate the iliac marrow spaces. Maximal barrier sterile technique utilized including caps, mask, sterile gowns, sterile gloves, large sterile drape, hand hygiene, and Betadine prep. Under sterile conditions and local anesthesia, an 11 gauge coaxial bone biopsy needle was advanced into the right iliac marrow space. Needle position was confirmed with CT imaging. Initially, bone marrow aspiration was performed. Next, the 11 gauge outer cannula was utilized to obtain a right iliac bone marrow core biopsy. Needle was removed. Hemostasis was obtained with compression. The patient tolerated  the procedure well. Samples were prepared with the cytotechnologist.  No immediate complications. IMPRESSION: CT guided right iliac bone marrow aspiration and core biopsy. Electronically Signed   By: Jerilynn Mages.  Shick M.D.   On: 06/12/2020 11:45   ECHOCARDIOGRAM COMPLETE  Result Date: 06/13/2020    ECHOCARDIOGRAM REPORT   Patient Name:   KYSHON TOLLIVER Date of Exam: 06/13/2020 Medical Rec #:  [id number]     Height:       75.0 in Accession #:    [id number]    Weight:       173.9 lb Date of Birth:  [birth year]     BSA:          2.068 m Patient Age:    66 years      BP:           107/60 mmHg Patient Gender: M             HR:           109 bpm. Exam Location:  Inpatient Procedure: 2D Echo, Color Doppler and Cardiac Doppler Indications:    Dyspnea R06.00  History:        Patient has prior history of Echocardiogram examinations, most                 recent 02/20/2018. Stroke, Signs/Symptoms:Dyspnea; Risk                 Factors:Hypertension, Diabetes and Dyslipidemia.  Sonographer:    Bernadene Person RDCS Referring Phys: [id number] Georgina Quint LATIF [place]  1. Left ventricular ejection fraction, by estimation, is 50 to 55%. The left ventricle has low normal function. The left ventricle demonstrates regional wall motion abnormalities (see scoring diagram/findings for description). There is mild concentric left ventricular hypertrophy. Left ventricular diastolic parameters were normal.  2. Right ventricular systolic function is mildly reduced. The right ventricular size is mildly enlarged.  3. The mitral valve is normal in structure. Mild mitral valve regurgitation. No evidence of mitral stenosis.  4. The aortic valve is normal in structure. Aortic valve regurgitation is not visualized. Mild to moderate aortic valve sclerosis/calcification is present, without any evidence of aortic stenosis.  5. The inferior vena cava is normal in size with greater than 50% respiratory variability, suggesting right atrial pressure of 3 mmHg.  FINDINGS  Left Ventricle: Left ventricular ejection fraction, by estimation, is 50 to 55%. The left ventricle has low normal function. The left ventricle demonstrates regional wall motion abnormalities. The left ventricular internal cavity size was normal in size. There is mild concentric left ventricular hypertrophy. Left ventricular diastolic parameters were normal.  LV Wall Scoring: The basal inferolateral segment is hypokinetic. Right Ventricle: The right ventricular size is mildly enlarged. No increase in right ventricular wall thickness. Right ventricular systolic function is mildly reduced. Left Atrium: Left atrial size was normal in size. Right Atrium: Right atrial size was normal in size. Pericardium: There is no evidence of pericardial effusion. Mitral Valve: The mitral valve is normal in structure. Mild mitral valve regurgitation. No evidence of mitral valve stenosis. Tricuspid Valve: The tricuspid valve is normal in structure. Tricuspid valve regurgitation is not demonstrated. No evidence of tricuspid stenosis. Aortic Valve: The aortic valve is normal in structure. Aortic valve regurgitation is not visualized. Mild to moderate aortic valve sclerosis/calcification is present, without any evidence of aortic stenosis. Pulmonic Valve: The pulmonic valve was normal in structure. Pulmonic valve regurgitation is not visualized. No evidence of pulmonic stenosis. Aorta: The aortic root is normal in size and structure. Venous: The inferior vena  cava is normal in size with greater than 50% respiratory variability, suggesting right atrial pressure of 3 mmHg. IAS/Shunts: No atrial level shunt detected by color flow Doppler.  LEFT VENTRICLE PLAX 2D LVIDd:         4.40 cm LVIDs:         2.70 cm LV PW:         1.00 cm LV IVS:        1.00 cm LVOT diam:     2.00 cm LV SV:         62 LV SV Index:   30 LVOT Area:     3.14 cm  RIGHT VENTRICLE TAPSE (M-mode): 1.8 cm LEFT ATRIUM             Index       RIGHT ATRIUM            Index LA diam:        2.50 cm 1.21 cm/m  RA Area:     14.00 cm LA Vol (A2C):   28.2 ml 13.64 ml/m RA Volume:   36.80 ml  17.80 ml/m LA Vol (A4C):   28.2 ml 13.64 ml/m LA Biplane Vol: 31.2 ml 15.09 ml/m  AORTIC VALVE LVOT Vmax:   110.00 cm/s LVOT Vmean:  67.300 cm/s LVOT VTI:    0.196 m  AORTA Ao Root diam: 3.40 cm Ao Asc diam:  3.60 cm  SHUNTS Systemic VTI:  0.20 m Systemic Diam: 2.00 cm Ena Dawley MD Electronically signed by Ena Dawley MD Signature Date/Time: 06/13/2020/11:08:59 AM    Final     Microbiology Recent Results (from the past 240 hour(s))  Resp Panel by RT-PCR (Flu A&B, Covid) Nasopharyngeal Swab     Status: None   Collection Time: 05/25/2020 10:27 PM   Specimen: Nasopharyngeal Swab; Nasopharyngeal(NP) swabs in vial transport medium  Result Value Ref Range Status   SARS Coronavirus 2 by RT PCR NEGATIVE NEGATIVE Final    Comment: (NOTE) SARS-CoV-2 target nucleic acids are NOT DETECTED.  The SARS-CoV-2 RNA is generally detectable in upper respiratory specimens during the acute phase of infection. The lowest concentration of SARS-CoV-2 viral copies this assay can detect is 138 copies/mL. A negative result does not preclude SARS-Cov-2 infection and should not be used as the sole basis for treatment or other patient management decisions. A negative result may occur with  improper specimen collection/handling, submission of specimen other than nasopharyngeal swab, presence of viral mutation(s) within the areas targeted by this assay, and inadequate number of viral copies(<138 copies/mL). A negative result must be combined with clinical observations, patient history, and epidemiological information. The expected result is Negative.  Fact Sheet for Patients:  EntrepreneurPulse.com.au  Fact Sheet for Healthcare Providers:  IncredibleEmployment.be  This test is no t yet approved or cleared by the [place] FDA and  has been  authorized for detection and/or diagnosis of SARS-CoV-2 by FDA under an Emergency Use Authorization (EUA). This EUA will remain  in effect (meaning this test can be used) for the duration of the COVID-19 declaration under Section 564(b)(1) of the Act, 21 U.S.C.section 360bbb-3(b)(1), unless the authorization is terminated  or revoked sooner.       Influenza A by PCR NEGATIVE NEGATIVE Final   Influenza B by PCR NEGATIVE NEGATIVE Final    Comment: (NOTE) The Xpert Xpress SARS-CoV-2/FLU/RSV plus assay is intended as an aid in the diagnosis of influenza from Nasopharyngeal swab specimens and should not be used as a sole basis for  treatment. Nasal washings and aspirates are unacceptable for Xpert Xpress SARS-CoV-2/FLU/RSV testing.  Fact Sheet for Patients: EntrepreneurPulse.com.au  Fact Sheet for Healthcare Providers: IncredibleEmployment.be  This test is not yet approved or cleared by the [place] FDA and has been authorized for detection and/or diagnosis of SARS-CoV-2 by FDA under an Emergency Use Authorization (EUA). This EUA will remain in effect (meaning this test can be used) for the duration of the COVID-19 declaration under Section 564(b)(1) of the Act, 21 U.S.C. section 360bbb-3(b)(1), unless the authorization is terminated or revoked.  Performed at [facility], [place] [address] [place], [place] [address]    Lab Basic Metabolic Panel: Recent Labs  Lab 06/09/20 0539 06/10/20 0457 06/11/20 0550 06/12/20 0523 06/13/20 0517 06/14/20 0508  NA 145 139 140 138 138 135  K 3.8 4.4 4.5 4.1 3.9 3.9  CL 110 109 111 110 110 106  CO2 24 23 20* 19* 18* 18*  GLUCOSE 242* 174* 182* 174* 179* 346*  BUN 34* 36* 28* 28* 25* 39*  CREATININE 1.69* 1.80* 1.50* 1.30* 1.49* 1.62*  CALCIUM 7.9* 8.0* 8.5* 8.4* 8.0* 7.9*  MG 2.6*  --   --   --  2.2  --   PHOS  --   --   --   --  2.1*  --    Liver Function Tests: Recent Labs   Lab 06/10/20 0457 06/11/20 0550 06/12/20 0523 06/13/20 0517 06/14/20 0508  AST _0 ALT _1 ALKPHOS 96 126 151* 144* 144*  BILITOT 1.3* 1.3* 1.3* 0.9 1.2  PROT 5.4* 5.8* 5.6* 5.6* 5.3*  ALBUMIN 3.0* 3.1* 2.9* 2.8* 2.7*   No results for input(s): LIPASE, AMYLASE in the last 168 hours. No results for input(s): AMMONIA in the last 168 hours. CBC: Recent Labs  Lab 05/29/2020 1824 06/09/20 0539 06/10/20 0848 06/11/20 0807 06/12/20 0523 06/13/20 0517 06/14/20 0508  WBC 10.8*   < > 13.4* 15.8* 20.3* 25.5* 40.1*  NEUTROABS 5.8  --   --  8.1* 8.1* 13.3*  --   HGB 7.4*   < > 8.6* 8.3* 8.0* 7.3* 7.6*  HCT 26.1*   < > 28.2* 27.0* 26.2* 24.0* 24.7*  MCV 102.0*   < > 98.9 97.8 97.8 98.0 96.1  PLT 31*   < > 25* 16* 16* 16* 18*   < > = values in this interval not displayed.   Cardiac Enzymes: Recent Labs  Lab 06/09/20 0539  CKTOTAL 52   Sepsis Labs: Recent Labs  Lab 06/11/20 0807 06/12/20 0523 06/13/20 0517 06/14/20 0508  PROCALCITON  --   --  0.81 2.69  WBC 15.8* 20.3* 25.5* 40.1*    Procedures/Operations   CT BM ASP AND CORE3/23/22  Consultants:   Medical Oncology  Interventional Radiology  Raiford Noble, DO [facility] June 22, 2020, 3:23 PM

## 2020-06-21 NOTE — Progress Notes (Addendum)
Pt passed away @ 1443, CN @ bedside. MD made aware. Niece Sondra Come in the bldg, made aware.Remaining  Mophine drip wasted with Myriam Jacobson CN. [facility] was called  with Service # [phone]. Post mortem care done.

## 2020-06-21 NOTE — Progress Notes (Signed)
PROGRESS NOTE    Steve Maldonado  WUJ:[id number] DOB: April 05, 1940 DOA: 06/14/2020 PCP: Burnard Bunting, MD   Brief Narrative:  The patient is an 80 year old Caucasian male with a past medical history significant for but not limited to CAD, hypertension, history of CVA, type 2 diabetes mellitus, mild renal insufficiency and MDS who presented to the emergency department for the evaluation of weakness.  He is noted to be profoundly anemic and patient was dyspneic on exertion.  Of note he had a smear that showed greater than 20% blasts and likely has acute leukemia.  Dr. Claiborne Billings has been following and plan is for bone marrow biopsy today.  He continues to feel fatigued and continues mention difficulty breathing with minimal exertion with talking.  Chest x-ray today reveals a multifocal pneumonia so he has been started on CAP coverage with IV doxycycline and IV ceftriaxone.  Patient underwent a bone marrow biopsy 06/12/20 and would like the results of the bone marrow biopsy prior to making a final decision of whether or not to transition to comfort care.  Still has not had appropriate bowel movements and laxatives have been increased.  His respiratory status is worsening so we called pulmonary for further evaluation. His Bx report came back with AML and Oncology discussed with the patient and family and the focus of his care transitioned to comfort and now he is on FULL Comfort measures given progressively worsening SOB. His prognosis is very grim and he is extremely sick and actively dying and is not stable for even potential [place] placement. Anticipate In-Hospital Death and it is imminently pending within hours to days. All medications not in the patient's comfort to be discontinued.  Because patient still appears uncomfortable he is transition to a morphine drip.  Patient was unresponsive today but resting and having short shallow breathing.  Assessment & Plan:   Principal Problem:   Anemia Active  Problems:   SOB (shortness of breath)   Diabetes mellitus type 2 in nonobese (HCC)   Thrombocytopenia (HCC)   History of CVA in adulthood   Coronary artery disease of native artery of native heart with stable angina pectoris (HCC)   Chronic kidney disease, stage 3a (HCC)   Prolonged QT interval   Hypernatremia   Physical debility   Coagulopathy (HCC)   Symptomatic anemia   AML (acute myeloblastic leukemia) (HCC)   Pancytopenia ([place])   Counseling regarding advance care planning and goals of care   Terminal care  Acute Myeloid Leukemia/History of Myelodysplastic Syndrome -Patient has been previously followed by Dr. Irene Limbo for his MDS.   -His counts were stable as of October 2021.  He did not require any blood transfusions.   -Came in with generalized weakness and fatigue and was noted to be significantly more anemic and thrombocytopenic than baseline.  He was transfused 2 units of blood for symptomatic anemia. -Peripheral smear does suggest more than 20% blasts raising concern for transformation to acute myeloid leukemia. Dx confirms Acute Myeloid Leukemia -Dr. Irene Limbo was subsequently consulted and following  -Current Plan is for a bone marrow biopsy and that is today 06/12/20.   -Dr. Irene Limbo has discussed goals of care with patient.  Further Care per Heme/onc -Continue to monitor for now.  -Leukocytosis was likely due to AML and has gone from 15.8 -> 20.3 -> 25.5 -> 40.1 -Per Oncology: "We would have to consider possible hypomethylating agent plus or minus venetoclax and significant burden of supportive transfusions OR best supportive cares through hospice" -  Further decisions to be made pending Bx Results and apparently Dr. Irene Limbo has spoken with Family about Dx and a decision has been made to transition to COMFORT -Will transition to [place] per Heme/Onc and Anticipated [facility] Death is expected given his significant decompensation and worsening.  -Now on SL Oxycodone and IV  Morphine but because his pain was still uncontrolled yesterday and appears uncomfortable he was transitioned to morphine drip -Full comfort measures now  Symptomatic anemia with profound generalized weakness/fatigue -Patient symptoms were initially thought to be due to his significant anemia.   -He was transfused 2 units of PRBC.  Has not noticed much improvement in his symptoms.   -See above discussion as well.  No overt bleeding noted.   -Anemia panel did not show any clear-cut deficiencies and showed an iron level of 55, U IBC 152, TIBC of 207, saturation ratios of 27%, ferritin level 309, folate level of 6.3, and vitamin B12 level of 1840 -Hemoglobin noted to be stable this morning. -Patient's hemoglobin/hematocrit is now gone from 8.3/27.0 -> 8.0/26.2 -> 7.3/24.8 -> 7.6/24.7 on last check  -Continues to be quite weak and fatigued.   -Unsafe to go back home since he does live by himself.   -Anticipate In Hospital Death given significant decline in his current status  Multifocal Pneumonia, poA -Initial CXR showed "No focal consolidation, pleural effusion, or pneumothorax. The cardiac silhouette is within limits. No acute osseous pathology." -Repeat CXR showed "Mildly increased bilateral lung opacities are noted, right greater than left, concerning for multifocal pneumonia." -Start Abx Coverage as above -C/w Xopenex/Atrovent q6h, Guaifenesin 1200 mg po BID, Flutter Valve and Incentive Spirometry  -SpO2: 94 % O2 Flow Rate (L/min): 15 L/min FiO2 (%): 100 % -Continue to monitor respiratory status very carefully and maintain saturations greater than 90% -Continue supplemental oxygen via nasal cannula and wean O2 as tolerated -Check BNP and ECHOCardiogram to r/o Heart Failure -Strict I's and O's and Daily Weights; Patient is - 3.361 Liters  -WBC trended up to 20.3 -Repeat chest x-ray and will need amatory home O2 Screen prior to discharge -If Respiratory Status is not improving will obtain a  Pulmonary Evaluation but in the interim will need to get a CT of the Chest w/o Contrast -CT of the chest done and showed "Upper lobe predominant interlobular septal thickening and bilateral perihilar ground-glass airspace disease, consistent with mild edema. Trace left pleural effusion.  Splenomegaly. 4. Marked coronary artery atherosclerosis. Aortic Atherosclerosis." -ABX stopping now; ECHO Was Normal -Pulmonary was consulted and feels that his bilateral interstitial opacities are consistent with leukemic infiltrates and they are obtaining a Procalcitonin and was 0.81 -> 2.69; Now Pulmonary Signed off given Transition to Comfort Measures  -They feel that the treatment of this is treating the underlying malignancy -They expect his oxygen requirements increased and suspect that he may be hospice appropriate and is too unstable for a bronchoscopy to exclude atypical infection -Now that AML has been diagnosed and Confirmed his Prognosis is very poor and He has been transitioned to [place] and Imminent Death is expected; because of him appearing uncomfortable we will transition to morphine drip yesterday  Thrombocytopenia -Most likely a result of his AML.   -Platelet Count is 18  on last check -No bleeding noted.   -Will stop checking given Transition to Comfort  Coagulopathy -INR was noted to be elevated at 1.5.   -PTT is normal.  Could be due to nutritional deficiencies.   -No history of liver  disease. Vit K was ordered. -We will not continue to monitor and repeat in the morning  Acute on chronic kidney disease stage IIIa Metabolic Acidosis  -Baseline renal function appears to be around 1.2.  Came in with creatinine of 1.5-1.6.  Renal function worsened likely due to dehydration and had subsequently improved but now again worsening BUN/creatinine is now 28/1.30 -> 25/1.49 -> 39/1.62. (GFR is now 43) on last check -He does have a small Acidosis with a CO2 of 19, AG of 9, and Chloride Level  of 110 -He was given IV fluids with improvement in creatinine today.  Monitor urine output.  Avoid nephrotoxic agents.   -Will not repeat lab work anymore  Constipation -C/w Bisacodyl 10 mg RC Daily Prn, Miralax 17 grams po BID, and Senna-Docusate 2 tab po BID -Will need a Sodium Phosphate Enema DC Daily PRN Severe Constipation  Hypernatremia -Came in with sodium of 151 and was hydrated.   -Sodium level has improved and has normalized and is 135 -Will not check as he is Comfort Care  Insulin-dependent diabetes mellitus type 2 -HbA1c 6.3 and repeat 5.9.  Patient remains on Lantus and SSI.   -CBGs are reasonably well controlled and ranging from 153-249 -Will not Continue to Monitor Blood Sugars now that he is Full Comfort   History of Coronary Artery Disease -Stable.  -Noted to be on statin but have discontinued now that he is comfort care  Prolonged QT interval -QTC 531 ms.   -We will not continue to monitor and replete electrolytes now that he is comfort care  Hyperbilirubinemia -Patient's T Bili had normalized to 1.2 -Will not Continue to Monitor and Trend  DVT prophylaxis: SCDs given Thromobocytopenia and Anemia  Code Status: DO NOT RESUSCITATE Family Communication: Discussed with his niece from [place] at bedside Disposition Plan: Anticipate in-hospital death given that his prognosis is extremely poor and feel like he will not make it out of this Hospitalization given his worsened O2 Requirement and current condition  Status is: Inpatient  Remains inpatient appropriate because:Unsafe d/c plan, IV treatments appropriate due to intensity of illness or inability to take PO and Inpatient level of care appropriate due to severity of illness   Dispo:  Patient From: Home  Planned Disposition: In Hosptial Death   Medically stable for discharge: No    Consultants:   Medical Oncology; OF NOTE Tried to get in touch with Dr. Irene Limbo with no response   Interventional  Radiology   Procedures: CT BM ASP AND CORE  06/12/20  Antimicrobials:  Anti-infectives (From admission, onward)   Start     Dose/Rate Route Frequency Ordered Stop   06/12/20 1700  doxycycline (VIBRAMYCIN) 100 mg in sodium chloride 0.9 % 250 mL IVPB  Status:  Discontinued        100 mg 125 mL/hr over 120 Minutes Intravenous Every 12 hours 06/12/20 1500 06/14/20 1448   06/12/20 1600  cefTRIAXone (ROCEPHIN) 1 g in sodium chloride 0.9 % 100 mL IVPB  Status:  Discontinued        1 g 200 mL/hr over 30 Minutes Intravenous Every 24 hours 06/12/20 1500 06/14/20 1448        Subjective: Seen and examined at bedside and he is unresponsive and is having short shallow breathing.  Not awake alert enough to participate in examination.  Family at bedside.  No other concerns or complaints at this time but nurses have been giving him as needed morphine in addition to his morphine drip  Objective:  Vitals:   06/14/20 0500 06/14/20 0822 06/14/20 2118 2020-07-05 0458  BP: 108/60  106/65   Pulse: (!) 113  (!) 135   Resp: (!) 32  (!) 40   Temp: 97.6 F (36.4 C)     TempSrc:      SpO2: 95% 93% 94%   Weight:    77.6 kg  Height:        Intake/Output Summary (Last 24 hours) at 07-05-2020 1252 Last data filed at 2020-07-05 0451 Gross per 24 hour  Intake 11.47 ml  Output 1300 ml  Net -1288.53 ml   Filed Weights   06/10/20 0500 06/11/20 0546 05-Jul-2020 0458  Weight: 77.6 kg 78.9 kg 77.6 kg   Examination: Physical Exam:  Constitutional: The patient is a thin elderly chronically ill-appearing Caucasian male on nonrebreather who is unresponsive and mildly uncomfortable Eyes: Eyes are closed ENMT: External Ears, Nose appear normal. Grossly normal hearing.  Neck: Appears normal, supple, no cervical masses, normal ROM, no appreciable thyromegaly; no appreciable JVD Respiratory: Diminished to auscultation bilaterally with coarse breath sounds and has short shallow breathing and slightly increased respiratory  effort on nonrebreather Cardiovascular: Tachycardic rate, no murmurs / rubs / gallops. S1 and S2 auscultated. No extremity edema.  Abdomen: Soft, non-tender, non-distended.  Bowel sounds positive.  GU: Deferred. Musculoskeletal: No clubbing / cyanosis of digits/nails. No joint deformity upper and lower extremities.  Skin: No rashes, lesions, ulcers on limited skin evaluation. No induration; Warm and dry.  Neurologic: Patient is not awake enough to participate in examination and does not arouse to physical or verbal stimuli Psychiatric: Impaired judgment and insight.  He is not awake or alert and is not oriented x 3.  Appears calm  Data Reviewed: I have personally reviewed following labs and imaging studies  CBC: Recent Labs  Lab 06/06/2020 1824 06/09/20 0539 06/10/20 0848 06/11/20 0807 06/12/20 0523 06/13/20 0517 06/14/20 0508  WBC 10.8*   < > 13.4* 15.8* 20.3* 25.5* 40.1*  NEUTROABS 5.8  --   --  8.1* 8.1* 13.3*  --   HGB 7.4*   < > 8.6* 8.3* 8.0* 7.3* 7.6*  HCT 26.1*   < > 28.2* 27.0* 26.2* 24.0* 24.7*  MCV 102.0*   < > 98.9 97.8 97.8 98.0 96.1  PLT 31*   < > 25* 16* 16* 16* 18*   < > = values in this interval not displayed.   Basic Metabolic Panel: Recent Labs  Lab 06/09/20 0539 06/10/20 0457 06/11/20 0550 06/12/20 0523 06/13/20 0517 06/14/20 0508  NA 145 139 140 138 138 135  K 3.8 4.4 4.5 4.1 3.9 3.9  CL 110 109 111 110 110 106  CO2 24 23 20* 19* 18* 18*  GLUCOSE 242* 174* 182* 174* 179* 346*  BUN 34* 36* 28* 28* 25* 39*  CREATININE 1.69* 1.80* 1.50* 1.30* 1.49* 1.62*  CALCIUM 7.9* 8.0* 8.5* 8.4* 8.0* 7.9*  MG 2.6*  --   --   --  2.2  --   PHOS  --   --   --   --  2.1*  --    GFR: Estimated Creatinine Clearance: 39.9 mL/min (A) (by C-G formula based on SCr of 1.62 mg/dL (H)). Liver Function Tests: Recent Labs  Lab 06/10/20 0457 06/11/20 0550 06/12/20 0523 06/13/20 0517 06/14/20 0508  AST 23 26 21 20 20   ALT 21 27 26 23 19   ALKPHOS 96 126 151* 144* 144*   BILITOT 1.3* 1.3* 1.3* 0.9 1.2  PROT 5.4* 5.8*  5.6* 5.6* 5.3*  ALBUMIN 3.0* 3.1* 2.9* 2.8* 2.7*   No results for input(s): LIPASE, AMYLASE in the last 168 hours. No results for input(s): AMMONIA in the last 168 hours. Coagulation Profile: Recent Labs  Lab 05/26/2020 1824  INR 1.5*   Cardiac Enzymes: Recent Labs  Lab 06/09/20 0539  CKTOTAL 52   BNP (last 3 results) No results for input(s): PROBNP in the last 8760 hours. HbA1C: No results for input(s): HGBA1C in the last 72 hours. CBG: Recent Labs  Lab 06/13/20 1103 06/13/20 1616 06/13/20 2127 06/14/20 0729 06/14/20 1135  GLUCAP 277* 203* 189* 279* 249*   Lipid Profile: No results for input(s): CHOL, HDL, LDLCALC, TRIG, CHOLHDL, LDLDIRECT in the last 72 hours. Thyroid Function Tests: No results for input(s): TSH, T4TOTAL, FREET4, T3FREE, THYROIDAB in the last 72 hours. Anemia Panel: No results for input(s): VITAMINB12, FOLATE, FERRITIN, TIBC, IRON, RETICCTPCT in the last 72 hours. Sepsis Labs: Recent Labs  Lab 06/13/20 0517 06/14/20 0508  PROCALCITON 0.81 2.69    Recent Results (from the past 240 hour(s))  Resp Panel by RT-PCR (Flu A&B, Covid) Nasopharyngeal Swab     Status: None   Collection Time: 06/17/2020 10:27 PM   Specimen: Nasopharyngeal Swab; Nasopharyngeal(NP) swabs in vial transport medium  Result Value Ref Range Status   SARS Coronavirus 2 by RT PCR NEGATIVE NEGATIVE Final    Comment: (NOTE) SARS-CoV-2 target nucleic acids are NOT DETECTED.  The SARS-CoV-2 RNA is generally detectable in upper respiratory specimens during the acute phase of infection. The lowest concentration of SARS-CoV-2 viral copies this assay can detect is 138 copies/mL. A negative result does not preclude SARS-Cov-2 infection and should not be used as the sole basis for treatment or other patient management decisions. A negative result may occur with  improper specimen collection/handling, submission of specimen other than  nasopharyngeal swab, presence of viral mutation(s) within the areas targeted by this assay, and inadequate number of viral copies(<138 copies/mL). A negative result must be combined with clinical observations, patient history, and epidemiological information. The expected result is Negative.  Fact Sheet for Patients:  EntrepreneurPulse.com.au  Fact Sheet for Healthcare Providers:  IncredibleEmployment.be  This test is no t yet approved or cleared by the [place] FDA and  has been authorized for detection and/or diagnosis of SARS-CoV-2 by FDA under an Emergency Use Authorization (EUA). This EUA will remain  in effect (meaning this test can be used) for the duration of the COVID-19 declaration under Section 564(b)(1) of the Act, 21 U.S.C.section 360bbb-3(b)(1), unless the authorization is terminated  or revoked sooner.       Influenza A by PCR NEGATIVE NEGATIVE Final   Influenza B by PCR NEGATIVE NEGATIVE Final    Comment: (NOTE) The Xpert Xpress SARS-CoV-2/FLU/RSV plus assay is intended as an aid in the diagnosis of influenza from Nasopharyngeal swab specimens and should not be used as a sole basis for treatment. Nasal washings and aspirates are unacceptable for Xpert Xpress SARS-CoV-2/FLU/RSV testing.  Fact Sheet for Patients: EntrepreneurPulse.com.au  Fact Sheet for Healthcare Providers: IncredibleEmployment.be  This test is not yet approved or cleared by the [place] FDA and has been authorized for detection and/or diagnosis of SARS-CoV-2 by FDA under an Emergency Use Authorization (EUA). This EUA will remain in effect (meaning this test can be used) for the duration of the COVID-19 declaration under Section 564(b)(1) of the Act, 21 U.S.C. section 360bbb-3(b)(1), unless the authorization is terminated or revoked.  Performed at [facility],  [place] [address] [place], [place] [address]      RN Pressure Injury Documentation:     Estimated body mass index is 21.38 kg/m as calculated from the following:   Height as of this encounter: 6' 3"  (1.905 m).   Weight as of this encounter: 77.6 kg.  Malnutrition Type:  Nutrition Problem: Increased nutrient needs Etiology: acute illness   Malnutrition Characteristics:  Signs/Symptoms: estimated needs   Nutrition Interventions:  Interventions: Ensure Enlive (each supplement provides 350kcal and 20 grams of protein),Prostat   Radiology Studies: No results found.   Scheduled Meds: . (feeding supplement) PROSource Plus  30 mL Oral Daily  . feeding supplement  237 mL Oral Q24H  . guaiFENesin  1,200 mg Oral BID  . melatonin  3 mg Oral QHS  . polyethylene glycol  17 g Oral BID  . senna-docusate  2 tablet Oral BID   Continuous Infusions: . sodium chloride 10 mL/hr at 06/11/2020 2345  . morphine 1 mg/hr (July 13, 2020 0324)    LOS: 5 days   Kerney Elbe, DO [facility] PAGER is on [place]  If 7PM-7AM, please contact night-coverage [email or web address]

## 2020-06-21 DEATH — deceased

## 2020-08-10 ENCOUNTER — Other Ambulatory Visit: Payer: Self-pay | Admitting: Cardiology

## 2022-10-22 ENCOUNTER — Other Ambulatory Visit (HOSPITAL_BASED_OUTPATIENT_CLINIC_OR_DEPARTMENT_OTHER): Payer: Self-pay
